# Patient Record
Sex: Female | Born: 1935 | Race: White | Hispanic: No | Marital: Married | Smoking: Never smoker
Health system: Southern US, Community
[De-identification: ages and names within clinical notes are randomized; demographics above are authoritative.]

## PROBLEM LIST (undated history)

## (undated) DIAGNOSIS — M1990 Unspecified osteoarthritis, unspecified site: Secondary | ICD-10-CM

## (undated) DIAGNOSIS — I499 Cardiac arrhythmia, unspecified: Secondary | ICD-10-CM

## (undated) DIAGNOSIS — I82409 Acute embolism and thrombosis of unspecified deep veins of unspecified lower extremity: Secondary | ICD-10-CM

## (undated) DIAGNOSIS — Z972 Presence of dental prosthetic device (complete) (partial): Secondary | ICD-10-CM

## (undated) DIAGNOSIS — I639 Cerebral infarction, unspecified: Secondary | ICD-10-CM

## (undated) DIAGNOSIS — F32A Depression, unspecified: Secondary | ICD-10-CM

## (undated) DIAGNOSIS — I509 Heart failure, unspecified: Secondary | ICD-10-CM

## (undated) DIAGNOSIS — R51 Headache: Secondary | ICD-10-CM

## (undated) DIAGNOSIS — Z950 Presence of cardiac pacemaker: Secondary | ICD-10-CM

## (undated) DIAGNOSIS — E7800 Pure hypercholesterolemia, unspecified: Secondary | ICD-10-CM

## (undated) DIAGNOSIS — I2699 Other pulmonary embolism without acute cor pulmonale: Secondary | ICD-10-CM

## (undated) DIAGNOSIS — F329 Major depressive disorder, single episode, unspecified: Secondary | ICD-10-CM

## (undated) DIAGNOSIS — I10 Essential (primary) hypertension: Secondary | ICD-10-CM

## (undated) DIAGNOSIS — K08109 Complete loss of teeth, unspecified cause, unspecified class: Secondary | ICD-10-CM

## (undated) DIAGNOSIS — S72009A Fracture of unspecified part of neck of unspecified femur, initial encounter for closed fracture: Secondary | ICD-10-CM

## (undated) DIAGNOSIS — N390 Urinary tract infection, site not specified: Secondary | ICD-10-CM

## (undated) HISTORY — PX: JOINT REPLACEMENT: SHX530

## (undated) HISTORY — PX: CRANIOTOMY: SHX93

## (undated) HISTORY — PX: CHOLECYSTECTOMY: SHX55

## (undated) HISTORY — PX: ABDOMINAL HYSTERECTOMY: SHX81

## (undated) HISTORY — PX: CARDIAC CATHETERIZATION: SHX172

## (undated) HISTORY — PX: BLADDER SURGERY: SHX569

## (undated) HISTORY — PX: BRAIN SURGERY: SHX531

## (undated) HISTORY — PX: INSERT / REPLACE / REMOVE PACEMAKER: SUR710

---

## 2004-05-16 DIAGNOSIS — I639 Cerebral infarction, unspecified: Secondary | ICD-10-CM

## 2004-05-16 HISTORY — DX: Cerebral infarction, unspecified: I63.9

## 2004-11-17 ENCOUNTER — Ambulatory Visit: Payer: Self-pay | Admitting: Internal Medicine

## 2005-01-29 ENCOUNTER — Emergency Department: Payer: Self-pay | Admitting: Emergency Medicine

## 2005-02-14 ENCOUNTER — Ambulatory Visit: Payer: Self-pay | Admitting: Oncology

## 2005-02-22 ENCOUNTER — Ambulatory Visit: Payer: Self-pay | Admitting: Physical Medicine & Rehabilitation

## 2005-02-22 ENCOUNTER — Inpatient Hospital Stay (HOSPITAL_COMMUNITY)
Admission: RE | Admit: 2005-02-22 | Discharge: 2005-03-03 | Payer: Self-pay | Admitting: Physical Medicine & Rehabilitation

## 2005-03-03 ENCOUNTER — Inpatient Hospital Stay (HOSPITAL_COMMUNITY): Admission: AD | Admit: 2005-03-03 | Discharge: 2005-03-14 | Payer: Self-pay | Admitting: Internal Medicine

## 2005-05-23 ENCOUNTER — Encounter: Payer: Self-pay | Admitting: Internal Medicine

## 2005-06-16 ENCOUNTER — Encounter: Payer: Self-pay | Admitting: Internal Medicine

## 2005-07-14 ENCOUNTER — Encounter: Payer: Self-pay | Admitting: Internal Medicine

## 2005-08-14 ENCOUNTER — Encounter: Payer: Self-pay | Admitting: Internal Medicine

## 2005-09-13 ENCOUNTER — Encounter: Payer: Self-pay | Admitting: Internal Medicine

## 2005-10-14 ENCOUNTER — Encounter: Payer: Self-pay | Admitting: Internal Medicine

## 2006-05-30 ENCOUNTER — Ambulatory Visit: Payer: Self-pay | Admitting: Internal Medicine

## 2006-06-06 ENCOUNTER — Ambulatory Visit: Payer: Self-pay | Admitting: Internal Medicine

## 2006-07-06 ENCOUNTER — Other Ambulatory Visit: Payer: Self-pay

## 2006-07-06 ENCOUNTER — Inpatient Hospital Stay: Payer: Self-pay | Admitting: Internal Medicine

## 2006-10-05 ENCOUNTER — Ambulatory Visit: Payer: Self-pay | Admitting: Internal Medicine

## 2007-02-13 ENCOUNTER — Ambulatory Visit: Payer: Self-pay | Admitting: Family Medicine

## 2007-02-21 ENCOUNTER — Ambulatory Visit: Payer: Self-pay | Admitting: Internal Medicine

## 2007-05-25 ENCOUNTER — Inpatient Hospital Stay: Payer: Self-pay | Admitting: Internal Medicine

## 2007-05-25 ENCOUNTER — Other Ambulatory Visit: Payer: Self-pay

## 2007-06-14 ENCOUNTER — Ambulatory Visit: Payer: Self-pay | Admitting: Internal Medicine

## 2008-01-10 ENCOUNTER — Emergency Department: Payer: Self-pay

## 2008-01-10 ENCOUNTER — Other Ambulatory Visit: Payer: Self-pay

## 2008-06-04 ENCOUNTER — Emergency Department: Payer: Self-pay

## 2008-07-07 ENCOUNTER — Ambulatory Visit: Payer: Self-pay | Admitting: Urology

## 2008-11-14 ENCOUNTER — Ambulatory Visit: Payer: Self-pay | Admitting: Internal Medicine

## 2009-01-06 ENCOUNTER — Inpatient Hospital Stay: Payer: Self-pay | Admitting: *Deleted

## 2009-02-24 ENCOUNTER — Ambulatory Visit: Payer: Self-pay | Admitting: Internal Medicine

## 2009-05-29 ENCOUNTER — Ambulatory Visit: Payer: Self-pay | Admitting: Internal Medicine

## 2009-06-02 ENCOUNTER — Emergency Department: Payer: Self-pay | Admitting: Emergency Medicine

## 2009-11-24 ENCOUNTER — Ambulatory Visit: Payer: Self-pay | Admitting: Internal Medicine

## 2010-12-17 ENCOUNTER — Ambulatory Visit: Payer: Self-pay | Admitting: Internal Medicine

## 2010-12-30 ENCOUNTER — Ambulatory Visit: Payer: Self-pay | Admitting: Emergency Medicine

## 2010-12-30 DIAGNOSIS — Z01810 Encounter for preprocedural cardiovascular examination: Secondary | ICD-10-CM

## 2011-05-12 ENCOUNTER — Ambulatory Visit: Payer: Self-pay | Admitting: Urology

## 2011-05-17 ENCOUNTER — Ambulatory Visit: Payer: Self-pay | Admitting: Internal Medicine

## 2011-05-24 ENCOUNTER — Ambulatory Visit: Payer: Self-pay | Admitting: Urology

## 2011-05-24 DIAGNOSIS — J45909 Unspecified asthma, uncomplicated: Secondary | ICD-10-CM | POA: Diagnosis not present

## 2011-05-24 DIAGNOSIS — N3030 Trigonitis without hematuria: Secondary | ICD-10-CM | POA: Diagnosis not present

## 2011-05-24 DIAGNOSIS — Z7982 Long term (current) use of aspirin: Secondary | ICD-10-CM | POA: Diagnosis not present

## 2011-05-24 DIAGNOSIS — D414 Neoplasm of uncertain behavior of bladder: Secondary | ICD-10-CM | POA: Diagnosis not present

## 2011-05-24 DIAGNOSIS — Z87891 Personal history of nicotine dependence: Secondary | ICD-10-CM | POA: Diagnosis not present

## 2011-05-24 DIAGNOSIS — I498 Other specified cardiac arrhythmias: Secondary | ICD-10-CM | POA: Diagnosis not present

## 2011-05-24 DIAGNOSIS — Z9981 Dependence on supplemental oxygen: Secondary | ICD-10-CM | POA: Diagnosis not present

## 2011-05-24 DIAGNOSIS — M47812 Spondylosis without myelopathy or radiculopathy, cervical region: Secondary | ICD-10-CM | POA: Diagnosis not present

## 2011-05-24 DIAGNOSIS — G43909 Migraine, unspecified, not intractable, without status migrainosus: Secondary | ICD-10-CM | POA: Diagnosis not present

## 2011-05-24 DIAGNOSIS — M545 Low back pain, unspecified: Secondary | ICD-10-CM | POA: Diagnosis not present

## 2011-05-24 DIAGNOSIS — I10 Essential (primary) hypertension: Secondary | ICD-10-CM | POA: Diagnosis not present

## 2011-05-24 DIAGNOSIS — R3915 Urgency of urination: Secondary | ICD-10-CM | POA: Diagnosis not present

## 2011-05-24 DIAGNOSIS — N390 Urinary tract infection, site not specified: Secondary | ICD-10-CM | POA: Diagnosis not present

## 2011-05-24 DIAGNOSIS — R0601 Orthopnea: Secondary | ICD-10-CM | POA: Diagnosis not present

## 2011-05-24 DIAGNOSIS — Z8679 Personal history of other diseases of the circulatory system: Secondary | ICD-10-CM | POA: Diagnosis not present

## 2011-05-24 DIAGNOSIS — Z79899 Other long term (current) drug therapy: Secondary | ICD-10-CM | POA: Diagnosis not present

## 2011-05-27 DIAGNOSIS — J309 Allergic rhinitis, unspecified: Secondary | ICD-10-CM | POA: Diagnosis not present

## 2011-05-27 DIAGNOSIS — J449 Chronic obstructive pulmonary disease, unspecified: Secondary | ICD-10-CM | POA: Diagnosis not present

## 2011-06-09 ENCOUNTER — Inpatient Hospital Stay: Payer: Self-pay | Admitting: Internal Medicine

## 2011-06-09 DIAGNOSIS — R5383 Other fatigue: Secondary | ICD-10-CM | POA: Diagnosis present

## 2011-06-09 DIAGNOSIS — I2699 Other pulmonary embolism without acute cor pulmonale: Secondary | ICD-10-CM | POA: Diagnosis not present

## 2011-06-09 DIAGNOSIS — S72019A Unspecified intracapsular fracture of unspecified femur, initial encounter for closed fracture: Secondary | ICD-10-CM | POA: Diagnosis not present

## 2011-06-09 DIAGNOSIS — N179 Acute kidney failure, unspecified: Secondary | ICD-10-CM | POA: Diagnosis not present

## 2011-06-09 DIAGNOSIS — Z471 Aftercare following joint replacement surgery: Secondary | ICD-10-CM | POA: Diagnosis not present

## 2011-06-09 DIAGNOSIS — S298XXA Other specified injuries of thorax, initial encounter: Secondary | ICD-10-CM | POA: Diagnosis not present

## 2011-06-09 DIAGNOSIS — N390 Urinary tract infection, site not specified: Secondary | ICD-10-CM | POA: Diagnosis not present

## 2011-06-09 DIAGNOSIS — D649 Anemia, unspecified: Secondary | ICD-10-CM | POA: Diagnosis not present

## 2011-06-09 DIAGNOSIS — R6889 Other general symptoms and signs: Secondary | ICD-10-CM | POA: Diagnosis not present

## 2011-06-09 DIAGNOSIS — J45909 Unspecified asthma, uncomplicated: Secondary | ICD-10-CM | POA: Diagnosis not present

## 2011-06-09 DIAGNOSIS — I10 Essential (primary) hypertension: Secondary | ICD-10-CM | POA: Diagnosis not present

## 2011-06-09 DIAGNOSIS — Z515 Encounter for palliative care: Secondary | ICD-10-CM | POA: Diagnosis not present

## 2011-06-09 DIAGNOSIS — Z86718 Personal history of other venous thrombosis and embolism: Secondary | ICD-10-CM | POA: Diagnosis not present

## 2011-06-09 DIAGNOSIS — J9819 Other pulmonary collapse: Secondary | ICD-10-CM | POA: Diagnosis not present

## 2011-06-09 DIAGNOSIS — R112 Nausea with vomiting, unspecified: Secondary | ICD-10-CM | POA: Diagnosis not present

## 2011-06-09 DIAGNOSIS — A4902 Methicillin resistant Staphylococcus aureus infection, unspecified site: Secondary | ICD-10-CM | POA: Diagnosis present

## 2011-06-09 DIAGNOSIS — Z96649 Presence of unspecified artificial hip joint: Secondary | ICD-10-CM | POA: Diagnosis not present

## 2011-06-09 DIAGNOSIS — N269 Renal sclerosis, unspecified: Secondary | ICD-10-CM | POA: Diagnosis not present

## 2011-06-09 DIAGNOSIS — I69928 Other speech and language deficits following unspecified cerebrovascular disease: Secondary | ICD-10-CM | POA: Diagnosis not present

## 2011-06-09 DIAGNOSIS — B952 Enterococcus as the cause of diseases classified elsewhere: Secondary | ICD-10-CM | POA: Diagnosis present

## 2011-06-09 DIAGNOSIS — Z01818 Encounter for other preprocedural examination: Secondary | ICD-10-CM | POA: Diagnosis not present

## 2011-06-09 DIAGNOSIS — J9600 Acute respiratory failure, unspecified whether with hypoxia or hypercapnia: Secondary | ICD-10-CM | POA: Diagnosis not present

## 2011-06-09 DIAGNOSIS — D72829 Elevated white blood cell count, unspecified: Secondary | ICD-10-CM | POA: Diagnosis present

## 2011-06-09 DIAGNOSIS — I509 Heart failure, unspecified: Secondary | ICD-10-CM | POA: Diagnosis present

## 2011-06-09 DIAGNOSIS — S72009A Fracture of unspecified part of neck of unspecified femur, initial encounter for closed fracture: Secondary | ICD-10-CM | POA: Diagnosis not present

## 2011-06-09 DIAGNOSIS — E785 Hyperlipidemia, unspecified: Secondary | ICD-10-CM | POA: Diagnosis not present

## 2011-06-09 DIAGNOSIS — F411 Generalized anxiety disorder: Secondary | ICD-10-CM | POA: Diagnosis present

## 2011-06-09 DIAGNOSIS — Z7982 Long term (current) use of aspirin: Secondary | ICD-10-CM | POA: Diagnosis not present

## 2011-06-09 DIAGNOSIS — R0902 Hypoxemia: Secondary | ICD-10-CM | POA: Diagnosis not present

## 2011-06-09 DIAGNOSIS — R000 Tachycardia, unspecified: Secondary | ICD-10-CM | POA: Diagnosis not present

## 2011-06-09 DIAGNOSIS — J189 Pneumonia, unspecified organism: Secondary | ICD-10-CM | POA: Diagnosis not present

## 2011-06-09 DIAGNOSIS — R0602 Shortness of breath: Secondary | ICD-10-CM | POA: Diagnosis not present

## 2011-06-09 DIAGNOSIS — F329 Major depressive disorder, single episode, unspecified: Secondary | ICD-10-CM | POA: Diagnosis present

## 2011-06-09 DIAGNOSIS — I498 Other specified cardiac arrhythmias: Secondary | ICD-10-CM | POA: Diagnosis present

## 2011-06-09 DIAGNOSIS — S72033A Displaced midcervical fracture of unspecified femur, initial encounter for closed fracture: Secondary | ICD-10-CM | POA: Diagnosis not present

## 2011-06-09 DIAGNOSIS — I69998 Other sequelae following unspecified cerebrovascular disease: Secondary | ICD-10-CM | POA: Diagnosis not present

## 2011-06-09 DIAGNOSIS — D500 Iron deficiency anemia secondary to blood loss (chronic): Secondary | ICD-10-CM | POA: Diagnosis present

## 2011-06-09 LAB — CBC
HCT: 39 % (ref 35.0–47.0)
HGB: 12.9 g/dL (ref 12.0–16.0)
MCH: 26.5 pg (ref 26.0–34.0)
MCHC: 33.1 g/dL (ref 32.0–36.0)

## 2011-06-09 LAB — URINALYSIS, COMPLETE
Bilirubin,UR: NEGATIVE
Ketone: NEGATIVE
Ph: 5 (ref 4.5–8.0)
Protein: NEGATIVE
RBC,UR: 31 /HPF (ref 0–5)
WBC UR: 176 /HPF (ref 0–5)

## 2011-06-09 LAB — COMPREHENSIVE METABOLIC PANEL
Albumin: 4.1 g/dL (ref 3.4–5.0)
Alkaline Phosphatase: 74 U/L (ref 50–136)
Bilirubin,Total: 0.7 mg/dL (ref 0.2–1.0)
Glucose: 130 mg/dL — ABNORMAL HIGH (ref 65–99)
SGOT(AST): 25 U/L (ref 15–37)
SGPT (ALT): 14 U/L
Total Protein: 7.2 g/dL (ref 6.4–8.2)

## 2011-06-09 LAB — APTT: Activated PTT: 27.5 secs (ref 23.6–35.9)

## 2011-06-10 LAB — CBC WITH DIFFERENTIAL/PLATELET
Basophil #: 0 10*3/uL (ref 0.0–0.1)
Basophil %: 0.3 %
Eosinophil #: 0.2 10*3/uL (ref 0.0–0.7)
HGB: 11.4 g/dL — ABNORMAL LOW (ref 12.0–16.0)
Lymphocyte %: 10.3 %
MCH: 26.3 pg (ref 26.0–34.0)
MCHC: 33 g/dL (ref 32.0–36.0)
Monocyte #: 0.3 10*3/uL (ref 0.0–0.7)
Neutrophil #: 7.2 10*3/uL — ABNORMAL HIGH (ref 1.4–6.5)
Neutrophil %: 83.4 %
RDW: 13.9 % (ref 11.5–14.5)

## 2011-06-10 LAB — BASIC METABOLIC PANEL
Anion Gap: 9 (ref 7–16)
BUN: 13 mg/dL (ref 7–18)
Calcium, Total: 8.4 mg/dL — ABNORMAL LOW (ref 8.5–10.1)
EGFR (African American): >60
EGFR (Non-African Amer.): >60
Glucose: 129 mg/dL — ABNORMAL HIGH (ref 65–99)
Osmolality: 279 (ref 275–301)
Potassium: 4.4 mmol/L (ref 3.5–5.1)

## 2011-06-10 LAB — HEMOGLOBIN A1C: Hemoglobin A1C: 5.5 % (ref 4.2–6.3)

## 2011-06-11 DIAGNOSIS — R000 Tachycardia, unspecified: Secondary | ICD-10-CM

## 2011-06-11 LAB — COMPREHENSIVE METABOLIC PANEL
Anion Gap: 9 (ref 7–16)
Calcium, Total: 8 mg/dL — ABNORMAL LOW (ref 8.5–10.1)
Chloride: 100 mmol/L (ref 98–107)
Co2: 27 mmol/L (ref 21–32)
EGFR (African American): 51 — ABNORMAL LOW
Potassium: 4.3 mmol/L (ref 3.5–5.1)
SGOT(AST): 29 U/L (ref 15–37)
SGPT (ALT): 11 U/L — ABNORMAL LOW
Sodium: 136 mmol/L (ref 136–145)

## 2011-06-11 LAB — CBC WITH DIFFERENTIAL/PLATELET
Eosinophil %: 5.8 %
HCT: 30.4 % — ABNORMAL LOW (ref 35.0–47.0)
Lymphocyte #: 0.6 10*3/uL — ABNORMAL LOW (ref 1.0–3.6)
MCV: 81 fL (ref 80–100)
Monocyte %: 4.5 %
Neutrophil #: 6.7 10*3/uL — ABNORMAL HIGH (ref 1.4–6.5)
RBC: 3.74 10*6/uL — ABNORMAL LOW (ref 3.80–5.20)
RDW: 13.5 % (ref 11.5–14.5)
WBC: 8.2 10*3/uL (ref 3.6–11.0)

## 2011-06-11 LAB — BASIC METABOLIC PANEL
Anion Gap: 8 (ref 7–16)
BUN: 14 mg/dL (ref 7–18)
Co2: 27 mmol/L (ref 21–32)
Creatinine: 1.08 mg/dL (ref 0.60–1.30)
EGFR (African American): >60
EGFR (Non-African Amer.): 52 — ABNORMAL LOW
Potassium: 4.4 mmol/L (ref 3.5–5.1)
Sodium: 139 mmol/L (ref 136–145)

## 2011-06-11 LAB — CK: CK, Total: 380 U/L — ABNORMAL HIGH (ref 21–215)

## 2011-06-12 LAB — BASIC METABOLIC PANEL
BUN: 17 mg/dL (ref 7–18)
EGFR (Non-African Amer.): 44 — ABNORMAL LOW
Glucose: 119 mg/dL — ABNORMAL HIGH (ref 65–99)
Potassium: 3.9 mmol/L (ref 3.5–5.1)
Sodium: 139 mmol/L (ref 136–145)

## 2011-06-12 LAB — TROPONIN I
Troponin-I: 0.25 ng/mL — ABNORMAL HIGH
Troponin-I: 0.33 ng/mL — ABNORMAL HIGH

## 2011-06-12 LAB — HEMOGLOBIN: HGB: 10.5 g/dL — ABNORMAL LOW (ref 12.0–16.0)

## 2011-06-13 LAB — BASIC METABOLIC PANEL
Anion Gap: 12 (ref 7–16)
Calcium, Total: 8.5 mg/dL (ref 8.5–10.1)
EGFR (African American): 32 — ABNORMAL LOW
EGFR (Non-African Amer.): 27 — ABNORMAL LOW
Glucose: 125 mg/dL — ABNORMAL HIGH (ref 65–99)
Osmolality: 293 (ref 275–301)
Sodium: 142 mmol/L (ref 136–145)

## 2011-06-13 LAB — CBC WITH DIFFERENTIAL/PLATELET
Basophil %: 0.1 %
HCT: 28.2 % — ABNORMAL LOW (ref 35.0–47.0)
HGB: 9.3 g/dL — ABNORMAL LOW (ref 12.0–16.0)
Lymphocyte #: 0.8 10*3/uL — ABNORMAL LOW (ref 1.0–3.6)
MCH: 26.6 pg (ref 26.0–34.0)
MCHC: 33.1 g/dL (ref 32.0–36.0)
MCV: 80 fL (ref 80–100)
Monocyte #: 0.8 10*3/uL — ABNORMAL HIGH (ref 0.0–0.7)
Neutrophil #: 11 10*3/uL — ABNORMAL HIGH (ref 1.4–6.5)
RDW: 13.4 % (ref 11.5–14.5)

## 2011-06-13 LAB — APTT: Activated PTT: >160 secs (ref 23.6–35.9)

## 2011-06-13 LAB — URINE CULTURE

## 2011-06-13 LAB — MAGNESIUM: Magnesium: 1.7 mg/dL — ABNORMAL LOW

## 2011-06-14 ENCOUNTER — Encounter (HOSPITAL_COMMUNITY): Payer: Self-pay | Admitting: *Deleted

## 2011-06-14 ENCOUNTER — Inpatient Hospital Stay (HOSPITAL_COMMUNITY)
Admission: AD | Admit: 2011-06-14 | Discharge: 2011-06-23 | DRG: 175 | Disposition: A | Discharge status: Skilled Nursing Facility | Payer: Medicare Other | Source: Other Acute Inpatient Hospital | Attending: Internal Medicine | Admitting: Internal Medicine

## 2011-06-14 DIAGNOSIS — B952 Enterococcus as the cause of diseases classified elsewhere: Secondary | ICD-10-CM | POA: Diagnosis present

## 2011-06-14 DIAGNOSIS — S7291XA Unspecified fracture of right femur, initial encounter for closed fracture: Secondary | ICD-10-CM | POA: Diagnosis present

## 2011-06-14 DIAGNOSIS — Z90710 Acquired absence of both cervix and uterus: Secondary | ICD-10-CM | POA: Diagnosis not present

## 2011-06-14 DIAGNOSIS — Z7901 Long term (current) use of anticoagulants: Secondary | ICD-10-CM

## 2011-06-14 DIAGNOSIS — S72033A Displaced midcervical fracture of unspecified femur, initial encounter for closed fracture: Secondary | ICD-10-CM | POA: Diagnosis not present

## 2011-06-14 DIAGNOSIS — N390 Urinary tract infection, site not specified: Secondary | ICD-10-CM | POA: Diagnosis present

## 2011-06-14 DIAGNOSIS — J189 Pneumonia, unspecified organism: Secondary | ICD-10-CM | POA: Diagnosis present

## 2011-06-14 DIAGNOSIS — E538 Deficiency of other specified B group vitamins: Secondary | ICD-10-CM | POA: Diagnosis present

## 2011-06-14 DIAGNOSIS — Z86718 Personal history of other venous thrombosis and embolism: Secondary | ICD-10-CM | POA: Diagnosis not present

## 2011-06-14 DIAGNOSIS — S72009A Fracture of unspecified part of neck of unspecified femur, initial encounter for closed fracture: Secondary | ICD-10-CM | POA: Diagnosis not present

## 2011-06-14 DIAGNOSIS — R0902 Hypoxemia: Secondary | ICD-10-CM | POA: Diagnosis present

## 2011-06-14 DIAGNOSIS — M25559 Pain in unspecified hip: Secondary | ICD-10-CM | POA: Diagnosis not present

## 2011-06-14 DIAGNOSIS — E876 Hypokalemia: Secondary | ICD-10-CM | POA: Diagnosis not present

## 2011-06-14 DIAGNOSIS — Z471 Aftercare following joint replacement surgery: Secondary | ICD-10-CM | POA: Diagnosis not present

## 2011-06-14 DIAGNOSIS — G9341 Metabolic encephalopathy: Secondary | ICD-10-CM | POA: Diagnosis present

## 2011-06-14 DIAGNOSIS — Z882 Allergy status to sulfonamides status: Secondary | ICD-10-CM

## 2011-06-14 DIAGNOSIS — J9600 Acute respiratory failure, unspecified whether with hypoxia or hypercapnia: Secondary | ICD-10-CM | POA: Diagnosis not present

## 2011-06-14 DIAGNOSIS — N179 Acute kidney failure, unspecified: Secondary | ICD-10-CM | POA: Diagnosis not present

## 2011-06-14 DIAGNOSIS — Z886 Allergy status to analgesic agent status: Secondary | ICD-10-CM

## 2011-06-14 DIAGNOSIS — D62 Acute posthemorrhagic anemia: Secondary | ICD-10-CM | POA: Diagnosis not present

## 2011-06-14 DIAGNOSIS — I69998 Other sequelae following unspecified cerebrovascular disease: Secondary | ICD-10-CM

## 2011-06-14 DIAGNOSIS — Z8781 Personal history of (healed) traumatic fracture: Secondary | ICD-10-CM | POA: Diagnosis not present

## 2011-06-14 DIAGNOSIS — I69928 Other speech and language deficits following unspecified cerebrovascular disease: Secondary | ICD-10-CM

## 2011-06-14 DIAGNOSIS — Z9981 Dependence on supplemental oxygen: Secondary | ICD-10-CM | POA: Diagnosis not present

## 2011-06-14 DIAGNOSIS — R5381 Other malaise: Secondary | ICD-10-CM | POA: Diagnosis present

## 2011-06-14 DIAGNOSIS — Z96649 Presence of unspecified artificial hip joint: Secondary | ICD-10-CM | POA: Diagnosis not present

## 2011-06-14 DIAGNOSIS — D638 Anemia in other chronic diseases classified elsewhere: Secondary | ICD-10-CM | POA: Diagnosis present

## 2011-06-14 DIAGNOSIS — R609 Edema, unspecified: Secondary | ICD-10-CM | POA: Diagnosis not present

## 2011-06-14 DIAGNOSIS — D649 Anemia, unspecified: Secondary | ICD-10-CM | POA: Diagnosis not present

## 2011-06-14 DIAGNOSIS — Z5189 Encounter for other specified aftercare: Secondary | ICD-10-CM | POA: Diagnosis not present

## 2011-06-14 DIAGNOSIS — I2789 Other specified pulmonary heart diseases: Secondary | ICD-10-CM | POA: Diagnosis present

## 2011-06-14 DIAGNOSIS — Z9889 Other specified postprocedural states: Secondary | ICD-10-CM

## 2011-06-14 DIAGNOSIS — I2699 Other pulmonary embolism without acute cor pulmonale: Principal | ICD-10-CM | POA: Diagnosis present

## 2011-06-14 DIAGNOSIS — T4275XA Adverse effect of unspecified antiepileptic and sedative-hypnotic drugs, initial encounter: Secondary | ICD-10-CM | POA: Diagnosis present

## 2011-06-14 DIAGNOSIS — I319 Disease of pericardium, unspecified: Secondary | ICD-10-CM | POA: Diagnosis not present

## 2011-06-14 DIAGNOSIS — J4540 Moderate persistent asthma, uncomplicated: Secondary | ICD-10-CM

## 2011-06-14 DIAGNOSIS — R4182 Altered mental status, unspecified: Secondary | ICD-10-CM | POA: Diagnosis present

## 2011-06-14 DIAGNOSIS — Z9181 History of falling: Secondary | ICD-10-CM | POA: Diagnosis not present

## 2011-06-14 DIAGNOSIS — I517 Cardiomegaly: Secondary | ICD-10-CM | POA: Diagnosis not present

## 2011-06-14 DIAGNOSIS — N269 Renal sclerosis, unspecified: Secondary | ICD-10-CM | POA: Diagnosis not present

## 2011-06-14 DIAGNOSIS — Z515 Encounter for palliative care: Secondary | ICD-10-CM | POA: Diagnosis not present

## 2011-06-14 DIAGNOSIS — I509 Heart failure, unspecified: Secondary | ICD-10-CM | POA: Diagnosis not present

## 2011-06-14 DIAGNOSIS — G319 Degenerative disease of nervous system, unspecified: Secondary | ICD-10-CM | POA: Diagnosis not present

## 2011-06-14 DIAGNOSIS — J4520 Mild intermittent asthma, uncomplicated: Secondary | ICD-10-CM

## 2011-06-14 DIAGNOSIS — I10 Essential (primary) hypertension: Secondary | ICD-10-CM | POA: Diagnosis not present

## 2011-06-14 DIAGNOSIS — J9691 Respiratory failure, unspecified with hypoxia: Secondary | ICD-10-CM | POA: Diagnosis present

## 2011-06-14 HISTORY — DX: Heart failure, unspecified: I50.9

## 2011-06-14 HISTORY — DX: Headache: R51

## 2011-06-14 HISTORY — DX: Acute embolism and thrombosis of unspecified deep veins of unspecified lower extremity: I82.409

## 2011-06-14 HISTORY — DX: Depression, unspecified: F32.A

## 2011-06-14 HISTORY — DX: Other pulmonary embolism without acute cor pulmonale: I26.99

## 2011-06-14 HISTORY — DX: Cerebral infarction, unspecified: I63.9

## 2011-06-14 HISTORY — DX: Major depressive disorder, single episode, unspecified: F32.9

## 2011-06-14 LAB — APTT: Activated PTT: >160 secs (ref 23.6–35.9)

## 2011-06-14 LAB — CBC WITH DIFFERENTIAL/PLATELET
Basophil #: 0 10*3/uL (ref 0.0–0.1)
Eosinophil #: 0 10*3/uL (ref 0.0–0.7)
Eosinophil #: 0 10*3/uL (ref 0.0–0.7)
Eosinophil %: 0.3 %
HCT: 22 % — ABNORMAL LOW (ref 35.0–47.0)
HCT: 19.8 % — ABNORMAL LOW (ref 35.0–47.0)
HGB: 6.6 g/dL — ABNORMAL LOW (ref 12.0–16.0)
Lymphocyte #: 0.5 10*3/uL — ABNORMAL LOW (ref 1.0–3.6)
Lymphocyte #: 0.9 10*3/uL — ABNORMAL LOW (ref 1.0–3.6)
MCH: 26.9 pg (ref 26.0–34.0)
MCHC: 32.9 g/dL (ref 32.0–36.0)
MCHC: 33.2 g/dL (ref 32.0–36.0)
MCV: 82 fL (ref 80–100)
MCV: 81 fL (ref 80–100)
Monocyte #: 0.3 10*3/uL (ref 0.0–0.7)
Monocyte #: 0.5 10*3/uL (ref 0.0–0.7)
Monocyte %: 4.3 %
Neutrophil #: 9.2 10*3/uL — ABNORMAL HIGH (ref 1.4–6.5)
Neutrophil #: 9.2 10*3/uL — ABNORMAL HIGH (ref 1.4–6.5)
Neutrophil %: 86.6 %
Platelet: 232 10*3/uL (ref 150–440)
RBC: 2.44 10*6/uL — ABNORMAL LOW (ref 3.80–5.20)
RDW: 13.1 % (ref 11.5–14.5)
RDW: 13.5 % (ref 11.5–14.5)
WBC: 10.7 10*3/uL (ref 3.6–11.0)

## 2011-06-14 LAB — CBC
HCT: 23.7 % — ABNORMAL LOW (ref 36.0–46.0)
Hemoglobin: 8.2 g/dL — ABNORMAL LOW (ref 12.0–15.0)
MCV: 79.3 fL (ref 78.0–100.0)
RBC: 2.99 MIL/uL — ABNORMAL LOW (ref 3.87–5.11)
WBC: 12.1 10*3/uL — ABNORMAL HIGH (ref 4.0–10.5)

## 2011-06-14 LAB — PROTIME-INR: INR: 1.37 (ref 0.00–1.49)

## 2011-06-14 LAB — BASIC METABOLIC PANEL
Anion Gap: 13 (ref 7–16)
BUN: 59 mg/dL — ABNORMAL HIGH (ref 7–18)
Chloride: 100 mmol/L (ref 98–107)
Osmolality: 296 (ref 275–301)
Potassium: 4.3 mmol/L (ref 3.5–5.1)

## 2011-06-14 LAB — PATHOLOGY REPORT

## 2011-06-14 LAB — MRSA PCR SCREENING: MRSA by PCR: POSITIVE — AB

## 2011-06-14 LAB — COMPREHENSIVE METABOLIC PANEL
AST: 89 U/L — ABNORMAL HIGH (ref 0–37)
Albumin: 2.1 g/dL — ABNORMAL LOW (ref 3.5–5.2)
Alkaline Phosphatase: 39 U/L (ref 39–117)
Chloride: 95 mEq/L — ABNORMAL LOW (ref 96–112)
Potassium: 3.9 mEq/L (ref 3.5–5.1)
Total Bilirubin: 0.5 mg/dL (ref 0.3–1.2)

## 2011-06-14 LAB — PHOSPHORUS: Phosphorus: 4.1 mg/dL (ref 2.3–4.6)

## 2011-06-14 LAB — HEMOGLOBIN A1C: Hemoglobin A1C: 5.5 % (ref 4.2–6.3)

## 2011-06-14 MED ORDER — MUPIROCIN 2 % EX OINT
1.0000 "application " | TOPICAL_OINTMENT | Freq: Two times a day (BID) | CUTANEOUS | Status: AC
  Administered 2011-06-15 – 2011-06-19 (×10): 1 via NASAL
  Filled 2011-06-14: qty 22

## 2011-06-14 MED ORDER — ACETAMINOPHEN 650 MG RE SUPP: 650.0000 mg | Freq: Four times a day (QID) | RECTAL | Status: DC | PRN

## 2011-06-14 MED ORDER — ACETAMINOPHEN 325 MG PO TABS
650.0000 mg | ORAL_TABLET | Freq: Four times a day (QID) | ORAL | Status: DC | PRN
  Administered 2011-06-15 – 2011-06-23 (×15): 650 mg via ORAL
  Filled 2011-06-14 (×15): qty 2

## 2011-06-14 MED ORDER — MONTELUKAST SODIUM 10 MG PO TABS
10.0000 mg | ORAL_TABLET | Freq: Every day | ORAL | Status: DC
  Administered 2011-06-14: 10 mg via ORAL
  Filled 2011-06-14 (×7): qty 1

## 2011-06-14 MED ORDER — CHLORHEXIDINE GLUCONATE CLOTH 2 % EX PADS
6.0000 | MEDICATED_PAD | Freq: Every day | CUTANEOUS | Status: AC
  Administered 2011-06-15 – 2011-06-18 (×4): 6 via TOPICAL

## 2011-06-14 MED ORDER — SODIUM CHLORIDE 0.9 % IV SOLN
INTRAVENOUS | Status: DC
  Administered 2011-06-14 – 2011-06-15 (×3): via INTRAVENOUS
  Administered 2011-06-16: 1000 mL via INTRAVENOUS
  Administered 2011-06-17 – 2011-06-18 (×2): via INTRAVENOUS
  Administered 2011-06-18: 75 mL/h via INTRAVENOUS

## 2011-06-14 MED ORDER — PANTOPRAZOLE SODIUM 40 MG PO TBEC
40.0000 mg | DELAYED_RELEASE_TABLET | Freq: Every day | ORAL | Status: DC
  Administered 2011-06-15: 40 mg via ORAL
  Filled 2011-06-14: qty 1

## 2011-06-14 MED ORDER — ALBUTEROL SULFATE HFA 108 (90 BASE) MCG/ACT IN AERS: 2.0000 | INHALATION_SPRAY | Freq: Four times a day (QID) | RESPIRATORY_TRACT | Status: DC | PRN

## 2011-06-14 MED ORDER — ONDANSETRON HCL 4 MG/2ML IJ SOLN: 4.0000 mg | Freq: Four times a day (QID) | INTRAMUSCULAR | Status: DC | PRN

## 2011-06-14 MED ORDER — ENOXAPARIN SODIUM 30 MG/0.3ML ~~LOC~~ SOLN
30.0000 mg | Freq: Every day | SUBCUTANEOUS | Status: DC
  Administered 2011-06-15: 30 mg via SUBCUTANEOUS
  Filled 2011-06-14: qty 0.3

## 2011-06-14 MED ORDER — PHENAZOPYRIDINE HCL 200 MG PO TABS
200.0000 mg | ORAL_TABLET | Freq: Three times a day (TID) | ORAL | Status: DC
  Administered 2011-06-14 – 2011-06-15 (×2): 200 mg via ORAL
  Filled 2011-06-14 (×4): qty 1

## 2011-06-14 MED ORDER — ONDANSETRON HCL 4 MG PO TABS: 4.0000 mg | ORAL_TABLET | Freq: Four times a day (QID) | ORAL | Status: DC | PRN

## 2011-06-14 NOTE — Progress Notes (Addendum)
ANTIBIOTIC CONSULT NOTE - INITIAL  Pharmacy Consult for Vanco/Zosyn/Levaquin Indication: pneumonia/UTI  Allergies  Allergen Reactions  . Morphine And Related Other (See Comments)    unknown  . Sulfur Other (See Comments)    unknown  . Vicodin (Hydrocodone-Acetaminophen) Other (See Comments)    unknown    Patient Measurements: Ht ~ 65 inches Wt 83.9 kg  Vital Signs: Temp: 98.3 F (36.8 C) (01/29 2100) Temp src: Oral (01/29 2100) BP: 109/41 mmHg (01/29 2300) Pulse Rate: 67  (01/29 2300) Intake/Output from previous day:   Intake/Output from this shift:    Labs:  Basename 06/14/11 2228  WBC 12.1*  HGB 8.2*  PLT 273  LABCREA --  CREATININE 1.74*   Estimated CrCl ~30 ml/min  Microbiology: Recent Results (from the past 720 hour(s))  MRSA PCR SCREENING     Status: Abnormal   Collection Time   06/14/11  8:20 PM      Component Value Range Status Comment   MRSA by PCR POSITIVE (*) NEGATIVE  Final    Culture from [place]: 1/24 Urine - Enterococcus faecalis sensitive to amp/zyvox/nitrofurantoin/tigecycline and MRSA sensitive to Vanco/Zyvox/Nitrofurantoin/Tetracycline/Tigecycline/Gent/Bactrim  Medical History: Past Medical History  Diagnosis Date  . Asthma   . Headache   . PE (pulmonary embolism)   . DVT (deep venous thrombosis)   . Depression   . CHF (congestive heart failure)   . On home oxygen therapy   . Stroke   h/o hemorrhagic CVA requiring craniotomy  Medications:  Prescriptions prior to admission  Medication Sig Dispense Refill  . albuterol (PROVENTIL HFA;VENTOLIN HFA) 108 (90 BASE) MCG/ACT inhaler Inhale 2 puffs into the lungs every 6 (six) hours as needed. For shortness of breath      . amLODipine (NORVASC) 2.5 MG tablet Take 2.5 mg by mouth daily.      Marland Kitchen atorvastatin (LIPITOR) 10 MG tablet Take 10 mg by mouth daily.      . ciprofloxacin (CIPRO) 500 MG tablet Take 500 mg by mouth 2 (two) times daily.      Marland Kitchen FLUoxetine (PROZAC) 20 MG capsule Take 20  mg by mouth daily.      Marland Kitchen HYDROcodone-acetaminophen (VICODIN) 5-500 MG per tablet Take 1 tablet by mouth every 6 (six) hours as needed. For pain      . irbesartan-hydrochlorothiazide (AVALIDE) 300-12.5 MG per tablet Take 1 tablet by mouth daily.      . montelukast (SINGULAIR) 10 MG tablet Take 10 mg by mouth at bedtime.      . phenazopyridine (PYRIDIUM) 200 MG tablet Take 200 mg by mouth 3 (three) times daily.      . Trospium Chloride (SANCTURA XR) 60 MG CP24 Take 1 capsule by mouth daily.      Marland Kitchen zolpidem (AMBIEN) 10 MG tablet Take 10 mg by mouth at bedtime as needed. For anxiety/insomnia       Assessment: 78 y.o. female transferred from [place]. Records from [place] indicate pt admitted s/p fall with R femur neck fracture, underwent hemiarthroplasty 1/25. CT scan on 1/26 shows PE- received heparin in [place] ICU but stopped before transfer to [facility].  Pt also with acute renal failure. Urine culture are [place] with enterococcus faecalis and MRSA.  S400poke with [place] pharmacist - Pt was on Zosyn, Tigecycline, Vanco, and Levaquin there since 1/28. Last dose of Vanco 750mg  q24h was given 1/29 at 1400. Last dose of Levaquin given 1/28 a.m.  Goal of Therapy:  Vancomycin 15-20 mcg/ml  Plan:  1. Zosyn 3.375gm IV q8h. 2.  Vancomycin 750mg  IV q24h. Next dose due 1/30 at 1400. 3. Levaquin 750mg  IV q48h. Next dose 1/30 a.m. 4. Will f/u blood cultures, renal function, and vancomycin trough at [facility].  Lavonia Dana 06/14/2011,11:31 PM

## 2011-06-14 NOTE — Progress Notes (Signed)
PCP:   Provider Not In System   Chief Complaint:  Transferred from [facility] as per family request and for higher level of care.  HPI: Most of the history available from the patient family at bedside and paperwork from [facility]. Pt is a poor historian, she is confused and is not oriented to place and time, but she is able to answer a few simple questions. 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 CHF, asthma, tripped and fell in a parking lot, and was initially seen at [facility] on 1/24. She was found to have right femur neck fracture . She was medically cleared and underwent right hemiarthroplasty on 1/25. On 1/26, pt developed respiratory distress and went into acute hypoxic respiratory failure, she underwent ct angiogram of the chest which showed bilateral pulmonary emboli, bilateral infiltrates and bilateral pleural effusions. She was transferred to ICU, and was started on anticoagulation. Meanwhile she developed MRSA  UTI, acute renal failure, and she went into SVT, with troponin leak.  Her respiratory status did not improve and she was maintained on venti mask till 1/29 and anticoagulation was stopped, thinking she might have fat embolism in view of her femur fracture and hemiarthroplasty.  As per the family and notes , pt got demerol via IV route on the night of surgery and since then her mental status has worsened.  As per family request and for higher level of care she was transferred to [facility]. Dr Lavinia Sharps( MD at [facility]) initially called Dr Allen Kell to accept the transfer, but patient was deferred to hospitalist service to take over the patient care for further evaluation and management.    Review of Systems:  Could not be obtained as pt is a poor historian. .  Past Medical History: Past Medical History  Diagnosis Date  . Asthma   . Headache   . PE (pulmonary  embolism)   . DVT (deep venous thrombosis)   . Depression   . CHF (congestive heart failure)   . On home oxygen therapy   . Stroke    Past Surgical History  Procedure Date  . Abdominal hysterectomy   . Craniotomy   . Bladder surgery     Medications: Prior to Admission medications   Medication Sig Start Date End Date Taking? Authorizing Provider  albuterol (PROVENTIL HFA;VENTOLIN HFA) 108 (90 BASE) MCG/ACT inhaler Inhale 2 puffs into the lungs every 6 (six) hours as needed. For shortness of breath   Yes Historical Provider, MD  amLODipine (NORVASC) 2.5 MG tablet Take 2.5 mg by mouth daily.   Yes Historical Provider, MD  atorvastatin (LIPITOR) 10 MG tablet Take 10 mg by mouth daily.   Yes Historical Provider, MD  ciprofloxacin (CIPRO) 500 MG tablet Take 500 mg by mouth 2 (two) times daily.   Yes Historical Provider, MD  FLUoxetine (PROZAC) 20 MG capsule Take 20 mg by mouth daily.   Yes Historical Provider, MD  HYDROcodone-acetaminophen (VICODIN) 5-500 MG per tablet Take 1 tablet by mouth every 6 (six) hours as needed. For pain   Yes Historical Provider, MD  irbesartan-hydrochlorothiazide (AVALIDE) 300-12.5 MG per tablet Take 1 tablet by mouth daily.   Yes Historical Provider, MD  montelukast (SINGULAIR) 10 MG tablet Take 10 mg by mouth at bedtime.   Yes Historical Provider, MD  phenazopyridine (PYRIDIUM) 200 MG tablet Take 200 mg by mouth 3 (three) times daily.   Yes Historical Provider,  MD  Trospium Chloride (SANCTURA XR) 60 MG CP24 Take 1 capsule by mouth daily.   Yes Historical Provider, MD  zolpidem (AMBIEN) 10 MG tablet Take 10 mg by mouth at bedtime as needed. For anxiety/insomnia   Yes Historical Provider, MD    Allergies:   Allergies  Allergen Reactions  . Morphine And Related Other (See Comments)    unknown  . Sulfur Other (See Comments)    unknown  . Vicodin (Hydrocodone-Acetaminophen) Other (See Comments)    unknown    Social History:  does not have a smoking  history on file. She does not have any smokeless tobacco history on file. She reports that she does not drink alcohol or use illicit drugs. .  Family History: No family history on file.  Physical Exam: Filed Vitals:   06/14/11 2100  BP: 103/41  Pulse: 67  Temp: 98.3 F (36.8 C)  TempSrc: Oral  Resp: 21  SpO2: 96%   Constitutional: Vital signs reviewed.  Patient is a well-developed and well-nourished in no acute distress and cooperative with exam. Alert but confused. She is not oriented to place or time. She is able to recognize her family members and answer simple question and follow simple commands.  Head: Normocephalic and atraumatic Mouth: no erythema or exudates, dry mucous membranes Eyes: PERRL , conjunctivae normal, No scleral icterus.  Neck: Supple, Trachea midline normal ROM, No JVD, Cardiovascular: RRR, S1 normal, S2 normal, no MRG, Pulmonary/Chest: decreased air entry at bases and scattered rhonchi heard. No wheezing heard.  Abdominal: Soft. Non-tender, non-distended, bowel sounds are normal, no masses, organomegaly, or guarding present.  Musculoskeletal: RLL wrapped in bandage. LLE  No pedal edema.  Neurological: alert  But confused,  Complete neuro exam could not be performed secondary to pt's mental status.  Skin: Warm, dry and intact. No rash, cyanosis, or clubbing.        Labs on Admission:  No results found for this basename: NA:2,K:2,CL:2,CO2:2,GLUCOSE:2,BUN:2,CREATININE:2,CALCIUM:2,MG:2,PHOS:2 in the last 72 hours No results found for this basename: AST:2,ALT:2,ALKPHOS:2,BILITOT:2,PROT:2,ALBUMIN:2 in the last 72 hours No results found for this basename: LIPASE:2,AMYLASE:2 in the last 72 hours No results found for this basename: WBC:2,NEUTROABS:2,HGB:2,HCT:2,MCV:2,PLT:2 in the last 72 hours No results found for this basename: CKTOTAL:3,CKMB:3,CKMBINDEX:3,TROPONINI:3 in the last 72 hours No results found for this basename: TSH,T4TOTAL,FREET3,T3FREE,THYROIDAB in  the last 72 hours No results found for this basename: VITAMINB12:2,FOLATE:2,FERRITIN:2,TIBC:2,IRON:2,RETICCTPCT:2 in the last 72 hours  Radiological Exams on Admission: No results found.  Assessment/Plan Present on Admission:  .Altered mental status: most likely secondary to metabolic encephalopathy vs secondary to sedative medications. Will get a CT head without contrast, and if there is no acute pathology, will call neurology as needed. Avoid sedatives.  .Pulmonary embolism vs Fat embolism: Currently not on anticoagulation, will get a ct head without contrast, if there is no indication of bleeding,  Will restart her on iv anticoagulation. Venous dopplers of the lower extremities negative She is currently saturating 96% on 3 liters of nasal canula oxygen.  Wynonia Hazard Respiratory failure with hypoxia: secondary to a combination of pulmonary embolism, bilateral pneumonia, and pulmonary edema. Started the patient on broad spectrum antibiotics. Will repeat CXR  In am.  .Pneumonia: treatment as per #2.  Marland KitchenAcute renal failure: most likely contrast induced vs pre renal secondary to dehydration and infection, but will get a renal ultrasound to evaluate for any obstructive causes. Daily I& O's , UA ordered. Will call renal consult , if  Renal function doesn't improve.  .Femur fracture, right: s/p  hemiarthroplasty.  . Enterococcus / MRSA UTI (lower urinary tract infection): started the patient on IV vancomycin and iv zosyn as per the sensitivities.  Anemia: secondary to combination of blood loss anemia from surgery and chronic disease. Will get anemia panel and stool for occult blood.  Pulmonary Edema/ h/o SVT/ Troponin Leak: Questionable h/o CHF, with unknown left ventricular EF. 2D echocardiogram ordered to evaluate the right heart strain and rule out PFO. 12 lead EKG ordered, she appears to be in sinus on tele monitoring.   Further recommendations as per her clinical course  She is full code as per family      Time spent on this patient including examination and decision-making process: 85 minutes.  Zadrian Mccauley [phone] 06/14/2011, 10:03 PM

## 2011-06-15 ENCOUNTER — Inpatient Hospital Stay (HOSPITAL_COMMUNITY): Payer: Medicare Other

## 2011-06-15 ENCOUNTER — Encounter (HOSPITAL_COMMUNITY): Payer: Self-pay | Admitting: *Deleted

## 2011-06-15 DIAGNOSIS — I69998 Other sequelae following unspecified cerebrovascular disease: Secondary | ICD-10-CM | POA: Diagnosis not present

## 2011-06-15 DIAGNOSIS — R4182 Altered mental status, unspecified: Secondary | ICD-10-CM | POA: Diagnosis not present

## 2011-06-15 DIAGNOSIS — I509 Heart failure, unspecified: Secondary | ICD-10-CM | POA: Diagnosis not present

## 2011-06-15 DIAGNOSIS — I319 Disease of pericardium, unspecified: Secondary | ICD-10-CM

## 2011-06-15 DIAGNOSIS — I517 Cardiomegaly: Secondary | ICD-10-CM | POA: Diagnosis not present

## 2011-06-15 DIAGNOSIS — R5381 Other malaise: Secondary | ICD-10-CM | POA: Diagnosis not present

## 2011-06-15 DIAGNOSIS — S72009A Fracture of unspecified part of neck of unspecified femur, initial encounter for closed fracture: Secondary | ICD-10-CM | POA: Diagnosis not present

## 2011-06-15 DIAGNOSIS — N179 Acute kidney failure, unspecified: Secondary | ICD-10-CM | POA: Diagnosis not present

## 2011-06-15 DIAGNOSIS — G319 Degenerative disease of nervous system, unspecified: Secondary | ICD-10-CM | POA: Diagnosis not present

## 2011-06-15 LAB — BASIC METABOLIC PANEL
BUN: 87 mg/dL — ABNORMAL HIGH (ref 6–23)
Chloride: 99 mEq/L (ref 96–112)
GFR calc Af Amer: 33 mL/min — ABNORMAL LOW (ref >90)
Glucose, Bld: 145 mg/dL — ABNORMAL HIGH (ref 70–99)
Potassium: 4 mEq/L (ref 3.5–5.1)

## 2011-06-15 LAB — CARDIAC PANEL(CRET KIN+CKTOT+MB+TROPI)
CK, MB: 2.5 ng/mL (ref 0.3–4.0)
CK, MB: 2.3 ng/mL (ref 0.3–4.0)
Relative Index: 0.8 (ref 0.0–2.5)
Total CK: 264 U/L — ABNORMAL HIGH (ref 7–177)
Troponin I: <0.3 ng/mL (ref <0.30)
Troponin I: <0.3 ng/mL (ref <0.30)

## 2011-06-15 LAB — LACTIC ACID, PLASMA: Lactic Acid, Venous: 1.6 mmol/L (ref 0.5–2.2)

## 2011-06-15 LAB — URINE MICROSCOPIC-ADD ON

## 2011-06-15 LAB — FOLATE: Folate: 4.7 ng/mL

## 2011-06-15 LAB — CBC
HCT: 20.7 % — ABNORMAL LOW (ref 36.0–46.0)
Hemoglobin: 7.2 g/dL — ABNORMAL LOW (ref 12.0–15.0)
MCHC: 34.8 g/dL (ref 30.0–36.0)
RBC: 2.67 MIL/uL — ABNORMAL LOW (ref 3.87–5.11)

## 2011-06-15 LAB — IRON AND TIBC
Iron: 41 ug/dL — ABNORMAL LOW (ref 42–135)
Saturation Ratios: 19 % — ABNORMAL LOW (ref 20–55)
TIBC: 219 ug/dL — ABNORMAL LOW (ref 250–470)

## 2011-06-15 LAB — URINALYSIS, ROUTINE W REFLEX MICROSCOPIC
Glucose, UA: NEGATIVE mg/dL
Specific Gravity, Urine: 1.019 (ref 1.005–1.030)
pH: 6 (ref 5.0–8.0)

## 2011-06-15 LAB — OSMOLALITY, URINE: Osmolality, Ur: 527 mOsm/kg (ref 390–1090)

## 2011-06-15 LAB — VITAMIN B12: Vitamin B-12: 190 pg/mL — ABNORMAL LOW (ref 211–911)

## 2011-06-15 LAB — PROCALCITONIN: Procalcitonin: 0.99 ng/mL

## 2011-06-15 LAB — RETICULOCYTES: Retic Ct Pct: 3.9 % — ABNORMAL HIGH (ref 0.4–3.1)

## 2011-06-15 LAB — TSH: TSH: 2.356 u[IU]/mL (ref 0.350–4.500)

## 2011-06-15 MED ORDER — POLYETHYLENE GLYCOL 3350 17 G PO PACK
17.0000 g | PACK | Freq: Two times a day (BID) | ORAL | Status: DC
  Administered 2011-06-15 – 2011-06-16 (×2): 17 g via ORAL
  Filled 2011-06-15 (×17): qty 1

## 2011-06-15 MED ORDER — CYANOCOBALAMIN 1000 MCG/ML IJ SOLN
1000.0000 ug | Freq: Every day | INTRAMUSCULAR | Status: AC
  Administered 2011-06-15 – 2011-06-21 (×7): 1000 ug via SUBCUTANEOUS
  Filled 2011-06-15 (×11): qty 1

## 2011-06-15 MED ORDER — PIPERACILLIN-TAZOBACTAM 3.375 G IVPB
3.3750 g | Freq: Three times a day (TID) | INTRAVENOUS | Status: DC
  Administered 2011-06-15 (×3): 3.375 g via INTRAVENOUS
  Filled 2011-06-15 (×5): qty 50

## 2011-06-15 MED ORDER — BIOTENE DRY MOUTH MT LIQD
15.0000 mL | Freq: Two times a day (BID) | OROMUCOSAL | Status: DC
  Administered 2011-06-15 – 2011-06-20 (×13): 15 mL via OROMUCOSAL

## 2011-06-15 MED ORDER — VANCOMYCIN HCL 1000 MG IV SOLR
750.0000 mg | INTRAVENOUS | Status: DC
  Administered 2011-06-15 – 2011-06-16 (×2): 750 mg via INTRAVENOUS
  Filled 2011-06-15 (×3): qty 750

## 2011-06-15 MED ORDER — TRAMADOL HCL 50 MG PO TABS
50.0000 mg | ORAL_TABLET | Freq: Once | ORAL | Status: AC
  Administered 2011-06-15: 50 mg via ORAL
  Filled 2011-06-15: qty 1

## 2011-06-15 MED ORDER — SODIUM CHLORIDE 0.9 % IJ SOLN
10.0000 mL | INTRAMUSCULAR | Status: DC | PRN
  Administered 2011-06-20: 10 mL

## 2011-06-15 MED ORDER — HEPARIN SOD (PORCINE) IN D5W 100 UNIT/ML IV SOLN
1050.0000 [IU]/h | INTRAVENOUS | Status: DC
  Administered 2011-06-15: 1200 [IU]/h via INTRAVENOUS
  Administered 2011-06-16: 1050 [IU]/h via INTRAVENOUS
  Filled 2011-06-15 (×5): qty 250

## 2011-06-15 MED ORDER — SENNA 8.6 MG PO TABS
1.0000 | ORAL_TABLET | Freq: Two times a day (BID) | ORAL | Status: DC
  Administered 2011-06-15 – 2011-06-21 (×6): 8.6 mg via ORAL
  Filled 2011-06-15 (×18): qty 1

## 2011-06-15 MED ORDER — BISACODYL 10 MG RE SUPP: 10.0000 mg | Freq: Every day | RECTAL | Status: DC | PRN

## 2011-06-15 MED ORDER — TRAMADOL HCL 50 MG PO TABS
50.0000 mg | ORAL_TABLET | Freq: Four times a day (QID) | ORAL | Status: DC | PRN
  Administered 2011-06-15: 50 mg via ORAL
  Filled 2011-06-15: qty 1

## 2011-06-15 MED ORDER — LEVOFLOXACIN IN D5W 750 MG/150ML IV SOLN
750.0000 mg | INTRAVENOUS | Status: DC
  Administered 2011-06-15: 750 mg via INTRAVENOUS
  Filled 2011-06-15: qty 150

## 2011-06-15 MED ORDER — OXYCODONE HCL 5 MG PO TABS
5.0000 mg | ORAL_TABLET | ORAL | Status: DC | PRN
  Administered 2011-06-15 – 2011-06-23 (×32): 5 mg via ORAL
  Filled 2011-06-15 (×32): qty 1

## 2011-06-15 NOTE — Progress Notes (Signed)
ANTICOAGULATION CONSULT NOTE - Follow Up Consult  Pharmacy Consult for heparin  Indication: pulmonary embolus  Allergies  Allergen Reactions  . Morphine And Related Other (See Comments)    unknown  . Sulfur Other (See Comments)    unknown  . Vicodin (Hydrocodone-Acetaminophen) Other (See Comments)    unknown    Patient Measurements: Height: 5\' 5"  (165.1 cm) Weight: 184 lb 15.5 oz (83.9 kg) IBW/kg (Calculated) : 57  Heparin Dosing Weight: 68kg  Vital Signs: Temp: 97.1 F (36.2 C) (01/30 2040) Temp src: Oral (01/30 2040) BP: 119/51 mmHg (01/30 2029) Pulse Rate: 73  (01/30 2029)  Labs:  Basename 06/15/11 2054 06/15/11 1516 06/15/11 0655 06/15/11 0011 06/14/11 2228  HGB -- -- 7.2* -- 8.2*  HCT -- -- 20.7* -- 23.7*  PLT -- -- 264 -- 273  APTT -- -- -- -- 32  LABPROT -- -- -- -- 17.1*  INR -- -- -- -- 1.37  HEPARINUNFRC 0.84* -- -- -- --  CREATININE -- -- 1.67* -- 1.74*  CKTOTAL -- 237* 264* 312* --  CKMB -- 2.3 2.5 2.6 --  TROPONINI -- <0.30 <0.30 <0.30 --   Estimated Creatinine Clearance: 30.2 ml/min (by C-G formula based on Cr of 1.67).   Medications:  Heparin infusion at 1200 units/hr  Assessment: 78 yo who has a h/o hemorrhagic CVA/DVT/recent femur fx w/ hemiarthroplasty was tx from [facility] for further care escalation. Patient is currently on heparin infusion for PE. Heparin level supratherapeutic.   Goal of Therapy:  Heparin level 0.3-0.7 units/ml   Plan:  Decrease heparin rate to 1050 units/hr F/u am heparin level   Riki Rusk 06/15/2011,10:18 PM

## 2011-06-15 NOTE — Progress Notes (Addendum)
ANTICOAGULATION CONSULT NOTE - Initial Consult  Pharmacy Consult for heparin Indication: pulmonary embolus  Allergies  Allergen Reactions  . Morphine And Related Other (See Comments)    unknown  . Sulfur Other (See Comments)    unknown  . Vicodin (Hydrocodone-Acetaminophen) Other (See Comments)    unknown    Patient Measurements: Height: 5\' 5"  (165.1 cm) Weight: 184 lb 15.5 oz (83.9 kg) IBW/kg (Calculated) : 57   Vital Signs: Temp: 97.5 F (36.4 C) (01/30 1158) Temp src: Oral (01/30 1158) BP: 130/58 mmHg (01/30 1158) Pulse Rate: 77  (01/30 1158)  Labs:  Basename 06/15/11 0655 06/15/11 0011 06/14/11 2228  HGB 7.2* -- 8.2*  HCT 20.7* -- 23.7*  PLT 264 -- 273  APTT -- -- 32  LABPROT -- -- 17.1*  INR -- -- 1.37  HEPARINUNFRC -- -- --  CREATININE 1.67* -- 1.74*  CKTOTAL 264* 312* --  CKMB 2.5 2.6 --  TROPONINI <0.30 <0.30 --   Estimated Creatinine Clearance: 30.2 ml/min (by C-G formula based on Cr of 1.67).  Medical History: Past Medical History  Diagnosis Date  . Asthma   . Headache   . PE (pulmonary embolism)   . DVT (deep venous thrombosis)   . Depression   . CHF (congestive heart failure)   . On home oxygen therapy   . Stroke     Assessment: 78 yo who has a h/o hemorrhagic CVA/DVT/recent femur fx w/ hemiarthroplasty was tx from [facility] for further care escalation. She developed a PE during her stay at [facility] and was started on IV heparin. It was then stopped before the tx here to [facility]. Questions were raised whether this is a PE vs fat embolism. Anticoag was not started at [facility] until her head CT can be evaluated. The CT came back neg for bleed so heparin has been ordered to be resumed. Since a dose of lovenox was given this AM at 0900, will not give a bolus  Goal of Therapy:  Heparin level 0.3-0.7 units/ml   Plan:  1. Heparin drip at 1200 units/hr 2. Check 8 hr heparin level  Ulyses Southward [place] 06/15/2011,12:27 PM

## 2011-06-15 NOTE — Progress Notes (Signed)
No allergic rx noted to oxycodone and pain relief achieved.  IV team here now and PICC line being placed.

## 2011-06-15 NOTE — Progress Notes (Signed)
Pt had Echocardiogram done at bedside.  Tolerated without problems.  Now taken to ultrasound for Renal US then to go for CXR.  Pt family updated.  Order for pt to go off monitor without RN.  Pt taken in bed.

## 2011-06-15 NOTE — Progress Notes (Signed)
  Echocardiogram 2D Echocardiogram has been performed.  Cathie Beams Deneen 06/15/2011, 10:41 AM

## 2011-06-15 NOTE — Progress Notes (Signed)
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 CHF, asthma, tripped and fell in a parking lot, and was initially seen at [facility] on 1/24. She was found to have right femur neck fracture . She was medically cleared and underwent right hemiarthroplasty on 1/25. On 1/26, pt developed respiratory distress and went into acute hypoxic respiratory failure, she underwent ct angiogram of the chest which showed bilateral pulmonary emboli, bilateral infiltrates and bilateral pleural effusions. She was transferred to ICU, and was started on anticoagulation. Meanwhile she developed MRSA UTI, acute renal failure, and she went into SVT, with troponin leak. Her respiratory status did not improve and she was maintained on venti mask till 1/29.  Her anticoagulation was stopped, thinking she might have fat embolism in view of her femur fracture and hemiarthroplasty.  At the present time the patient is alert but somewhat confused.  She c/o pain "all over," but particularly worse in the R hip.  She denies chest heaviness or sob.  I have updated her extended family on her clinical condition with her permission.  I have completed an extensive review of her records from [facility].    Objective: Weight change:   Intake/Output Summary (Last 24 hours) at 06/15/11 1359 Last data filed at 06/15/11 1302  Gross per 24 hour  Intake   1680 ml  Output    550 ml  Net   1130 ml   Blood pressure 130/58, pulse 77, temperature 97.5 F (36.4 C), temperature source Oral, resp. rate 32, height 5\' 5"  (1.651 m), weight 83.9 kg (184 lb 15.5 oz), SpO2 96.00%.  Physical Exam: General: No acute respiratory distress Lungs: Clear to auscultation bilaterally with exception to bibasilar crackles - no wheeze Cardiovascular: Regular rate and rhythm without murmur gallop or rub  normal - distant bs Abdomen: Nontender, nondistended, soft, bowel sounds positive, no rebound, no ascites, no appreciable mass Extremities: No significant cyanosis or clubbing, R lateral hip wound dressed and dry, 1+ B LE edema  Neuro:  Alert but confused, moves all 4 ext, CNII-XII intact B  Lab Results:  [facility] 06/15/11 0655 06/14/11 2228  NA 135 131*  K 4.0 3.9  CL 99 95*  CO2 24 24  GLUCOSE 145* 143*  BUN 87* 75*  CREATININE 1.67* 1.74*  CALCIUM 8.7 8.7  MG -- 2.7*  PHOS -- 4.1    Basename 06/14/11 2228  AST 89*  ALT 62*  ALKPHOS 39  BILITOT 0.5  PROT 5.5*  ALBUMIN 2.1*    Basename 06/15/11 0655 06/14/11 2228  WBC 12.9* 12.1*  NEUTROABS -- --  HGB 7.2* 8.2*  HCT 20.7* 23.7*  MCV 77.5* 79.3  PLT 264 273    Basename 06/15/11 0655 06/15/11 0011  CKTOTAL 264* 312*  CKMB 2.5 2.6  CKMBINDEX -- --  TROPONINI <0.30 <0.30   Micro Results: Recent Results (from the past 240 hour(s))  MRSA PCR SCREENING     Status: Abnormal   Collection Time   06/14/11  8:20 PM      Component Value Range Status Comment   MRSA by PCR POSITIVE (*) NEGATIVE  Final     Studies/Results: All recent x-ray/radiology reports have been reviewed in detail.   Medications: I have reviewed the patient's complete medication list.  Assessment/Plan:  Pulmonary embolism Will resume heparin, given CT of head which reveals no evidence of ICH -  at this point I feel we must tx as if this is clot and not fat - will watch Hgb closely on anticoag - the only potential tx for fat emboli is steroids (not well proven) and I do not feel that steroids would be wise given her multiple drug resistant infections at present   R femoral neck fx s/p fall S/p surgical correction at [facility] - partial weight bearing per Orthopedist at [facility] - consult local Ortho MD if any complications are encountered during rehab process  Altered mental status Likely multifactorial, with contributions from sedating  meds, TME related to UTI, severe B12 deficiency, and sundowning - tx each individual issue and follow for improvement in sx  Acute hypoxic resp failure  resp status appears to be stabilizing at present - f/u CXR today suggests no evidence of a focal infiltrate - I do not feel that abx for pna are indicated   ?Pneumonia - Healthcare Acquired (not at [facility] on admit) As discussed above, CXR and exam are not consistent with an active pulmonary infection - I will d/c antibiotics directed at pulmonary organisms and follow clinically   Acute renal failure crt has improved slightly since admit - no acute findings on renal US - crt peaked at ~1.97   MRSA and enterococcus UTI Will cont vanc alone for now and recheck UA/cx and sensitivities  Acute blood loss anemia (surgery) + probable chronic anemia  May require further transfusion - will have to follow Hgb closely on heparin gtt  SVT/elevated troponin Stable at the present time - assure lytes are balanced - watch on tele   Severe B12 deficiency Begin daily B12 SQ for 7 day load   Lonia Blood, MD [facility] Office  [phone] Pager [phone]  On-Call/Text Page:      [email or web address]      password [facility]

## 2011-06-15 NOTE — Progress Notes (Signed)
Pt c/o increasing pain in R hip after turning and bathing.  Given Tylenol with minimal change in pain level.  Dr. Sharon Seller notified and Ultram ordered and given.  Again, minimal relief noted and pain increased to an 8 after turning.  Discussed with Dr. Sharon Seller and family.  Order for oxycodone received, Dr. Sharon Seller aware of Morphine allergy and discussed with family.  Pt was taking vicodin in past with no allergic rx.  Oxycodone given and will monitor for allergy and decreased pain.

## 2011-06-16 DIAGNOSIS — I10 Essential (primary) hypertension: Secondary | ICD-10-CM | POA: Diagnosis not present

## 2011-06-16 DIAGNOSIS — R5381 Other malaise: Secondary | ICD-10-CM | POA: Diagnosis not present

## 2011-06-16 DIAGNOSIS — R4182 Altered mental status, unspecified: Secondary | ICD-10-CM | POA: Diagnosis not present

## 2011-06-16 DIAGNOSIS — D62 Acute posthemorrhagic anemia: Secondary | ICD-10-CM | POA: Diagnosis not present

## 2011-06-16 LAB — URINE CULTURE
Colony Count: NO GROWTH
Culture  Setup Time: 201301301616
Culture: NO GROWTH

## 2011-06-16 LAB — CBC
Hemoglobin: 6.1 g/dL — CL (ref 12.0–15.0)
MCH: 26.5 pg (ref 26.0–34.0)
MCHC: 33.2 g/dL (ref 30.0–36.0)
MCHC: 34.6 g/dL (ref 30.0–36.0)
Platelets: 268 10*3/uL (ref 150–400)
RDW: 14.5 % (ref 11.5–15.5)
RDW: 14.2 % (ref 11.5–15.5)
WBC: 14.6 10*3/uL — ABNORMAL HIGH (ref 4.0–10.5)

## 2011-06-16 LAB — COMPREHENSIVE METABOLIC PANEL
ALT: 55 U/L — ABNORMAL HIGH (ref 0–35)
AST: 44 U/L — ABNORMAL HIGH (ref 0–37)
Albumin: 1.9 g/dL — ABNORMAL LOW (ref 3.5–5.2)
Alkaline Phosphatase: 30 U/L — ABNORMAL LOW (ref 39–117)
Calcium: 8.1 mg/dL — ABNORMAL LOW (ref 8.4–10.5)
Potassium: 3.8 mEq/L (ref 3.5–5.1)
Sodium: 135 mEq/L (ref 135–145)
Total Protein: 4.6 g/dL — ABNORMAL LOW (ref 6.0–8.3)

## 2011-06-16 LAB — PREPARE RBC (CROSSMATCH)

## 2011-06-16 LAB — HEPARIN LEVEL (UNFRACTIONATED): Heparin Unfractionated: 0.67 IU/mL (ref 0.30–0.70)

## 2011-06-16 NOTE — Progress Notes (Signed)
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 CHF, asthma, tripped and fell in a parking lot, and was initially seen at [facility] on 1/24. She was found to have right femur neck fracture . She was medically cleared and underwent right hemiarthroplasty on 1/25. On 1/26, pt developed respiratory distress and went into acute hypoxic respiratory failure, she underwent ct angiogram of the chest which showed bilateral pulmonary emboli, bilateral infiltrates and bilateral pleural effusions. She was transferred to ICU, and was started on anticoagulation. Meanwhile she developed MRSA UTI, acute renal failure, and she went into SVT, with troponin leak. Her respiratory status did not improve and she was maintained on venti mask till 1/29.  Her anticoagulation was stopped, thinking she might have fat embolism in view of her femur fracture and hemiarthroplasty.  She appears quite weak today. Pain in hip is controlled with medication. She has no complaints. She understands I will be transfusion blood today. I have discussed her current situation with the 2 daughters who are in the room.   Objective: Weight change: -1.3 kg (-2 lb 13.9 oz)  Intake/Output Summary (Last 24 hours) at 06/16/11 1534 Last data filed at 06/16/11 1500  Gross per 24 hour  Intake 3482.38 ml  Output   1350 ml  Net 2132.38 ml   Blood pressure 137/46, pulse 64, temperature 97.8 F (36.6 C), temperature source Oral, resp. rate 21, height 5\' 5"  (1.651 m), weight 82.6 kg (182 lb 1.6 oz), SpO2 99.00%.  Physical Exam: General: No acute respiratory distress Lungs: Clear to auscultation bilaterally with exception to bibasilar crackles - no wheeze Cardiovascular: Regular rate and rhythm without murmur gallop or rub normal - distant bs Abdomen: Nontender, nondistended, soft, bowel sounds  positive, no rebound, no ascites, no appreciable mass Extremities: No significant cyanosis or clubbing, R lateral hip wound dressed and dry, left thigh larger than right and quite tight but no distinct hematoma noted, no edema in lower legs/ankles. Neuro:  Alert, sleepy, moves all 4 ext, CNII-XII intact B  Lab Results:  Basename 06/16/11 0500 06/15/11 0655 06/14/11 2228  NA 135 135 131*  K 3.8 4.0 3.9  CL 102 99 95*  CO2 26 24 24   GLUCOSE 117* 145* 143*  BUN 68* 87* 75*  CREATININE 1.12* 1.67* 1.74*  CALCIUM 8.1* 8.7 8.7  MG -- -- 2.7*  PHOS -- -- 4.1    Basename 06/16/11 0500 06/14/11 2228  AST 44* 89*  ALT 55* 62*  ALKPHOS 30* 39  BILITOT 0.4 0.5  PROT 4.6* 5.5*  ALBUMIN 1.9* 2.1*    Basename 06/16/11 0500 06/15/11 0655 06/14/11 2228  WBC 12.0* 12.9* 12.1*  NEUTROABS -- -- --  HGB 6.1* 7.2* 8.2*  HCT 18.4* 20.7* 23.7*  MCV 80.0 77.5* 79.3  PLT 268 264 273    Basename 06/15/11 1516 06/15/11 0655 06/15/11 0011  CKTOTAL 237* 264* 312*  CKMB 2.3 2.5 2.6  CKMBINDEX -- -- --  TROPONINI <0.30 <0.30 <0.30   Micro Results: Recent Results (from the past 240 hour(s))  MRSA PCR SCREENING     Status: Abnormal   Collection Time   06/14/11  8:20 PM      Component Value Range Status Comment   MRSA by PCR POSITIVE (*) NEGATIVE  Final   CULTURE, BLOOD (ROUTINE X 2)     Status: Normal (Preliminary result)   Collection  Time   06/15/11 12:00 AM      Component Value Range Status Comment   Specimen Description BLOOD RIGHT HAND   Final    Special Requests BOTTLES DRAWN AEROBIC AND ANAEROBIC 10CC   Final    Culture  Setup Time [id number]   Final    Culture     Final    Value:        BLOOD CULTURE RECEIVED NO GROWTH TO DATE CULTURE WILL BE HELD FOR 5 DAYS BEFORE ISSUING A FINAL NEGATIVE REPORT   Report Status PENDING   Incomplete   CULTURE, BLOOD (ROUTINE X 2)     Status: Normal (Preliminary result)   Collection Time   06/15/11 12:11 AM      Component Value Range Status Comment     Specimen Description BLOOD RIGHT ARM   Final    Special Requests BOTTLES DRAWN AEROBIC AND ANAEROBIC 10CC   Final    Culture  Setup Time [id number]   Final    Culture     Final    Value:        BLOOD CULTURE RECEIVED NO GROWTH TO DATE CULTURE WILL BE HELD FOR 5 DAYS BEFORE ISSUING A FINAL NEGATIVE REPORT   Report Status PENDING   Incomplete   URINE CULTURE     Status: Normal   Collection Time   06/15/11  3:51 PM      Component Value Range Status Comment   Specimen Description URINE, CATHETERIZED   Final    Special Requests NONE   Final    Culture  Setup Time [id number]   Final    Colony Count NO GROWTH   Final    Culture NO GROWTH   Final    Report Status 06/16/2011 FINAL   Final     Studies/Results: All recent x-ray/radiology reports have been reviewed in detail.   Medications: I have reviewed the patient's complete medication list.  Assessment/Plan:  Pulmonary embolism Continue heparin - will watch Hgb closely on anticoag - On 3L of O2 currently- pulm status is stable.  Mod pulm HTN without RV failure noted on ECHO.  Anemia- due to acute blood loss into left thigh? Transfuse 2 more units now and follow hemoglobin carefully.  The heparin may be resulting in ongoing oozing into the thigh. Will request that RN start measuring her thigh circumference.  R femoral neck fx s/p fall S/p surgical correction at [facility] - partial weight bearing per Orthopedist at [facility] - will consult local Ortho MD if any complications are encountered during rehab process  Altered mental status Likely multifactorial, with contributions from sedating meds, TME related to UTI, severe B12 deficiency, and sundowning - tx each individual issue and follow for improvement in sx  Acute hypoxic resp failure  resp status appears to be stabilizing at present - f/u CXR today suggests no evidence of a focal infiltrate - I do not feel that abx for pna are indicated   ?Pneumonia - Healthcare  Acquired (not at [facility] on admit) As discussed above, CXR and exam are not consistent with an active pulmonary infection -antibiotics for PNA discontinued.  Acute renal failure crt continues to improve - no acute findings on renal US - crt peaked at ~1.97   MRSA and enterococcus UTI Will cont vanc alone for now and recheck UA/cx and sensitivities  Acute blood loss anemia (surgery) + probable chronic anemia  May require further transfusion - will have to follow Hgb closely on heparin gtt  SVT/elevated troponin Stable at the present time - assure lytes are balanced - watch on tele   Severe B12 deficiency - daily B12 SQ for 7 day load   H/o hemorrhagic CVA with right sided weakness and speech abnormalities.   Full Code  Calvert Cantor, MD [facility] Office  [phone] Pager [phone]  On-Call/Text Page:      [email or web address]      password [facility]

## 2011-06-16 NOTE — Evaluation (Signed)
Physical Therapy Evaluation Patient Details Name: Michele Summers MRN: [id number] DOB: 17-Oct-1931 Today's Date: 06/16/2011  Problem List:  Patient Active Problem List  Diagnoses  . Altered mental status  . Pulmonary embolism  . Respiratory failure with hypoxia  . Pneumonia  . Acute renal failure  . Femur fracture, right  . Asthma  . UTI (lower urinary tract infection)    Past Medical History:  Past Medical History  Diagnosis Date  . Asthma   . Headache   . PE (pulmonary embolism)   . DVT (deep venous thrombosis)   . Depression   . CHF (congestive heart failure)   . On home oxygen therapy   . Stroke    Past Surgical History:  Past Surgical History  Procedure Date  . Abdominal hysterectomy   . Craniotomy   . 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 CHF, asthma, tripped and fell in a parking lot, and was initially seen at [facility] on 1/24. She was found to have right femur neck fracture . She was medically cleared and underwent right hemiarthroplasty on 1/25. On 1/26, pt developed respiratory distress and went into acute hypoxic respiratory failure, she underwent ct angiogram of the chest which showed bilateral pulmonary emboli, bilateral infiltrates and bilateral pleural effusions. She was transferred to ICU, and was started on anticoagulation. Meanwhile she developed MRSA UTI, acute renal failure, and she went into SVT, with troponin leak. Her respiratory status did not improve and she was maintained on venti mask till 1/29.  Her anticoagulation was stopped, thinking she might have fat embolism in view of her femur fracture and hemiarthroplasty.   PT/OT co-eval completed today. Pt demonstrating decreased ROM, strength, and general mobility as well as the following problem  list. Pt will benefit from physical therapy in the acute setting to maximize mobility and restore prior level of function in an effort to decrease her BOC and length of stay at next venue. Rec SNF for follow up therapies. Educated family members and nursing on the need for daily AAROM (AP, QS, HS and ABD/ADD) to improve strength and ROM. PT Recommendation/Assessment: Patient will need skilled PT in the acute care venue PT Problem List: Decreased strength;Decreased range of motion;Decreased activity tolerance;Decreased balance;Decreased cognition;Decreased coordination;Decreased mobility;Decreased knowledge of use of DME;Decreased safety awareness;Decreased knowledge of precautions;Cardiopulmonary status limiting activity;Pain;Obesity PT Therapy Diagnosis : Difficulty walking;Abnormality of gait;Generalized weakness;Acute pain PT Plan PT Frequency: Min 3X/week PT Treatment/Interventions: DME instruction;Gait training;Functional mobility training;Stair training;Therapeutic exercise;Therapeutic activities;Balance training;Neuromuscular re-education;Patient/family education PT Recommendation Follow Up Recommendations: Skilled nursing facility and daily AAROM to right hip  Equipment Recommended: Defer to next venue PT Goals  Acute Rehab PT Goals PT Goal Formulation: With patient Time For Goal Achievement: 2 weeks Pt will Roll Supine to Right Side: with mod assist PT Goal: Rolling Supine to Right Side - Progress: Goal set today Pt will Roll Supine to Left Side: with mod assist PT Goal: Rolling Supine to Left Side - Progress: Goal set today Pt will go Supine/Side to Sit: with mod assist PT Goal: Supine/Side to Sit - Progress: Goal set today Pt will Sit at Edge of Bed: with mod assist;3-5 min;with unilateral upper extremity support PT Goal: Sit at Edge Of Bed - Progress: Goal set today Pt will go Sit to Supine/Side: with mod assist PT Goal: Sit to Supine/Side -  Progress: Goal set today Pt will go Sit  to Stand: with upper extremity assist;with mod assist PT Goal: Sit to Stand - Progress: Goal set today Pt will go Stand to Sit: with mod assist PT Goal: Stand to Sit - Progress: Goal set today Pt will Transfer Bed to Chair/Chair to Bed: with mod assist PT Transfer Goal: Bed to Chair/Chair to Bed - Progress: Goal set today Pt will Stand: with mod assist;1 - 2 min;with bilateral upper extremity support PT Goal: Stand - Progress: Goal set today Pt will Ambulate: 1 - 15 feet;with modified independence;with least restrictive assistive device PT Goal: Ambulate - Progress: Goal set today Pt will Perform Home Exercise Program: with supervision, verbal cues required/provided PT Goal: Perform Home Exercise Program - Progress: Goal set today  PT Evaluation Precautions/Restrictions  Precautions Precautions: Fall Precaution Comments: recent Rt hip hemi Restrictions Weight Bearing Restrictions: Yes RLE Weight Bearing: Partial weight bearing Prior Functioning  Home Living Home Adaptive Equipment: None Prior Function Level of Independence: Independent with basic ADLs;Independent with homemaking with ambulation;Independent with gait Cognition Cognition Arousal/Alertness: Awake/alert Overall Cognitive Status: Appears within functional limits for tasks assessed Orientation Level: Oriented X4 Sensation/Coordination Sensation Light Touch: Appears Intact Stereognosis: Not tested Hot/Cold: Not tested Proprioception: Not tested Coordination Gross Motor Movements are Fluid and Coordinated: No Fine Motor Movements are Fluid and Coordinated: No Coordination and Movement Description: Impaired secondary to weakness Extremity Assessment RUE Assessment RUE Assessment: Exceptions to [facility]) (defer to OT) RUE Strength RUE Overall Strength Comments: shoulder 2+/5; elbow and distally 3+/5 LUE Assessment LUE Assessment:  (defer to OT) LUE Strength LUE Overall Strength Comments: shoulder 2+/5; elbow and  distally 3+/5 RLE Assessment RLE Assessment:  (grossly 2-/5) RLE Strength RLE Overall Strength Comments: Pt with very strong hold into adducted hip position but able to abduct with much facilitation (grossly 10-15 degrees of abduction); performed heels slides with maybe 30 degrees of knee flexion and only with active assist; pts ankle inverted and planter flexed corrects to grossly 5 degrees from neutral and pt demonstrating grossly 2-/t strength LLE Assessment LLE Assessment:  (grossly 3/5) Mobility (including Balance) Bed Mobility Bed Mobility: Yes Supine to Sit: 1: +2 Total assist (0%) Supine to Sit Details (indicate cue type and reason): +3totalpt0%; because of significant pain and fatigue pt not able to perform this today Sit to Supine: 1: +2 Total assist;HOB flat (0%) Scooting to HOB: 1: +2 Total assist (0%) Transfers Transfers: Yes Sit to Stand: 1: +2 Total assist;From bed;From elevated surface (10%) Sit to Stand Details (indicate cue type and reason): attempted sit->stand x1 but pt not extending through legs/trunk at all; was able to translate anteriorly but not able to follow through to stand so sat immediately Ambulation/Gait Ambulation/Gait: No Stairs: No  Balance Balance Assessed: Yes Static Sitting Balance Static Sitting - Balance Support: Bilateral upper extremity supported Static Sitting - Level of Assistance: 4: Min assist Static Sitting - Comment/# of Minutes: initally pt maxA to sit EOB with both LE/UE supported; with cueing for anterior translation pt able to bring self forward and place her hands on my shoulders; very weak upper extremities (see OT note) ; maybe able to sit unsupported with SBA 5 seconds at a time; tends toward posterior lean Exercise  Total Joint Exercises Ankle Circles/Pumps: AAROM;Both;10 reps;Supine Heel Slides: AAROM;Right;10 reps;Supine Hip ABduction/ADduction: 5 reps;AAROM End of Session PT - End of Session Equipment Utilized During  Treatment: Gait belt Activity Tolerance: Patient limited by pain Patient left: in bed Nurse Communication:  Mobility status for transfers;Need for lift equipment;Weight bearing status (need for daily ROM to bilateral LE) General Behavior During Session: [employer] for tasks performed Cognition: [employer] for tasks performed  [facility] HELEN 06/16/2011, 4:19 PM

## 2011-06-16 NOTE — Progress Notes (Signed)
PT/OT Note: spoke with RN. Pt with Hgb 6.1 and to get blood this am. Will f/u this afternoon as time allows. Ivonne Andrew PT, DPT [phone]

## 2011-06-16 NOTE — Progress Notes (Signed)
Right thigh circumference 62Cm.

## 2011-06-16 NOTE — Plan of Care (Signed)
Problem: Phase II Progression Outcomes Goal: Bed to chair Outcome: Progressing Pt sat on the edge of the bed for the first time today. Because of pain and fatigue we did not transfer her to the chair. Did attempt standing but pt not able today. Will attempt transfer next session. Nursing aware of need for maxi-move to transfer pt at this time.

## 2011-06-16 NOTE — Progress Notes (Signed)
CRITICAL VALUE ALERT  Critical value received:  HGB 6.1   Date of notification:  06/16/11  Time of notification:  0732hrs  Critical value read back:yes  Nurse who received alert:  L. Vassie Loll RN   MD notified (1st page):  Dr. Sharon Seller   Time of first page:  0735hrs  MD notified (2nd page): DR. Butler Denmark  Time of second [id number]:[id number]  Responding MD:  Dr. Butler Denmark  Time MD responded:  [phone]  Order for PRBCs to transfuse for 2 units received.

## 2011-06-16 NOTE — Progress Notes (Signed)
Glendale Chard, OTR/L Pager: [phone] 06/16/2011

## 2011-06-16 NOTE — Evaluation (Signed)
Occupational Therapy Evaluation Patient Details Name: Michele Summers MRN: [id number] DOB: March 05, 1932 Today's Date: 06/16/2011  Problem List:  Patient Active Problem List  Diagnoses  . Altered mental status  . Pulmonary embolism  . Respiratory failure with hypoxia  . Pneumonia  . Acute renal failure  . Femur fracture, right  . Asthma  . UTI (lower urinary tract infection)    Past Medical History:  Past Medical History  Diagnosis Date  . Asthma   . Headache   . PE (pulmonary embolism)   . DVT (deep venous thrombosis)   . Depression   . CHF (congestive heart failure)   . On home oxygen therapy   . Stroke    Past Surgical History:  Past Surgical History  Procedure Date  . Abdominal hysterectomy   . Craniotomy   . 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 CHF, asthma, tripped and fell in a parking lot, and was initially seen at [facility] on 1/24. She was found to have right femur neck fracture . She was medically cleared and underwent right hemiarthroplasty on 1/25. On 1/26, pt developed respiratory distress and went into acute hypoxic respiratory failure, she underwent ct angiogram of the chest which showed bilateral pulmonary emboli, bilateral infiltrates and bilateral pleural effusions. She was transferred to ICU, and was started on anticoagulation. Meanwhile she developed MRSA UTI, acute renal failure, and she went into SVT, with troponin leak. Her respiratory status did not improve and she was maintained on venti mask till 1/29. Pt presents with below problem list and will benefit from skilled OT in the acute setting to increase I with ADL and ADL mobility to Mod A-setup level upon d/c to next venue of care. OT Recommendation/Assessment: Patient will need skilled OT in  the acute care venue OT Problem List: Decreased strength;Decreased range of motion;Decreased activity tolerance;Impaired balance (sitting and/or standing);Decreased coordination;Decreased knowledge of use of DME or AE;Decreased knowledge of precautions;Pain OT Therapy Diagnosis : Generalized weakness;Cognitive deficits;Acute pain OT Plan OT Frequency: Min 1X/week OT Treatment/Interventions: Self-care/ADL training;Therapeutic exercise;DME and/or AE instruction;Therapeutic activities;Patient/family education;Balance training OT Recommendation Follow Up Recommendations: Skilled nursing facility (would like to keep CIR in mind pending patient progress) Equipment Recommended: Defer to next venue Individuals Consulted Consulted and Agree with Results and Recommendations: Patient OT Goals Acute Rehab OT Goals OT Goal Formulation: With patient Time For Goal Achievement: 2 weeks ADL Goals Pt Will Perform Grooming: with min assist;Standing at sink ADL Goal: Grooming - Progress: Goal set today Pt Will Perform Upper Body Dressing: with set-up;Sitting, bed;Unsupported ADL Goal: Upper Body Dressing - Progress: Goal set today Pt Will Perform Lower Body Dressing: with mod assist;Sit to stand from bed;Unsupported ADL Goal: Lower Body Dressing - Progress: Goal set today Pt Will Transfer to Toilet: with mod assist;Stand pivot transfer;with DME ADL Goal: Toilet Transfer - Progress: Goal set today Pt Will Perform Toileting - Clothing Manipulation: with min assist;Standing ADL Goal: Toileting - Clothing Manipulation - Progress: Goal set today Pt Will Perform Toileting - Hygiene: with min assist;Sitting on 3-in-1 or toilet ADL Goal: Toileting - Hygiene - Progress: Goal set today  OT Evaluation Precautions/Restrictions  Precautions Precautions: Fall Precaution Comments: recent Rt hip hemi Restrictions Weight Bearing Restrictions: Yes RLE Weight Bearing: Partial weight bearing Prior Functioning Home  Living Home Adaptive Equipment: None Prior Function Level of  Independence: Independent with basic ADLs;Independent with homemaking with ambulation;Independent with gait ADL ADL Eating/Feeding: +1 Total assistance;Performed Eating/Feeding Details (indicate cue type and reason): family feeding patient upon patient entry Where Assessed - Eating/Feeding: Bed level ADL Comments: Pt with large BM upon attempt at bed mobility. Rolled patient Rt and left to clean (see bed mobility section for details). Attempted sit to stand, pt unable to extend legs -secondary to pain? Vision/Perception  Vision - History Patient Visual Report: No change from baseline Cognition Cognition Arousal/Alertness: Awake/alert Overall Cognitive Status: Appears within functional limits for tasks assessed Orientation Level: Oriented X4 Sensation/Coordination Sensation Light Touch: Appears Intact Stereognosis: Not tested Hot/Cold: Not tested Proprioception: Not tested Coordination Gross Motor Movements are Fluid and Coordinated: No Fine Motor Movements are Fluid and Coordinated: No Coordination and Movement Description: Impaired secondary to weakness Extremity Assessment RUE Assessment RUE Assessment: Exceptions to [facility] (defer to OT) RUE Strength RUE Overall Strength Comments: shoulder 2+/5; elbow and distally 3+/5 LUE Assessment LUE Assessment:  (defer to OT) LUE Strength LUE Overall Strength Comments: shoulder 2+/5; elbow and distally 3+/5 Mobility  Bed Mobility Bed Mobility: Yes Supine to Sit: 1: +2 Total assist (0%) Supine to Sit Details (indicate cue type and reason): +3totalpt0%; because of significant pain and fatigue pt not able to perform this today Sit to Supine: 1: +2 Total assist;HOB flat (0%) Scooting to HOB: 1: +2 Total assist (0%) Transfers Sit to Stand: 1: +2 Total assist;From bed;From elevated surface (10%) Sit to Stand Details (indicate cue type and reason): attempted sit->stand x1 but pt  not extending through legs/trunk at all; was able to translate anteriorly but not able to follow through to stand so sat immediately End of Session OT - End of Session Equipment Utilized During Treatment: Gait belt Activity Tolerance: Patient limited by pain Patient left: in bed;with call bell in reach Nurse Communication: Mobility status for transfers General Behavior During Session: [facility] for tasks performed Cognition: [facility] for tasks performed   Adrian Dinovo 06/16/2011, 4:29 PM

## 2011-06-16 NOTE — Progress Notes (Signed)
ANTICOAGULATION CONSULT NOTE - Follow Up Consult  Pharmacy Consult for heparin  Indication: pulmonary embolus  Allergies  Allergen Reactions  . Morphine And Related Other (See Comments)    unknown  . Sulfur Other (See Comments)    unknown  . Vicodin (Hydrocodone-Acetaminophen) Other (See Comments)    unknown    Patient Measurements: Height: 5\' 5"  (165.1 cm) Weight: 182 lb 1.6 oz (82.6 kg) IBW/kg (Calculated) : 57  Heparin Dosing Weight: 68kg  Vital Signs: Temp: 97.6 F (36.4 C) (01/31 0835) Temp src: Axillary (01/31 0835) BP: 130/50 mmHg (01/31 0800) Pulse Rate: 62  (01/31 0835)  Labs:  Basename 06/16/11 0500 06/15/11 2054 06/15/11 1516 06/15/11 0655 06/15/11 0011 06/14/11 2228  HGB 6.1* -- -- 7.2* -- --  HCT 18.4* -- -- 20.7* -- 23.7*  PLT 268 -- -- 264 -- 273  APTT -- -- -- -- -- 32  LABPROT -- -- -- -- -- 17.1*  INR -- -- -- -- -- 1.37  HEPARINUNFRC 0.67 0.84* -- -- -- --  CREATININE 1.12* -- -- 1.67* -- 1.74*  CKTOTAL -- -- 237* 264* 312* --  CKMB -- -- 2.3 2.5 2.6 --  TROPONINI -- -- <0.30 <0.30 <0.30 --   Estimated Creatinine Clearance: 44.6 ml/min (by C-G formula based on Cr of 1.12).   Medications:  Heparin infusion at 1200 units/hr  Assessment: 78 yo who has a h/o hemorrhagic CVA/DVT/recent femur fx w/ hemiarthroplasty was tx from [facility] for further care escalation. Patient is currently on heparin infusion for PE. Heparin level = 0.67 this AM.  Goal of Therapy:  Heparin level 0.3-0.7 units/ml   Plan:  Heparin rate to 1050 units/hr F/u am heparin level   Michele Summers 06/16/2011,8:47 AM

## 2011-06-17 ENCOUNTER — Other Ambulatory Visit: Payer: Self-pay

## 2011-06-17 ENCOUNTER — Ambulatory Visit: Payer: Self-pay | Admitting: Internal Medicine

## 2011-06-17 DIAGNOSIS — D62 Acute posthemorrhagic anemia: Secondary | ICD-10-CM | POA: Diagnosis not present

## 2011-06-17 DIAGNOSIS — E876 Hypokalemia: Secondary | ICD-10-CM | POA: Diagnosis not present

## 2011-06-17 DIAGNOSIS — R5381 Other malaise: Secondary | ICD-10-CM | POA: Diagnosis not present

## 2011-06-17 DIAGNOSIS — R4182 Altered mental status, unspecified: Secondary | ICD-10-CM | POA: Diagnosis not present

## 2011-06-17 DIAGNOSIS — I10 Essential (primary) hypertension: Secondary | ICD-10-CM | POA: Diagnosis not present

## 2011-06-17 LAB — BASIC METABOLIC PANEL
BUN: 39 mg/dL — ABNORMAL HIGH (ref 6–23)
CO2: 25 mEq/L (ref 19–32)
Calcium: 8.5 mg/dL (ref 8.4–10.5)
Chloride: 104 mEq/L (ref 96–112)
Creatinine, Ser: 0.81 mg/dL (ref 0.50–1.10)
Creatinine, Ser: 0.72 mg/dL (ref 0.50–1.10)
GFR calc Af Amer: 79 mL/min — ABNORMAL LOW (ref >90)
GFR calc Af Amer: >90 mL/min (ref >90)
GFR calc non Af Amer: 68 mL/min — ABNORMAL LOW (ref >90)
GFR calc non Af Amer: 81 mL/min — ABNORMAL LOW (ref >90)
Potassium: 3.2 mEq/L — ABNORMAL LOW (ref 3.5–5.1)
Sodium: 135 mEq/L (ref 135–145)

## 2011-06-17 LAB — OCCULT BLOOD X 1 CARD TO LAB, STOOL: Fecal Occult Bld: NEGATIVE

## 2011-06-17 LAB — CBC
HCT: 26.4 % — ABNORMAL LOW (ref 36.0–46.0)
MCHC: 33.7 g/dL (ref 30.0–36.0)
MCV: 81.2 fL (ref 78.0–100.0)
Platelets: 250 10*3/uL (ref 150–400)
RDW: 14.5 % (ref 11.5–15.5)
WBC: 13.8 10*3/uL — ABNORMAL HIGH (ref 4.0–10.5)

## 2011-06-17 LAB — TYPE AND SCREEN

## 2011-06-17 LAB — HEPARIN LEVEL (UNFRACTIONATED): Heparin Unfractionated: 0.44 IU/mL (ref 0.30–0.70)

## 2011-06-17 MED ORDER — POTASSIUM CHLORIDE CRYS ER 20 MEQ PO TBCR
40.0000 meq | EXTENDED_RELEASE_TABLET | Freq: Two times a day (BID) | ORAL | Status: DC
  Administered 2011-06-17 – 2011-06-19 (×6): 40 meq via ORAL
  Filled 2011-06-17 (×10): qty 2

## 2011-06-17 MED ORDER — POTASSIUM CHLORIDE 20 MEQ PO PACK
40.0000 meq | PACK | Freq: Two times a day (BID) | ORAL | Status: DC
  Filled 2011-06-17 (×2): qty 2

## 2011-06-17 MED ORDER — VANCOMYCIN HCL 1000 MG IV SOLR
750.0000 mg | Freq: Two times a day (BID) | INTRAVENOUS | Status: DC
  Administered 2011-06-17 – 2011-06-19 (×6): 750 mg via INTRAVENOUS
  Filled 2011-06-17 (×8): qty 750

## 2011-06-17 MED ORDER — HEPARIN SOD (PORCINE) IN D5W 100 UNIT/ML IV SOLN
1450.0000 [IU]/h | INTRAVENOUS | Status: AC
  Administered 2011-06-17: 1100 [IU]/h via INTRAVENOUS
  Administered 2011-06-18 (×2): 1250 [IU]/h via INTRAVENOUS
  Administered 2011-06-19: 1450 [IU]/h via INTRAVENOUS
  Filled 2011-06-17 (×5): qty 250

## 2011-06-17 NOTE — Progress Notes (Signed)
Physical Therapy Treatment Patient Details Name: Michele Summers MRN: [id number] DOB: May 07, 1932 Today's Date: 06/17/2011  PT Assessment/Plan  PT - Assessment/Plan Comments on Treatment Session: Did Ok today. Still very affected by pain and fatigue. Cannot tolerate long treatment sessions. Encouraged family to continue with RLE mobility/exercises over the weekend and nursing staff to use lift to get her up over the weekend and this afternoon.  PT Plan: Discharge plan remains appropriate;Frequency remains appropriate Follow Up Recommendations: Skilled nursing facility and daily AAROM to RLE Equipment Recommended: Defer to next venue PT Goals  Acute Rehab PT Goals PT Goal: Supine/Side to Sit - Progress: Progressing toward goal PT Goal: Sit at [facility] Of Bed - Progress: Progressing toward goal PT Goal: Sit to Supine/Side - Progress: Progressing toward goal PT Goal: Sit to Stand - Progress: Progressing toward goal PT Goal: Stand to Sit - Progress: Progressing toward goal PT Transfer Goal: Bed to Chair/Chair to Bed - Progress: Progressing toward goal PT Goal: Stand - Progress: Progressing toward goal PT Goal: Ambulate - Progress: Progressing toward goal PT Goal: Perform Home Exercise Program - Progress: Progressing toward goal  PT Treatment Precautions/Restrictions  Precautions Precautions: Fall Precaution Comments: Rt hip hemiarthroplasty Restrictions Weight Bearing Restrictions: Yes RLE Weight Bearing: Partial weight bearing RLE Partial Weight Bearing Percentage or Pounds: 50% Mobility (including Balance) Bed Mobility Supine to Sit: 1: +2 Total assist (10%) Supine to Sit Details (indicate cue type and reason): pt initiating moving lower extremities to edge of bed but needing max assist to follow through; +2totalpt10% to bring trunk upright Sitting - Scoot to Edge of Bed: 2: Max assist Sitting - Scoot to [place] of Bed Details (indicate cue type and reason): use of bad for reciprocal scooting;  cues for wieght shift Transfers Sit to Stand: 1: +2 Total assist (30%) Sit to Stand Details (indicate cue type and reason): +2totalpt30% with max facilitation bilaterally for hip/trunk extension and to encourage participation; pt very limited by pain and fatigue; stood x3 because pt became incontinent with bowel; needs a block around her knee for foot flat as her ankle remains plantar flexed and inverted (poor ankle control secondary to weakness); also needed block for right knee to prevent buckling Stand to Sit: 1: +2 Total assist (30%) Stand Pivot Transfers: 1: +2 Total assist (25%) Stand Pivot Transfer Details (indicate cue type and reason): MAX facilitation bilaterally for upright posture and hip/trunk extension and to initiate pivot; pt became incontinent again wtih bowel during transfer so pads all soiled once in the chair; nsg tech aware and will put her back to bed once anterior bathing is complete (15 minutes) Ambulation/Gait Ambulation/Gait: No  Posture/Postural Control Posture/Postural Control: Postural limitations Postural Limitations: poor trunk extension, flexes forward at hips with fatigue; can correct for maybe 5 second intervals with max cueing Static Sitting Balance Static Sitting - Balance Support: Bilateral upper extremity supported Static Sitting - Level of Assistance: 4: Min assist;3: Mod assist Static Sitting - Comment/# of Minutes: still falling posteriorly; max cueing for hand placement to support self Exercise  Total Joint Exercises Ankle Circles/Pumps: Both;10 reps;Supine;AAROM Quad Sets: AAROM;Supine;5 reps;Right Heel Slides: AAROM;Right;5 reps;Supine End of Session PT - End of Session Equipment Utilized During Treatment: Gait belt Activity Tolerance: Patient limited by fatigue;Patient limited by pain Patient left: in chair;with call bell in reach;with family/visitor present (nursing tech present for bathing) Nurse Communication: Mobility status for  transfers;Need for lift equipment;Weight bearing status General Behavior During Session: Lethargic (pain meds given 30 min prior to  treatment) Cognition: WFL for tasks performed  [facility] HELEN 06/17/2011, 1:04 PM

## 2011-06-17 NOTE — Plan of Care (Signed)
Problem: Phase II Progression Outcomes Goal: Bed to chair Outcome: Progressing Pt up to chair today with therapy.

## 2011-06-17 NOTE — Progress Notes (Signed)
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 CHF, asthma, tripped and fell in a parking lot, and was initially seen at [facility] on 1/24. She was found to have right femur neck fracture . She was medically cleared and underwent right hemiarthroplasty on 1/25. On 1/26, pt developed respiratory distress and went into acute hypoxic respiratory failure, she underwent ct angiogram of the chest which showed bilateral pulmonary emboli, bilateral infiltrates and bilateral pleural effusions. She was transferred to ICU, and was started on anticoagulation. Meanwhile she developed MRSA UTI, acute renal failure, and she went into SVT, with troponin leak. Her respiratory status did not improve and she was maintained on venti mask till 1/29.  Her anticoagulation was stopped, thinking she might have fat embolism in view of her femur fracture and hemiarthroplasty. We have resumed the Heparin.   She is currently in pain. Per daughter she continues to eat well and has began to work with PT.   Objective: Weight change: 5.7 kg (12 lb 9.1 oz)  Intake/Output Summary (Last 24 hours) at 06/17/11 1400 Last data filed at 06/17/11 1300  Gross per 24 hour  Intake 3348.5 ml  Output   1501 ml  Net 1847.5 ml   Blood pressure 140/53, pulse 59, temperature 97.6 F (36.4 C), temperature source Oral, resp. rate 24, height 5\' 5"  (1.651 m), weight 88.3 kg (194 lb 10.7 oz), SpO2 92.00%.  Physical Exam: General: No acute respiratory distress Lungs: Clear to auscultation bilaterally with exception to bibasilar crackles - no wheeze Cardiovascular: Regular rate and rhythm without murmur gallop or rub normal - distant bs Abdomen: Nontender, nondistended, soft, bowel sounds positive, no rebound, no ascites, no appreciable mass Extremities: No significant cyanosis or clubbing, R  lateral hip wound dressed and dry, left thigh larger than right and quite tight but no distinct hematoma noted, no edema in lower legs/ankles. Neuro:  Alert, sleepy, moves all 4 ext, CNII-XII intact B  Lab Results:  Basename 06/17/11 0500 06/16/11 0500 06/15/11 0655 06/14/11 2228  NA 137 135 135 --  K 3.2* 3.8 4.0 --  CL 104 102 99 --  CO2 24 26 24  --  GLUCOSE 112* 117* 145* --  BUN 39* 68* 87* --  CREATININE 0.81 1.12* 1.67* --  CALCIUM 8.2* 8.1* 8.7 --  MG -- -- -- 2.7*  PHOS -- -- -- 4.1    Basename 06/16/11 0500 06/14/11 2228  AST 44* 89*  ALT 55* 62*  ALKPHOS 30* 39  BILITOT 0.4 0.5  PROT 4.6* 5.5*  ALBUMIN 1.9* 2.1*    Basename 06/17/11 0500 06/16/11 2106 06/16/11 0500  WBC 13.8* 14.6* 12.0*  NEUTROABS -- -- --  HGB 8.9* 9.3* 6.1*  HCT 26.4* 26.9* 18.4*  MCV 81.2 79.8 80.0  PLT 250 251 268    Basename 06/15/11 1516 06/15/11 0655 06/15/11 0011  CKTOTAL 237* 264* 312*  CKMB 2.3 2.5 2.6  CKMBINDEX -- -- --  TROPONINI <0.30 <0.30 <0.30   Micro Results: Recent Results (from the past 240 hour(s))  MRSA PCR SCREENING     Status: Abnormal   Collection Time   06/14/11  8:20 PM      Component Value Range Status Comment   MRSA by PCR POSITIVE (*) NEGATIVE  Final   CULTURE, BLOOD (ROUTINE X 2)     Status: Normal (Preliminary result)   Collection Time  06/15/11 12:00 AM      Component Value Range Status Comment   Specimen Description BLOOD RIGHT HAND   Final    Special Requests BOTTLES DRAWN AEROBIC AND ANAEROBIC 10CC   Final    Culture  Setup Time [id number]   Final    Culture     Final    Value:        BLOOD CULTURE RECEIVED NO GROWTH TO DATE CULTURE WILL BE HELD FOR 5 DAYS BEFORE ISSUING A FINAL NEGATIVE REPORT   Report Status PENDING   Incomplete   CULTURE, BLOOD (ROUTINE X 2)     Status: Normal (Preliminary result)   Collection Time   06/15/11 12:11 AM      Component Value Range Status Comment   Specimen Description BLOOD RIGHT ARM   Final    Special  Requests BOTTLES DRAWN AEROBIC AND ANAEROBIC 10CC   Final    Culture  Setup Time [id number]   Final    Culture     Final    Value:        BLOOD CULTURE RECEIVED NO GROWTH TO DATE CULTURE WILL BE HELD FOR 5 DAYS BEFORE ISSUING A FINAL NEGATIVE REPORT   Report Status PENDING   Incomplete   URINE CULTURE     Status: Normal   Collection Time   06/15/11  3:51 PM      Component Value Range Status Comment   Specimen Description URINE, CATHETERIZED   Final    Special Requests NONE   Final    Culture  Setup Time [id number]   Final    Colony Count NO GROWTH   Final    Culture NO GROWTH   Final    Report Status 06/16/2011 FINAL   Final     Studies/Results: All recent x-ray/radiology reports have been reviewed in detail.   Medications: I have reviewed the patient's complete medication list.  Assessment/Plan:  Pulmonary embolism Continue heparin - will watch Hgb closely on anticoag - I will hold off on switching her to PO anticoagulation until I am certain that she is no longer bleeding into the thigh.  On 2L of O2 currently- pulm status is stable.  Mod pulm HTN without RV failure noted on ECHO.  Anemia- due to acute blood loss into left thigh? Transfused 2 more units - hgb has been stable since yesterday.  Thigh circumference has actually gone down by 1 inch since yesterday and hopefully bleeding has resolved.   R femoral neck fx s/p fall S/p surgical correction at [facility] - partial weight bearing per Orthopedist at [facility] - will consult local Ortho MD if any complications are encountered during rehab process  Altered mental status Likely multifactorial, with contributions from sedating meds, TME related to UTI, severe B12 deficiency, and sundowning - per her daughter, she appears to be at her baseline now.   Acute hypoxic resp failure  resp status appears to be stabilizing at present - f/u CXR suggests no evidence of a focal infiltrate - I do not feel that abx for pna are  indicated   ?Pneumonia - Healthcare Acquired (not at [facility] on admit) As discussed above, CXR and exam are not consistent with an active pulmonary infection -antibiotics for PNA discontinued.  Acute renal failure crt continues to improve - no acute findings on renal US - crt peaked at ~1.97   UTI- MRSA and enterococcus faecalis which is sensitive to Vanc (I have called and obtain final result from [facility])  Will cont vanc  repeat UA/cx are negative.  Acute blood loss anemia (surgery) + probable chronic anemia  May require further transfusion - will have to follow Hgb closely on heparin gtt  SVT/elevated troponin Stable at the present time - assure lytes are balanced - watch on tele   Severe B12 deficiency - daily B12 SQ for 7 day load   H/o hemorrhagic CVA with right sided weakness and speech abnormalities.   Hypokalemia- replace today.   Full Code  Calvert Cantor, MD [facility] Office  [phone] Pager [phone]  On-Call/Text Page:      [email or web address]      password [facility]

## 2011-06-17 NOTE — Progress Notes (Signed)
Pt's right thigh circumference is 61 cm.

## 2011-06-17 NOTE — Progress Notes (Signed)
CSW aware of referral for snf for rehab, csw unable to assess. Pt will be assessed within 24-48 hours. .Clinical social worker continuing to follow pt to assist with pt dc plans and further csw needs.   Catha Gosselin, LCSWA  [phone] .06/17/2011 17:25p

## 2011-06-17 NOTE — Progress Notes (Signed)
ANTICOAGULATION CONSULT NOTE - Follow Up Consult  Pharmacy Consult for heparin/vanc  Indication: pulmonary embolus/ MRSA UTI  Allergies  Allergen Reactions  . Morphine And Related Other (See Comments)    unknown  . Sulfur Other (See Comments)    unknown  . Vicodin (Hydrocodone-Acetaminophen) Other (See Comments)    unknown    Patient Measurements: Height: 5\' 5"  (165.1 cm) Weight: 194 lb 10.7 oz (88.3 kg) IBW/kg (Calculated) : 57  Heparin Dosing Weight: 68kg  Vital Signs: Temp: 98.1 F (36.7 C) (02/01 0803) Temp src: Oral (02/01 0803) BP: 133/47 mmHg (02/01 0803) Pulse Rate: 61  (02/01 0803)  Labs:  Basename 06/17/11 0500 06/16/11 2106 06/16/11 0500 06/15/11 2054 06/15/11 1516 06/15/11 0655 06/15/11 0011 06/14/11 2228  HGB 8.9* 9.3* -- -- -- -- -- --  HCT 26.4* 26.9* 18.4* -- -- -- -- --  PLT 250 251 268 -- -- -- -- --  APTT -- -- -- -- -- -- -- 32  LABPROT -- -- -- -- -- -- -- 17.1*  INR -- -- -- -- -- -- -- 1.37  HEPARINUNFRC 0.44 -- 0.67 0.84* -- -- -- --  CREATININE 0.81 -- 1.12* -- -- 1.67* -- --  CKTOTAL -- -- -- -- 237* 264* 312* --  CKMB -- -- -- -- 2.3 2.5 2.6 --  TROPONINI -- -- -- -- <0.30 <0.30 <0.30 --   Estimated Creatinine Clearance: 63.8 ml/min (by C-G formula based on Cr of 0.81).   Medications:  Heparin infusion at 1050 units/hr  Assessment: 78 yo who has a h/o hemorrhagic CVA/DVT/recent femur fx w/ hemiarthroplasty was tx from [facility] for further care escalation. Patient is currently on heparin infusion for PE. Heparin level = 0.44 this AM. D6 vanc for MRSA/enterococcus UTI.   Goal of Therapy:  Heparin level 0.3-0.7 units/ml   Plan:  Heparin rate to 1100 units/hr F/u am heparin level  Change vanc to 750mg  IV bid  Ulyses [place] 06/17/2011,10:23 AM

## 2011-06-17 NOTE — Progress Notes (Signed)
1800- Pt's right thigh circumference 58cm.

## 2011-06-18 DIAGNOSIS — D62 Acute posthemorrhagic anemia: Secondary | ICD-10-CM | POA: Diagnosis not present

## 2011-06-18 DIAGNOSIS — S72009A Fracture of unspecified part of neck of unspecified femur, initial encounter for closed fracture: Secondary | ICD-10-CM

## 2011-06-18 DIAGNOSIS — I10 Essential (primary) hypertension: Secondary | ICD-10-CM | POA: Diagnosis not present

## 2011-06-18 DIAGNOSIS — R4182 Altered mental status, unspecified: Secondary | ICD-10-CM | POA: Diagnosis not present

## 2011-06-18 DIAGNOSIS — I2699 Other pulmonary embolism without acute cor pulmonale: Secondary | ICD-10-CM | POA: Diagnosis not present

## 2011-06-18 DIAGNOSIS — R5381 Other malaise: Secondary | ICD-10-CM | POA: Diagnosis not present

## 2011-06-18 LAB — CBC
MCHC: 34 g/dL (ref 30.0–36.0)
MCV: 82 fL (ref 78.0–100.0)
Platelets: 271 10*3/uL (ref 150–400)
RDW: 15 % (ref 11.5–15.5)
WBC: 14.3 10*3/uL — ABNORMAL HIGH (ref 4.0–10.5)

## 2011-06-18 LAB — BASIC METABOLIC PANEL
BUN: 18 mg/dL (ref 6–23)
Chloride: 108 mEq/L (ref 96–112)
Creatinine, Ser: 0.63 mg/dL (ref 0.50–1.10)
GFR calc Af Amer: >90 mL/min (ref >90)
GFR calc non Af Amer: 84 mL/min — ABNORMAL LOW (ref >90)

## 2011-06-18 LAB — HEPARIN LEVEL (UNFRACTIONATED)
Heparin Unfractionated: 0.17 IU/mL — ABNORMAL LOW (ref 0.30–0.70)
Heparin Unfractionated: 0.37 IU/mL (ref 0.30–0.70)

## 2011-06-18 MED ORDER — HEPARIN BOLUS VIA INFUSION
1100.0000 [IU] | Freq: Once | INTRAVENOUS | Status: AC
  Administered 2011-06-18: 1100 [IU] via INTRAVENOUS
  Filled 2011-06-18: qty 1100

## 2011-06-18 NOTE — Consult Note (Signed)
Reason for Consult: Pulmonary embolism Referring Physician: [name] is an 78 y.o. female.  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 CHF, asthma, tripped and fell in a parking lot, and was initially seen at [facility] on 1/24. She was found to have right femur neck fracture . She was medically cleared and underwent right hemiarthroplasty on 1/25. On 1/26, pt developed respiratory distress and went into acute hypoxic respiratory failure, she underwent ct angiogram of the chest which showed bilateral pulmonary emboli, bilateral infiltrates and bilateral pleural effusions. She was transferred to ICU, and was started on anticoagulation. Meanwhile she developed MRSA UTI, acute renal failure, and she went into SVT, with troponin leak. Her respiratory status did not improve and she was maintained on venti mask till 1/29. Her anticoagulation was stopped, thinking she might have fat embolism in view of her femur fracture and hemiarthroplasty. We have resumed the Heparin.  Family here- Expands Hx- Patient had neurosurgery  Intervention at [facility] around 2006 with CVA. She had PE complicating that time and was on Warfarin for about a year. Neurologic sequelae resolved and she has been active with ADLs, errands, etc.    Past Medical History  Diagnosis Date  . Asthma   . Headache   . PE (pulmonary embolism)   . DVT (deep venous thrombosis)   . Depression   . CHF (congestive heart failure)   . On home oxygen therapy   . Stroke     Past Surgical History  Procedure Date  . Abdominal hysterectomy   . Craniotomy   . Bladder surgery     No family history on file.  Social History:  reports that she has never smoked. She has never used smokeless tobacco. She reports that she does not drink alcohol or use illicit drugs.  Allergies:  Allergies  Allergen Reactions  . Morphine And  Related Other (See Comments)    unknown  . Sulfur Other (See Comments)    unknown  . Vicodin (Hydrocodone-Acetaminophen) Other (See Comments)    unknown    Medications: I have reviewed the patient's current medications.  Results for orders placed during the hospital encounter of 06/14/11 (from the past 48 hour(s))  CBC     Status: Abnormal   Collection Time   06/16/11  9:06 PM      Component Value Range Comment   WBC 14.6 (*) 4.0 - 10.5 (K/uL)    RBC 3.37 (*) 3.87 - 5.11 (MIL/uL)    Hemoglobin 9.3 (*) 12.0 - 15.0 (g/dL) POST TRANSFUSION SPECIMEN   HCT 26.9 (*) 36.0 - 46.0 (%)    MCV 79.8  78.0 - 100.0 (fL)    MCH 27.6  26.0 - 34.0 (pg)    MCHC 34.6  30.0 - 36.0 (g/dL)    RDW 16.1  09.6 - 04.5 (%)    Platelets 251  150 - 400 (K/uL)   HEPARIN LEVEL (UNFRACTIONATED)     Status: Normal   Collection Time   06/17/11  5:00 AM      Component Value Range Comment   Heparin Unfractionated 0.44  0.30 - 0.70 (IU/mL)   CBC     Status: Abnormal   Collection Time   06/17/11  5:00 AM      Component Value Range Comment   WBC 13.8 (*) 4.0 - 10.5 (K/uL)    RBC 3.25 (*) 3.87 - 5.11 (MIL/uL)  Hemoglobin 8.9 (*) 12.0 - 15.0 (g/dL)    HCT 16.1 (*) 09.6 - 46.0 (%)    MCV 81.2  78.0 - 100.0 (fL)    MCH 27.4  26.0 - 34.0 (pg)    MCHC 33.7  30.0 - 36.0 (g/dL)    RDW 04.5  40.9 - 81.1 (%)    Platelets 250  150 - 400 (K/uL)   BASIC METABOLIC PANEL     Status: Abnormal   Collection Time   06/17/11  5:00 AM      Component Value Range Comment   Sodium 137  135 - 145 (mEq/L)    Potassium 3.2 (*) 3.5 - 5.1 (mEq/L)    Chloride 104  96 - 112 (mEq/L)    CO2 24  19 - 32 (mEq/L)    Glucose, Bld 112 (*) 70 - 99 (mg/dL)    BUN 39 (*) 6 - 23 (mg/dL) DELTA CHECK NOTED   Creatinine, Ser 0.81  0.50 - 1.10 (mg/dL)    Calcium 8.2 (*) 8.4 - 10.5 (mg/dL)    GFR calc non Af Amer 68 (*) >90 (mL/min)    GFR calc Af Amer 79 (*) >90 (mL/min)   OCCULT BLOOD X 1 CARD TO LAB, STOOL     Status: Normal   Collection Time    06/17/11 10:30 AM      Component Value Range Comment   Fecal Occult Bld NEGATIVE     BASIC METABOLIC PANEL     Status: Abnormal   Collection Time   06/17/11  9:09 PM      Component Value Range Comment   Sodium 135  135 - 145 (mEq/L)    Potassium 3.6  3.5 - 5.1 (mEq/L)    Chloride 106  96 - 112 (mEq/L)    CO2 25  19 - 32 (mEq/L)    Glucose, Bld 133 (*) 70 - 99 (mg/dL)    BUN 24 (*) 6 - 23 (mg/dL) DELTA CHECK NOTED   Creatinine, Ser 0.72  0.50 - 1.10 (mg/dL)    Calcium 8.5  8.4 - 10.5 (mg/dL)    GFR calc non Af Amer 81 (*) >90 (mL/min)    GFR calc Af Amer >90  >90 (mL/min)   HEPARIN LEVEL (UNFRACTIONATED)     Status: Abnormal   Collection Time   06/18/11  5:00 AM      Component Value Range Comment   Heparin Unfractionated 0.17 (*) 0.30 - 0.70 (IU/mL)   CBC     Status: Abnormal   Collection Time   06/18/11  5:00 AM      Component Value Range Comment   WBC 14.3 (*) 4.0 - 10.5 (K/uL)    RBC 3.16 (*) 3.87 - 5.11 (MIL/uL)    Hemoglobin 8.8 (*) 12.0 - 15.0 (g/dL)    HCT 91.4 (*) 78.2 - 46.0 (%)    MCV 82.0  78.0 - 100.0 (fL)    MCH 27.8  26.0 - 34.0 (pg)    MCHC 34.0  30.0 - 36.0 (g/dL)    RDW 95.6  21.3 - 08.6 (%)    Platelets 271  150 - 400 (K/uL)   BASIC METABOLIC PANEL     Status: Abnormal   Collection Time   06/18/11  5:00 AM      Component Value Range Comment   Sodium 138  135 - 145 (mEq/L)    Potassium 4.1  3.5 - 5.1 (mEq/L)    Chloride 108  96 - 112 (mEq/L)  CO2 25  19 - 32 (mEq/L)    Glucose, Bld 125 (*) 70 - 99 (mg/dL)    BUN 18  6 - 23 (mg/dL)    Creatinine, Ser 1.61  0.50 - 1.10 (mg/dL)    Calcium 8.2 (*) 8.4 - 10.5 (mg/dL)    GFR calc non Af Amer 84 (*) >90 (mL/min)    GFR calc Af Amer >90  >90 (mL/min)   HEPARIN LEVEL (UNFRACTIONATED)     Status: Normal   Collection Time   06/18/11  5:00 PM      Component Value Range Comment   Heparin Unfractionated 0.37  0.30 - 0.70 (IU/mL)     No results found.   ROS-see HPI Constitutional:   No-   weight loss, night sweats,  fevers, chills, fatigue, lassitude. HEENT:   No-  headaches, difficulty swallowing, tooth/dental problems, sore throat,       No-  sneezing, itching, ear ache, nasal congestion, post nasal drip,  CV:  No-   chest pain, orthopnea, PND, swelling in lower extremities, anasarca, dizziness, palpitations Resp: No-   shortness of breath with exertion or at rest.              No-   productive cough,  No non-productive cough,  No- coughing up of blood.              No-   change in color of mucus.  No- wheezing.   Skin: No-   rash or lesions. GI:  No-   heartburn, indigestion, abdominal pain, nausea, vomiting, diarrhea,                 change in bowel habits, loss of appetite GU:+ dysuria, change in color of urine, no urgency or frequency.  No- flank pain. MS:  No-   joint pain or swelling.  No- decreased range of motion.  No- back pain. Neuro-     nothing unusual Psych:  No- change in mood or affect. No depression or anxiety.  No memory loss.   PHYS EXAM: Blood pressure 148/47, pulse 61, temperature 97.9 F (36.6 C), temperature source Oral, resp. rate 22, height 5\' 5"  (1.651 m), weight 87.5 kg (192 lb 14.4 oz), SpO2 95.00%. OBJ- Physical Exam General- Alert, Oriented, Affect-appropriate, Distress- none acute, smiles easily Skin- rash-none, lesions- none, excoriation- none. No petechiae seen around head/neck/trunk Lymphadenopathy- none Head- atraumatic            Eyes- Gross vision intact, , conjunctivae and secretions clear            Ears- Hearing, canals-normal            Nose- Clear, no-Septal dev, mucus, polyps, erosion, perforation             Throat- Mallampati II , mucosa clear , drainage- none, tonsils- atrophic Neck- flexible , trachea midline, no stridor , thyroid nl, carotid no bruit Chest - symmetrical excursion , unlabored           Heart/CV- RRR with PACs on monitor , no murmur , no gallop  , no rub, nl s1 s2                           - JVD- none , edema- none, stasis changes-  none, varices- none           Lung- clear to P&A, wheeze- none, cough- none , dullness-none, rub- none  Chest wall-  Abd- tender-no, distended-no, bowel sounds-present, HSM- no Br/ Gen/ Rectal- Not done, not indicated Extrem- cyanosis- none, clubbing, none, atrophy- none, strength- nl Neuro- grossly intact to observation     Assessment/Plan: 1) Pulmonary embolism- now on Heparin. This was noted only 1 day after ORIF, which is unusually quick. She had PE before, around 2006, raising the possibility that she had PE now prior to her hip fx, possibly even contributing to that fall.  DDX of Fat Embolism syndrome was considered. Usual clues are hypoxia, altered mental status and petechial rash, without other explanation. Since there is no specific test, we will not know for sure. Dopplers of leg veins showing DVT would indicate thromboembolism. Steroids have some prophylactic benefit for F.E., but not likely to add anything but side-effects this late after the injury. Recommend- Continue managing as venous thromboembolism. Doppler leg veins. Anticipate chronic anticoagulation, if risk of further falls is low.Marland Kitchen  Waymon Budge 06/18/2011, 7:24 PM

## 2011-06-18 NOTE — Progress Notes (Signed)
Pt's right thigh circumference 58cm

## 2011-06-18 NOTE — Progress Notes (Signed)
Pt's right mid thigh measurement 58cm at 1925,confirmed by 2 RN's

## 2011-06-18 NOTE — Progress Notes (Signed)
Spoke with pt's family re: role of CSW/dcp.  Plan is for pt to go to [facility] at a [place] facility, but family unsure of the name of the facility at this time.  CSW will complete FL2 AND BEGIN BED SEARCH process.  Family to provide name of facility they prefer.

## 2011-06-18 NOTE — Progress Notes (Signed)
ANTICOAGULATION CONSULT NOTE - Follow Up Consult  Pharmacy Consult for heparin Indication: pulmonary embolus  Allergies  Allergen Reactions  . Morphine And Related Other (See Comments)    unknown  . Sulfur Other (See Comments)    unknown  . Vicodin (Hydrocodone-Acetaminophen) Other (See Comments)    unknown    Patient Measurements: Height: 5\' 5"  (165.1 cm) Weight: 192 lb 14.4 oz (87.5 kg) IBW/kg (Calculated) : 57  Heparin Dosing Weight: 76.1 kg  Vital Signs: Temp: 97.9 F (36.6 C) (02/02 1624) Temp src: Oral (02/02 1624) BP: 148/47 mmHg (02/02 1700) Pulse Rate: 61  (02/02 1700)  Labs:  Basename 06/18/11 1700 06/18/11 0500 06/17/11 2109 06/17/11 0500 06/16/11 2106  HGB -- 8.8* -- 8.9* --  HCT -- 25.9* -- 26.4* 26.9*  PLT -- 271 -- 250 251  APTT -- -- -- -- --  LABPROT -- -- -- -- --  INR -- -- -- -- --  HEPARINUNFRC 0.37 0.17* -- 0.44 --  CREATININE -- 0.63 0.72 0.81 --  CKTOTAL -- -- -- -- --  CKMB -- -- -- -- --  TROPONINI -- -- -- -- --   Estimated Creatinine Clearance: 64.3 ml/min (by C-G formula based on Cr of 0.63).   Medications:  Heparin infusion at 1100 units/hr  Assessment: 78 yo who has a h/o hemorrhagic CVA/DVT/recent femur fx w/ hemiarthroplasty was tx from [facility] for further care escalation. Patient is currently on heparin infusion for PE. Heparin level = 0.37.  Goal of Therapy:  Heparin level 0.3-0.7 units/ml   Plan:  Continue heparin at 1250 and recheck level in 6 hrs.  Merilynn Finland, [employer] 06/18/2011,6:00 PM

## 2011-06-18 NOTE — Progress Notes (Signed)
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 CHF, asthma, tripped and fell in a parking lot, and was initially seen at [facility] on 1/24. She was found to have right femur neck fracture . She was medically cleared and underwent right hemiarthroplasty on 1/25. On 1/26, pt developed respiratory distress and went into acute hypoxic respiratory failure, she underwent ct angiogram of the chest which showed bilateral pulmonary emboli, bilateral infiltrates and bilateral pleural effusions. She was transferred to ICU, and was started on anticoagulation. Meanwhile she developed MRSA UTI, acute renal failure, and she went into SVT, with troponin leak. Her respiratory status did not improve and she was maintained on venti mask till 1/29.  Her anticoagulation was stopped, thinking she might have fat embolism in view of her femur fracture and hemiarthroplasty. We have resumed the Heparin.   The patient has no complaints. Pain is currently 5/10 in rt thigh.   Objective: Weight change: -0.8 kg (-1 lb 12.2 oz)  Intake/Output Summary (Last 24 hours) at 06/18/11 0902 Last data filed at 06/18/11 0500  Gross per 24 hour  Intake   2630 ml  Output   1950 ml  Net    680 ml   Blood pressure 143/40, pulse 70, temperature 98 F (36.7 C), temperature source Oral, resp. rate 24, height 5\' 5"  (1.651 m), weight 87.5 kg (192 lb 14.4 oz), SpO2 93.00%.  Physical Exam: General: No acute respiratory distress Lungs: Clear to auscultation bilaterally with exception to bibasilar crackles - no wheeze Cardiovascular: regular rate and rhythm without murmur gallop or rub normal  Abdomen: Nontender, nondistended, soft, bowel sounds positive, no rebound, no ascites, no appreciable mass Extremities: No significant cyanosis or clubbing, R lateral hip wound dressed and dry, left  thigh larger than right and quite tight but no distinct hematoma noted, no edema in lower legs/ankles. Neuro:  Alert, sleepy, moves all 4 ext, CNII-XII intact B  Lab Results:  Basename 06/18/11 0500 06/17/11 2109 06/17/11 0500  NA 138 135 137  K 4.1 3.6 3.2*  CL 108 106 104  CO2 25 25 24   GLUCOSE 125* 133* 112*  BUN 18 24* 39*  CREATININE 0.63 0.72 0.81  CALCIUM 8.2* 8.5 8.2*  MG -- -- --  PHOS -- -- --    Basename 06/16/11 0500  AST 44*  ALT 55*  ALKPHOS 30*  BILITOT 0.4  PROT 4.6*  ALBUMIN 1.9*    Basename 06/18/11 0500 06/17/11 0500 06/16/11 2106  WBC 14.3* 13.8* 14.6*  NEUTROABS -- -- --  HGB 8.8* 8.9* 9.3*  HCT 25.9* 26.4* 26.9*  MCV 82.0 81.2 79.8  PLT 271 250 251    Basename 06/15/11 1516  CKTOTAL 237*  CKMB 2.3  CKMBINDEX --  TROPONINI <0.30   Micro Results: Recent Results (from the past 240 hour(s))  MRSA PCR SCREENING     Status: Abnormal   Collection Time   06/14/11  8:20 PM      Component Value Range Status Comment   MRSA by PCR POSITIVE (*) NEGATIVE  Final   CULTURE, BLOOD (ROUTINE X 2)     Status: Normal (Preliminary result)   Collection Time   06/15/11 12:00 AM      Component Value Range Status Comment   Specimen Description BLOOD RIGHT HAND   Final    Special Requests BOTTLES DRAWN AEROBIC AND ANAEROBIC 10CC  Final    Culture  Setup Time [id number]   Final    Culture     Final    Value:        BLOOD CULTURE RECEIVED NO GROWTH TO DATE CULTURE WILL BE HELD FOR 5 DAYS BEFORE ISSUING A FINAL NEGATIVE REPORT   Report Status PENDING   Incomplete   CULTURE, BLOOD (ROUTINE X 2)     Status: Normal (Preliminary result)   Collection Time   06/15/11 12:11 AM      Component Value Range Status Comment   Specimen Description BLOOD RIGHT ARM   Final    Special Requests BOTTLES DRAWN AEROBIC AND ANAEROBIC 10CC   Final    Culture  Setup Time [id number]   Final    Culture     Final    Value:        BLOOD CULTURE RECEIVED NO GROWTH TO DATE CULTURE WILL  BE HELD FOR 5 DAYS BEFORE ISSUING A FINAL NEGATIVE REPORT   Report Status PENDING   Incomplete   URINE CULTURE     Status: Normal   Collection Time   06/15/11  3:51 PM      Component Value Range Status Comment   Specimen Description URINE, CATHETERIZED   Final    Special Requests NONE   Final    Culture  Setup Time [id number]   Final    Colony Count NO GROWTH   Final    Culture NO GROWTH   Final    Report Status 06/16/2011 FINAL   Final     Studies/Results: All recent x-ray/radiology reports have been reviewed in detail.   Medications: I have reviewed the patient's complete medication list.  Assessment/Plan:  Pulmonary embolism Continue heparin - will watch Hgb closely on anticoag - I will hold off on switching her to PO anticoagulation until I am certain that she is no longer bleeding into the thigh.  On 2L of O2 currently- pulm status is stable.  Mod pulm HTN without RV failure noted on ECHO. Have consulted Pulmonary.   Anemia- due to acute blood loss into right thigh? Transfused 2 more units - hgb has been stable since transfusion.  Thigh circumference continues to go down.   R femoral neck fx s/p fall S/p surgical correction at [facility] - partial weight bearing per Orthopedist at [facility] - will consult local Ortho MD if any complications are encountered during rehab process  Altered mental status Likely multifactorial, with contributions from sedating meds, TME related to UTI, severe B12 deficiency, and sundowning - per her daughter, she appears to be at her baseline now.   Acute hypoxic resp failure  resp status appears to be stabilizing at present - f/u CXR suggests no evidence of a focal infiltrate - I do not feel that abx for pna are indicated   ?Pneumonia - Healthcare Acquired (not at [facility] on admit) As discussed above, CXR and exam are not consistent with an active pulmonary infection -antibiotics for PNA discontinued.  Acute renal  failure Has resolved - no acute findings on renal US - crt peaked at ~1.97 - likely was secondary to acute blood loss and UTI.  UTI- MRSA and enterococcus faecalis which is sensitive to Vanc (I have called and obtain final result from [facility])  Will cont vanc for 7 days - started on 1/29 repeat UA/cx are negative.  Acute blood loss anemia (surgery) + probable chronic anemia  May require further transfusion - will have to follow  Hgb closely on heparin gtt  SVT/elevated troponin Stable at the present time - assure lytes are balanced - watch on tele   Severe B12 deficiency - daily B12 SQ for 7 day load   H/o hemorrhagic CVA with right sided weakness and speech abnormalities.   Hypokalemia- resolved with replacement  Full Code  Calvert Cantor, MD [facility] Office  [phone] Pager [phone]  On-Call/Text Page:      [email or web address]      password [facility]

## 2011-06-18 NOTE — Progress Notes (Signed)
Pt's HR intermittently increased to 110-120's for less than a minute 3 times so far this shift. HR baseline is 60-70's. Pt denies any CP, discomfort, or palpitations. Replies that she feels her heart racing when this happens. Dr. Burnadette Peter notified and new orders carried out.

## 2011-06-18 NOTE — Progress Notes (Signed)
ANTICOAGULATION CONSULT NOTE - Follow Up Consult  Pharmacy Consult for heparin/vanc  Indication: pulmonary embolus/ MRSA UTI  Allergies  Allergen Reactions  . Morphine And Related Other (See Comments)    unknown  . Sulfur Other (See Comments)    unknown  . Vicodin (Hydrocodone-Acetaminophen) Other (See Comments)    unknown    Patient Measurements: Height: 5\' 5"  (165.1 cm) Weight: 192 lb 14.4 oz (87.5 kg) IBW/kg (Calculated) : 57  Heparin Dosing Weight: 76.1 kg  Vital Signs: Temp: 98 F (36.7 C) (02/02 0455) Temp src: Oral (02/02 0455) BP: 149/44 mmHg (02/02 0455) Pulse Rate: 74  (02/02 0455)  Labs:  Basename 06/18/11 0500 06/17/11 2109 06/17/11 0500 06/16/11 2106 06/16/11 0500 06/15/11 1516  HGB 8.8* -- 8.9* -- -- --  HCT 25.9* -- 26.4* 26.9* -- --  PLT 271 -- 250 251 -- --  APTT -- -- -- -- -- --  LABPROT -- -- -- -- -- --  INR -- -- -- -- -- --  HEPARINUNFRC 0.17* -- 0.44 -- 0.67 --  CREATININE 0.63 0.72 0.81 -- -- --  CKTOTAL -- -- -- -- -- 237*  CKMB -- -- -- -- -- 2.3  TROPONINI -- -- -- -- -- <0.30   Estimated Creatinine Clearance: 64.3 ml/min (by C-G formula based on Cr of 0.63).   Medications:  Heparin infusion at 1100 units/hr  Assessment: 78 yo who has a h/o hemorrhagic CVA/DVT/recent femur fx w/ hemiarthroplasty was tx from [facility] for further care escalation. Patient is currently on heparin infusion for PE. Heparin level = 0.17 this AM is subtherapeutic, decreased from yesterday. No bleeding or line issues reported. Hgb and plt stable.  Improving renal function. Also D7 vanc for MRSA/enterococcus UTI.   Goal of Therapy:  Heparin level 0.3-0.7 units/ml   Plan:  Heparin bolus 1100 units x1, then increase heparin gtt rate to 1250 units/hr F/u 8h heparin level   Concha Norway 06/18/2011,7:48 AM

## 2011-06-19 DIAGNOSIS — I10 Essential (primary) hypertension: Secondary | ICD-10-CM | POA: Diagnosis not present

## 2011-06-19 DIAGNOSIS — I2699 Other pulmonary embolism without acute cor pulmonale: Secondary | ICD-10-CM | POA: Diagnosis not present

## 2011-06-19 DIAGNOSIS — D62 Acute posthemorrhagic anemia: Secondary | ICD-10-CM | POA: Diagnosis not present

## 2011-06-19 DIAGNOSIS — R4182 Altered mental status, unspecified: Secondary | ICD-10-CM | POA: Diagnosis not present

## 2011-06-19 DIAGNOSIS — S72009A Fracture of unspecified part of neck of unspecified femur, initial encounter for closed fracture: Secondary | ICD-10-CM | POA: Diagnosis not present

## 2011-06-19 DIAGNOSIS — R5381 Other malaise: Secondary | ICD-10-CM | POA: Diagnosis not present

## 2011-06-19 DIAGNOSIS — R609 Edema, unspecified: Secondary | ICD-10-CM

## 2011-06-19 LAB — CBC
Hemoglobin: 9.3 g/dL — ABNORMAL LOW (ref 12.0–15.0)
MCH: 27.8 pg (ref 26.0–34.0)
MCHC: 33.5 g/dL (ref 30.0–36.0)
MCV: 84.1 fL (ref 78.0–100.0)
Platelets: 265 10*3/uL (ref 150–400)
Platelets: 252 10*3/uL (ref 150–400)
RBC: 3.2 MIL/uL — ABNORMAL LOW (ref 3.87–5.11)
RDW: 15.5 % (ref 11.5–15.5)
RDW: 15.4 % (ref 11.5–15.5)
WBC: 15.4 10*3/uL — ABNORMAL HIGH (ref 4.0–10.5)

## 2011-06-19 LAB — HEPARIN LEVEL (UNFRACTIONATED): Heparin Unfractionated: 0.19 IU/mL — ABNORMAL LOW (ref 0.30–0.70)

## 2011-06-19 MED ORDER — RIVAROXABAN 15 MG PO TABS: 15.0000 mg | ORAL_TABLET | Freq: Two times a day (BID) | ORAL | Status: DC

## 2011-06-19 MED ORDER — RIVAROXABAN 10 MG PO TABS: 20.0000 mg | ORAL_TABLET | Freq: Every day | ORAL | Status: DC

## 2011-06-19 MED ORDER — RIVAROXABAN 15 MG PO TABS
15.0000 mg | ORAL_TABLET | Freq: Two times a day (BID) | ORAL | Status: DC
  Administered 2011-06-19 – 2011-06-23 (×9): 15 mg via ORAL
  Filled 2011-06-19 (×10): qty 1

## 2011-06-19 MED ORDER — MIDAZOLAM HCL 2 MG/2ML IJ SOLN: 2.0000 mg | Freq: Once | INTRAMUSCULAR | Status: DC

## 2011-06-19 NOTE — Progress Notes (Signed)
Right thigh circumference 56.5cm. Verified by two RNs

## 2011-06-19 NOTE — Progress Notes (Signed)
Subjective: Michele Summers is an 78 y.o. female.  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 CHF, asthma, tripped and fell in a parking lot, and was initially seen at [facility] on 1/24. She was found to have right femur neck fracture . She was medically cleared and underwent right hemiarthroplasty on 1/25. On 1/26, pt developed respiratory distress and went into acute hypoxic respiratory failure, she underwent ct angiogram of the chest which showed bilateral pulmonary emboli, bilateral infiltrates and bilateral pleural effusions. She was transferred to ICU, and was started on anticoagulation. Meanwhile she developed MRSA UTI, acute renal failure, and she went into SVT, with troponin leak. Her respiratory status did not improve and she was maintained on venti mask till 1/29. Her anticoagulation was stopped, thinking she might have fat embolism in view of her femur fracture and hemiarthroplasty. We have resumed the Heparin.  Family here- Expands Hx- Patient had neurosurgery Intervention at [facility] around 2006 with CVA. She had PE complicating that time and was on Warfarin for about a year. Neurologic sequelae resolved and she has been active with ADLs, errands, etc. I confirmed that she had no awareness of dyspnea cough or leg pain in recent weeks before she fell.    Objective: Vital signs in last 24 hours: Temp:  [97.9 F (36.6 C)-99 F (37.2 C)] 98.3 F (36.8 C) (02/03 1200) Pulse Rate:  [58-92] 92  (02/03 1200) Resp:  [16-25] 20  (02/03 1200) BP: (121-155)/(37-94) 147/50 mmHg (02/03 1200) SpO2:  [90 %-97 %] 93 % (02/03 1200) Weight:  [87 kg (191 lb 12.8 oz)] 87 kg (191 lb 12.8 oz) (02/03 0500)  OBJ- Physical Exam  General- Alert, Oriented, Affect-appropriate, Distress- none acute, smiles easily  Skin- rash-none, lesions- none, excoriation- none. No petechiae seen around  head/neck/trunk  Lymphadenopathy- none  Head- atraumatic  Eyes- Gross vision intact, , conjunctivae and secretions clear  Ears- Hearing, canals-normal  Nose- Clear, no-Septal dev, mucus, polyps, erosion, perforation  Throat- Mallampati II , mucosa clear , drainage- none, tonsils- atrophic  Neck- flexible , trachea midline, no stridor , thyroid nl, carotid no bruit  Chest - symmetrical excursion , unlabored  Heart/CV- RRR with PACs on monitor , no murmur , no gallop , no rub, nl s1 s2  - JVD- none , edema- none, stasis changes- none, varices- none  Lung- clear to P&A, wheeze- none, cough- none , dullness-none, rub- none  Chest wall-  Abd- tender-no, distended-no, bowel sounds-present, HSM- no  Br/ Gen/ Rectal- Not done, not indicated  Extrem- cyanosis- none, clubbing, none, atrophy- none, strength- nl  Neuro-   Lab Results:  Basename 06/19/11 1100 06/19/11 0500  WBC 15.4* 17.4*  HGB 9.0* 9.3*  HCT 26.9* 27.8*  PLT 252 265   BMET  Basename 06/18/11 0500 06/17/11 2109  NA 138 135  K 4.1 3.6  CL 108 106  CO2 25 25  GLUCOSE 125* 133*  BUN 18 24*  CREATININE 0.63 0.72  CALCIUM 8.2* 8.5    Studies/Results: No results found.  Medications: I have reviewed the patient's current medications.  Assessment/Plan: 1) Appropriate to treat for PE by standard protocols. Please call if needed.  LOS: 5 days   YOUNG,CLINTON D 06/19/2011, 1:49 PM

## 2011-06-19 NOTE — Progress Notes (Signed)
*  PRELIMINARY RESULTS* Vascular Ultrasound Lower Extremity Venous Duplex has been completed.  Preliminary findings: Bilateral:  No evidence of DVT or Baker's Cyst.    Farrel Demark RDMS 06/19/2011, 10:43 AM

## 2011-06-19 NOTE — Progress Notes (Signed)
Clinical Social Worker left message with pt's dtr requesting a call back to inquire about faxing pt out.  Weekday CSW to continue to follow and assist as needed.  Angelia Mould, MSW, LCSWA (For [place], [place]) [phone]

## 2011-06-19 NOTE — Progress Notes (Signed)
ANTICOAGULATION CONSULT NOTE - Follow Up Consult  Pharmacy Consult for heparin Indication: pulmonary embolus  Allergies  Allergen Reactions  . Morphine And Related Other (See Comments)    unknown  . Sulfur Other (See Comments)    unknown  . Vicodin (Hydrocodone-Acetaminophen) Other (See Comments)    unknown    Patient Measurements: Height: 5\' 5"  (165.1 cm) Weight: 191 lb 12.8 oz (87 kg) IBW/kg (Calculated) : 57  Heparin Dosing Weight: 76.1 kg  Vital Signs: Temp: 99 F (37.2 C) (02/03 0400) Temp src: Oral (02/03 0400) BP: 154/50 mmHg (02/03 0500) Pulse Rate: 67  (02/03 0500)  Labs:  Basename 06/19/11 0500 06/18/11 1700 06/18/11 0500 06/17/11 2109 06/17/11 0500  HGB 9.3* -- 8.8* -- --  HCT 27.8* -- 25.9* -- 26.4*  PLT 265 -- 271 -- 250  APTT -- -- -- -- --  LABPROT -- -- -- -- --  INR -- -- -- -- --  HEPARINUNFRC 0.19* 0.37 0.17* -- --  CREATININE -- -- 0.63 0.72 0.81  CKTOTAL -- -- -- -- --  CKMB -- -- -- -- --  TROPONINI -- -- -- -- --   Estimated Creatinine Clearance: 64.1 ml/min (by C-G formula based on Cr of 0.63).   Medications:  Heparin infusion at 1250 units/hr  Assessment: 78 yo who has a h/o hemorrhagic CVA/DVT/recent femur fx w/ hemiarthroplasty was tx from [facility] for further care escalation. Patient is currently on heparin infusion for PE. Heparin level = 0.19-subtherapeutic. No issues with line per RN. No bleeding noted.  Goal of Therapy:  Heparin level 0.3-0.7 units/ml   Plan:  Increase heparin to 1450 and recheck level in 8 hrs.  Lavonia Dana 06/19/2011,5:55 AM

## 2011-06-19 NOTE — Progress Notes (Signed)
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 CHF, asthma, tripped and fell in a parking lot, and was initially seen at [facility] on 1/24. She was found to have right femur neck fracture . She was medically cleared and underwent right hemiarthroplasty on 1/25. On 1/26, pt developed respiratory distress and went into acute hypoxic respiratory failure, she underwent ct angiogram of the chest which showed bilateral pulmonary emboli, bilateral infiltrates and bilateral pleural effusions. She was transferred to ICU, and was started on anticoagulation. Meanwhile she developed MRSA UTI, acute renal failure, and she went into SVT, with troponin leak. Her respiratory status did not improve and she was maintained on venti mask till 1/29.  Her anticoagulation was stopped, thinking she might have fat embolism in view of her femur fracture and hemiarthroplasty. We have resumed the Heparin.   She is in a good mood today and has no complaints.   Objective: Weight change: -0.5 kg (-1 lb 1.6 oz)  Intake/Output Summary (Last 24 hours) at 06/19/11 1404 Last data filed at 06/19/11 1305  Gross per 24 hour  Intake 1416.5 ml  Output   3851 ml  Net -2434.5 ml   Blood pressure 147/50, pulse 92, temperature 98.3 F (36.8 C), temperature source Oral, resp. rate 20, height 5\' 5"  (1.651 m), weight 87 kg (191 lb 12.8 oz), SpO2 93.00%.  Physical Exam: General: No acute respiratory distress Lungs: Clear to auscultation bilaterally with exception to bibasilar crackles - no wheeze Cardiovascular: regular rate and rhythm without murmur gallop or rub normal  Abdomen: Nontender, nondistended, soft, bowel sounds positive, no rebound, no ascites, no appreciable mass Extremities: No significant cyanosis or clubbing, R lateral hip wound dressed and dry, no edema in lower  legs/ankles. Neuro:  Alert, sleepy, moves all 4 ext, CNII-XII intact B  Lab Results:  Basename 06/18/11 0500 06/17/11 2109 06/17/11 0500  NA 138 135 137  K 4.1 3.6 3.2*  CL 108 106 104  CO2 25 25 24   GLUCOSE 125* 133* 112*  BUN 18 24* 39*  CREATININE 0.63 0.72 0.81  CALCIUM 8.2* 8.5 8.2*  MG -- -- --  PHOS -- -- --   No results found for this basename: AST:2,ALT:2,ALKPHOS:2,BILITOT:2,PROT:2,ALBUMIN:2 in the last 72 hours  Basename 06/19/11 1100 06/19/11 0500 06/18/11 0500  WBC 15.4* 17.4* 14.3*  NEUTROABS -- -- --  HGB 9.0* 9.3* 8.8*  HCT 26.9* 27.8* 25.9*  MCV 84.1 83.0 82.0  PLT 252 265 271   No results found for this basename: CKTOTAL:3,CKMB:3,CKMBINDEX:3,TROPONINI:3 in the last 72 hours Micro Results: Recent Results (from the past 240 hour(s))  MRSA PCR SCREENING     Status: Abnormal   Collection Time   06/14/11  8:20 PM      Component Value Range Status Comment   MRSA by PCR POSITIVE (*) NEGATIVE  Final   CULTURE, BLOOD (ROUTINE X 2)     Status: Normal (Preliminary result)   Collection Time   06/15/11 12:00 AM      Component Value Range Status Comment   Specimen Description BLOOD RIGHT HAND   Final    Special Requests BOTTLES DRAWN AEROBIC AND ANAEROBIC 10CC   Final    Culture  Setup Time [id number]   Final    Culture     Final    Value:        BLOOD CULTURE RECEIVED NO  GROWTH TO DATE CULTURE WILL BE HELD FOR 5 DAYS BEFORE ISSUING A FINAL NEGATIVE REPORT   Report Status PENDING   Incomplete   CULTURE, BLOOD (ROUTINE X 2)     Status: Normal (Preliminary result)   Collection Time   06/15/11 12:11 AM      Component Value Range Status Comment   Specimen Description BLOOD RIGHT ARM   Final    Special Requests BOTTLES DRAWN AEROBIC AND ANAEROBIC 10CC   Final    Culture  Setup Time [id number]   Final    Culture     Final    Value:        BLOOD CULTURE RECEIVED NO GROWTH TO DATE CULTURE WILL BE HELD FOR 5 DAYS BEFORE ISSUING A FINAL NEGATIVE REPORT   Report Status  PENDING   Incomplete   URINE CULTURE     Status: Normal   Collection Time   06/15/11  3:51 PM      Component Value Range Status Comment   Specimen Description URINE, CATHETERIZED   Final    Special Requests NONE   Final    Culture  Setup Time [id number]   Final    Colony Count NO GROWTH   Final    Culture NO GROWTH   Final    Report Status 06/16/2011 FINAL   Final     Studies/Results: All recent x-ray/radiology reports have been reviewed in detail.   Medications: I have reviewed the patient's complete medication list.  Assessment/Plan:  Pulmonary embolism It was initially suspected that she may have fat emboli as the PE occurred quite suddenly after her fracture. I therefore asked for a PCCM eval to further advise on how to treat. Appreciate PCCM eval. We can continue to treat this as a PE. Bleeding into the right thigh has stopped and I will switch her to Xarelto today. Dopplers of LE are ordered as advised by PCCM.  pulm status is stable- no longer requiring O2 Mod pulm HTN without RV failure noted on ECHO.  Anemia- due to acute blood loss into right thigh? Transfused 2 more units for a total of 4 units- hgb has been stable since transfusion.  Thigh circumference continues to go down.   R femoral neck fx s/p fall S/p surgical correction at [facility] - partial weight bearing per Orthopedist at [facility] - will consult local Ortho MD if any complications are encountered during rehab process  Altered mental status Likely multifactorial, with contributions from sedating meds, TME related to UTI, severe B12 deficiency, and sundowning - per her daughter, she appears to be at her baseline now.   Acute hypoxic resp failure  resp status appears to be stabilizing at present - f/u CXR suggests no evidence of a focal infiltrate - I do not feel that abx for pna are indicated   ?Pneumonia - Healthcare Acquired (not at [facility] on admit) As discussed above, CXR and exam  are not consistent with an active pulmonary infection -antibiotics for PNA discontinued.  Acute renal failure Has resolved - no acute findings on renal US - crt peaked at ~1.97 - likely was secondary to acute blood loss and UTI.  UTI- MRSA and enterococcus faecalis which is sensitive to Vanc (I have called and obtain final result from [facility])  Will cont vanc for 7 days - started on 1/29 repeat UA/cx are negative.  Acute blood loss anemia (surgery) + probable chronic anemia   SVT/elevated troponin Likely due to stress of surgery, acute  PEs and blood loss  Severe B12 deficiency - daily B12 SQ for 7 day load   H/o hemorrhagic CVA with right sided weakness and speech abnormalities.   Hypokalemia- resolved with replacement  Full Code  Calvert Cantor, MD [facility] Office  [phone] Pager [phone]  On-Call/Text Page:      [email or web address]      password [facility]

## 2011-06-20 DIAGNOSIS — I2699 Other pulmonary embolism without acute cor pulmonale: Secondary | ICD-10-CM

## 2011-06-20 DIAGNOSIS — S72009A Fracture of unspecified part of neck of unspecified femur, initial encounter for closed fracture: Secondary | ICD-10-CM

## 2011-06-20 DIAGNOSIS — R4182 Altered mental status, unspecified: Secondary | ICD-10-CM | POA: Diagnosis not present

## 2011-06-20 DIAGNOSIS — R5381 Other malaise: Secondary | ICD-10-CM | POA: Diagnosis not present

## 2011-06-20 DIAGNOSIS — D62 Acute posthemorrhagic anemia: Secondary | ICD-10-CM | POA: Diagnosis not present

## 2011-06-20 DIAGNOSIS — I10 Essential (primary) hypertension: Secondary | ICD-10-CM | POA: Diagnosis not present

## 2011-06-20 LAB — BASIC METABOLIC PANEL
CO2: 28 mEq/L (ref 19–32)
Chloride: 102 mEq/L (ref 96–112)
Creatinine, Ser: 0.7 mg/dL (ref 0.50–1.10)
GFR calc Af Amer: >90 mL/min (ref >90)
Sodium: 135 mEq/L (ref 135–145)

## 2011-06-20 NOTE — Progress Notes (Signed)
CSW spoke with pt and pt two daughters regarding snf search in [address], which everyone agreed and hoping for [facility] in [place]. .Clinical social worker continuing to follow pt to assist with pt dc plans and further csw needs.    Catha Gosselin, Theresia Majors  [phone] .06/20/2011 17:12pm

## 2011-06-20 NOTE — Progress Notes (Signed)
Physical Therapy Treatment Patient Details Name: Michele Summers MRN: [id number] DOB: 02/23/1932 Today's Date: 06/20/2011  PT Assessment/Plan  PT - Assessment/Plan Comments on Treatment Session: Better with exercises. Family reports working on them over the weekend. Pt with POOR activity tolerance. Attempted to take steps today but not able to weight shift to right because of pain/weakness. At end of session and pt sitting in chair pt's heart rate rose to 154 but quickly came back down to 66 bpm. RN notified and aware. Pt with c/o being hot and dizzy throughout treatment session.  PT Plan: Discharge plan remains appropriate;Frequency remains appropriate Follow Up Recommendations: Skilled nursing facility Equipment Recommended: Defer to next venue PT Goals  Acute Rehab PT Goals PT Goal: Supine/Side to Sit - Progress: Progressing toward goal PT Goal: Sit at [facility] Of Bed - Progress: Progressing toward goal PT Goal: Sit to Stand - Progress: Progressing toward goal PT Goal: Stand to Sit - Progress: Progressing toward goal PT Transfer Goal: Bed to Chair/Chair to Bed - Progress: Progressing toward goal PT Goal: Stand - Progress: Progressing toward goal PT Goal: Ambulate - Progress: Progressing toward goal PT Goal: Perform Home Exercise Program - Progress: Progressing toward goal  PT Treatment Precautions/Restrictions  Precautions Precautions: Fall Precaution Comments: Rt hip hemiarthroplasty Restrictions Weight Bearing Restrictions: Yes RLE Weight Bearing: Partial weight bearing RLE Partial Weight Bearing Percentage or Pounds: 50% Mobility (including Balance) Bed Mobility Supine to Sit: 1: +2 Total assist;HOB elevated (Comment degrees) (45%) Supine to Sit Details (indicate cue type and reason): pt initiating moving legs off bed with modA but still needing +2totalpt45% to bring trunk upright from bed Sitting - Scoot to Edge of Bed: 3: Mod assist Transfers Sit to Stand: 1: +2 Total assist  (40%) Sit to Stand Details (indicate cue type and reason): max faciliation at hips/sacrum for upright posture and follow through to stand; responds well to max verbal cues to extend hips/tall posture but poor ability to maintain secondary to fatigue Stand to Sit: 1: +2 Total assist Ambulation/Gait Ambulation/Gait: Yes Ambulation/Gait Assistance: 1: +2 Total assist Ambulation/Gait Assistance Details (indicate cue type and reason): attempted one step with RLE but pt unable to maintain PWB and use of RW for weight shift to bring LLE through; pt leaning significantly to right needing maxA to stay upright; pt manually pivoted to chair because of fatigue (poor effort on her part once she becomes fatigued she almost gives up) Gait Pattern: Lateral trunk lean to right;Decreased weight shift to right    Exercise  Total Joint Exercises Ankle Circles/Pumps: AAROM;10 reps;Both;Supine Quad Sets: AAROM;Right;10 reps;Supine Heel Slides: AAROM;Both;10 reps;Supine Hip ABduction/ADduction: 10 reps;Supine;Right;AAROM End of Session PT - End of Session Equipment Utilized During Treatment: Gait belt Activity Tolerance: Patient limited by fatigue;Patient limited by pain Patient left: in chair;with call bell in reach;with family/visitor present Nurse Communication: Mobility status for transfers;Mobility status for ambulation;Weight bearing status General Behavior During Session: [facility] for tasks performed Cognition: [facility] for tasks performed  [facility] HELEN 06/20/2011, 1:26 PM

## 2011-06-20 NOTE — Progress Notes (Signed)
Subjective: Michele Summers is an 78 y.o. female.  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 CHF, asthma, tripped and fell in a parking lot, and was initially seen at [facility] on 1/24. She was found to have right femur neck fracture . She was medically cleared and underwent right hemiarthroplasty on 1/25. On 1/26, pt developed respiratory distress and went into acute hypoxic respiratory failure, she underwent ct angiogram of the chest which showed bilateral pulmonary emboli, bilateral infiltrates and bilateral pleural effusions. She was transferred to ICU, and was started on anticoagulation. Meanwhile she developed MRSA UTI, acute renal failure, and she went into SVT, with troponin leak. Her respiratory status did not improve and she was maintained on venti mask till 1/29. Her anticoagulation was stopped, thinking she might have fat embolism in view of her femur fracture and hemiarthroplasty. We have resumed the Heparin.  Family here- Expands Hx- Patient had neurosurgery Intervention at [facility] around 2006 with CVA. She had PE complicating that time and was on Warfarin for about a year. Neurologic sequelae resolved and she has been active with ADLs, errands, etc. I confirmed that she had no awareness of dyspnea cough or leg pain in recent weeks before she fell.    Overnight: feeling much better, no complaints.  Objective: Vital signs in last 24 hours: Temp:  [97.6 F (36.4 C)-99 F (37.2 C)] 97.6 F (36.4 C) (02/04 1616) Pulse Rate:  [58-154] 61  (02/04 1616) Resp:  [16-25] 17  (02/04 1616) BP: (116-153)/(39-84) 138/47 mmHg (02/04 1616) SpO2:  [90 %-98 %] 92 % (02/04 1616) Weight:  [191 lb 12.8 oz (87 kg)] 191 lb 12.8 oz (87 kg) (02/04 0500)  Gen: sitting in chair, comfortable PULM: CTA B CV: RRR, no mgr Derm: scattered brusing noted Ext: warm, minimal edema Neuro: A&Ox4,  maew   Lab Results:  Basename 06/19/11 1100 06/19/11 0500  WBC 15.4* 17.4*  HGB 9.0* 9.3*  HCT 26.9* 27.8*  PLT 252 265   BMET  Basename 06/20/11 1000 06/18/11 0500  NA 135 138  K 4.8 4.1  CL 102 108  CO2 28 25  GLUCOSE 135* 125*  BUN 11 18  CREATININE 0.70 0.63  CALCIUM 9.0 8.2*    Studies/Results: No results found.   Assessment/Plan:  78 y/o female   78 y/o female with pulmonary embolism which occurred in the peri-operative period from hip ORIF.  No evidence of fat emboli.  This is her second episode of pulmonary embolism.  1) recommend indefinite anticoagulation given recurrent disease  PCCM will sign off, call if questions  LOS: 6 days   Michele Summers 06/20/2011, 5:53 PM

## 2011-06-20 NOTE — Progress Notes (Signed)
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 CHF, asthma, tripped and fell in a parking lot, and was initially seen at [facility] on 1/24. She was found to have right femur neck fracture . She was medically cleared and underwent right hemiarthroplasty on 1/25. On 1/26, pt developed respiratory distress and went into acute hypoxic respiratory failure, she underwent ct angiogram of the chest which showed bilateral pulmonary emboli, bilateral infiltrates and bilateral pleural effusions. She was transferred to ICU, and was started on anticoagulation. Meanwhile she developed MRSA UTI, acute renal failure, and she went into SVT, with troponin leak. Her respiratory status did not improve and she was maintained on venti mask till 1/29.  Her anticoagulation was stopped, thinking she might have fat embolism in view of her femur fracture and hemiarthroplasty. We have resumed the Heparin.   Alert, endorses fatigue. Daughter at the bedside.  Objective: Weight change: 0 kg (0 lb)  Intake/Output Summary (Last 24 hours) at 06/20/11 1052 Last data filed at 06/20/11 0928  Gross per 24 hour  Intake    900 ml  Output   3750 ml  Net  -2850 ml   Blood pressure 141/46, pulse 60, temperature 97.8 F (36.6 C), temperature source Oral, resp. rate 20, height 5\' 5"  (1.651 m), weight 87 kg (191 lb 12.8 oz), SpO2 91.00%.  Physical Exam: General: No acute respiratory distress but appears very deconditioned Lungs: Clear to auscultation bilaterally with exception to bibasilar crackles - no wheeze, on room air Cardiovascular: regular rate and rhythm without murmur gallop or rub normal  Abdomen: Nontender, nondistended, soft, bowel sounds positive, no rebound, no ascites, no appreciable mass Extremities: No significant cyanosis or clubbing, R lateral hip wound dressed  and dry, no edema in lower legs/ankles. Neuro:  Alert, sleepy, moves all 4 ext, CNII-XII intact B  Lab Results:  Basename 06/18/11 0500 06/17/11 2109  NA 138 135  K 4.1 3.6  CL 108 106  CO2 25 25  GLUCOSE 125* 133*  BUN 18 24*  CREATININE 0.63 0.72  CALCIUM 8.2* 8.5  MG -- --  PHOS -- --   No results found for this basename: AST:2,ALT:2,ALKPHOS:2,BILITOT:2,PROT:2,ALBUMIN:2 in the last 72 hours  Basename 06/19/11 1100 06/19/11 0500 06/18/11 0500  WBC 15.4* 17.4* 14.3*  NEUTROABS -- -- --  HGB 9.0* 9.3* 8.8*  HCT 26.9* 27.8* 25.9*  MCV 84.1 83.0 82.0  PLT 252 265 271   No results found for this basename: CKTOTAL:3,CKMB:3,CKMBINDEX:3,TROPONINI:3 in the last 72 hours Micro Results: Recent Results (from the past 240 hour(s))  MRSA PCR SCREENING     Status: Abnormal   Collection Time   06/14/11  8:20 PM      Component Value Range Status Comment   MRSA by PCR POSITIVE (*) NEGATIVE  Final   CULTURE, BLOOD (ROUTINE X 2)     Status: Normal (Preliminary result)   Collection Time   06/15/11 12:00 AM      Component Value Range Status Comment   Specimen Description BLOOD RIGHT HAND   Final    Special Requests BOTTLES DRAWN AEROBIC AND ANAEROBIC 10CC   Final    Culture  Setup Time [id number]   Final    Culture     Final    Value:        BLOOD CULTURE RECEIVED NO GROWTH TO DATE CULTURE WILL BE HELD FOR  5 DAYS BEFORE ISSUING A FINAL NEGATIVE REPORT   Report Status PENDING   Incomplete   CULTURE, BLOOD (ROUTINE X 2)     Status: Normal (Preliminary result)   Collection Time   06/15/11 12:11 AM      Component Value Range Status Comment   Specimen Description BLOOD RIGHT ARM   Final    Special Requests BOTTLES DRAWN AEROBIC AND ANAEROBIC 10CC   Final    Culture  Setup Time [id number]   Final    Culture     Final    Value:        BLOOD CULTURE RECEIVED NO GROWTH TO DATE CULTURE WILL BE HELD FOR 5 DAYS BEFORE ISSUING A FINAL NEGATIVE REPORT   Report Status PENDING   Incomplete     URINE CULTURE     Status: Normal   Collection Time   06/15/11  3:51 PM      Component Value Range Status Comment   Specimen Description URINE, CATHETERIZED   Final    Special Requests NONE   Final    Culture  Setup Time [id number]   Final    Colony Count NO GROWTH   Final    Culture NO GROWTH   Final    Report Status 06/16/2011 FINAL   Final     Studies/Results: All recent x-ray/radiology reports have been reviewed in detail.   Medications: I have reviewed the patient's complete medication list.  Assessment/Plan:  Pulmonary embolism Had PE in 2006. PCCM suspects due to rapid onset of symptoms likely had PE prior to admit and this possibly precipitated her fall. They recommend lifelong Coumadin Appreciate PCCM eval. We can continue to treat this as a PE. Bleeding into the right thigh has stopped and was switched to Xarelto 06/18/2011.  Dopplers of LE were ordered as advised by PCCM shows no DVT.  pulm status is stable- no longer requiring O2 Mod pulm HTN without RV failure noted on ECHO.  Anemia- due to acute blood loss into right thigh? Transfused 2 more units for a total of 4 units- hgb has been stable since transfusion.  Thigh circumference continues to go down.   R femoral neck fx s/p fall S/p surgical correction at [facility] - partial weight bearing per Orthopedist at [facility] - will consult local Ortho MD if any complications are encountered during rehab process. Will dc foley and encourage mobilization.  Altered mental status Likely multifactorial, with contributions from sedating meds, TME related to UTI, severe B12 deficiency, and sundowning - per her daughter, she appears to be at her baseline now.   Acute hypoxic resp failure  resp status appears to be stabilizing at present - f/u CXR suggests no evidence of a focal infiltrate - I do not feel that abx for pna are indicated   ?Pneumonia - Healthcare Acquired (not at [facility] on admit) As discussed  above, CXR and exam are not consistent with an active pulmonary infection -antibiotics for PNA discontinued.  Acute renal failure Has resolved - no acute findings on renal US - crt peaked at ~1.97 - likely was secondary to acute blood loss and UTI.  UTI- MRSA and enterococcus faecalis which is sensitive to Vanc (I have called and obtain final result from [facility])  Will cont vanc for 7 days - started on 1/29 repeat UA/cx are negative.  Acute blood loss anemia (surgery) + probable chronic anemia   SVT/elevated troponin Likely due to stress of surgery, acute PEs and blood loss  Severe B12 deficiency - daily B12 SQ for 7 day load then once a week for 4 weeks then Q month  H/o hemorrhagic CVA with right sided weakness and speech abnormalities.   Hypokalemia- resolved with replacement- will check BMET and hold current dose of scheduled KCL- likley can dc/  Full Code  Junious Silk, ANP [facility] Office  [phone] Pager [phone]  On-Call/Text Page:      [email or web address]      password [email or web address]  I have personally examined this patient and reviewed the entire database. I have reviewed the above note, made any necessary editorial changes, and agree with its content.  Lonia Blood, MD [facility]

## 2011-06-21 DIAGNOSIS — D62 Acute posthemorrhagic anemia: Secondary | ICD-10-CM | POA: Diagnosis not present

## 2011-06-21 DIAGNOSIS — R4182 Altered mental status, unspecified: Secondary | ICD-10-CM | POA: Diagnosis not present

## 2011-06-21 DIAGNOSIS — I10 Essential (primary) hypertension: Secondary | ICD-10-CM | POA: Diagnosis not present

## 2011-06-21 DIAGNOSIS — R5381 Other malaise: Secondary | ICD-10-CM | POA: Diagnosis not present

## 2011-06-21 LAB — URINALYSIS, MICROSCOPIC ONLY
Glucose, UA: NEGATIVE mg/dL
Nitrite: NEGATIVE
Specific Gravity, Urine: 1.01 (ref 1.005–1.030)
pH: 6.5 (ref 5.0–8.0)

## 2011-06-21 LAB — CULTURE, BLOOD (ROUTINE X 2)
Culture: NO GROWTH
Culture: NO GROWTH

## 2011-06-21 LAB — BASIC METABOLIC PANEL
CO2: 29 mEq/L (ref 19–32)
Calcium: 9 mg/dL (ref 8.4–10.5)
GFR calc non Af Amer: 80 mL/min — ABNORMAL LOW (ref >90)
Glucose, Bld: 115 mg/dL — ABNORMAL HIGH (ref 70–99)
Potassium: 4.4 mEq/L (ref 3.5–5.1)
Sodium: 134 mEq/L — ABNORMAL LOW (ref 135–145)

## 2011-06-21 LAB — CBC
Hemoglobin: 8.8 g/dL — ABNORMAL LOW (ref 12.0–15.0)
MCV: 83.5 fL (ref 78.0–100.0)
Platelets: 282 10*3/uL (ref 150–400)
RBC: 3.21 MIL/uL — ABNORMAL LOW (ref 3.87–5.11)
WBC: 15.2 10*3/uL — ABNORMAL HIGH (ref 4.0–10.5)

## 2011-06-21 NOTE — Progress Notes (Signed)
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 CHF, asthma, tripped and fell in a parking lot, and was initially seen at [facility] on 1/24. She was found to have right femur neck fracture . She was medically cleared and underwent right hemiarthroplasty on 1/25. On 1/26, pt developed respiratory distress and went into acute hypoxic respiratory failure, she underwent ct angiogram of the chest which showed bilateral pulmonary emboli, bilateral infiltrates and bilateral pleural effusions. She was transferred to ICU, and was started on anticoagulation. Meanwhile she developed MRSA UTI, acute renal failure, and she went into SVT, with troponin leak. Her respiratory status did not improve and she was maintained on venti mask till 1/29.  Her anticoagulation was stopped, thinking she might have fat embolism in view of her femur fracture and hemiarthroplasty. We have resumed the Heparin.   Subjective: Looks much better today. He is up in chair watching TV eating breakfast and talking animatedly with her daughter. No complaints were verbalized.  Objective: Weight change: -4.86 kg (-10 lb 11.4 oz)  Intake/Output Summary (Last 24 hours) at 06/21/11 0953 Last data filed at 06/21/11 0500  Gross per 24 hour  Intake    840 ml  Output   2450 ml  Net  -1610 ml   Blood pressure 126/64, pulse 65, temperature 98.3 F (36.8 C), temperature source Oral, resp. rate 22, height 5\' 5"  (1.651 m), weight 82.14 kg (181 lb 1.4 oz), SpO2 92.00%.  Physical Exam: General: No acute respiratory distress but appears very deconditioned Lungs: Clear to auscultation bilaterally with exception to bibasilar crackles - no wheeze, on room air Cardiovascular: regular rate and rhythm without murmur gallop or rub normal  Abdomen: Nontender, nondistended, soft, bowel sounds  positive, no rebound, no ascites, no appreciable mass Extremities: No significant cyanosis or clubbing, R lateral hip wound dressed and dry, no edema in lower legs/ankles. Neuro:  Alert, sleepy, moves all 4 ext, CNII-XII intact B  Lab Results:  Basename 06/21/11 0500 06/20/11 1000  NA 134* 135  K 4.4 4.8  CL 100 102  CO2 29 28  GLUCOSE 115* 135*  BUN 10 11  CREATININE 0.75 0.70  CALCIUM 9.0 9.0  MG -- --  PHOS -- --   No results found for this basename: AST:2,ALT:2,ALKPHOS:2,BILITOT:2,PROT:2,ALBUMIN:2 in the last 72 hours  Basename 06/21/11 0500 06/19/11 1100 06/19/11 0500  WBC 15.2* 15.4* 17.4*  NEUTROABS -- -- --  HGB 8.8* 9.0* 9.3*  HCT 26.8* 26.9* 27.8*  MCV 83.5 84.1 83.0  PLT 282 252 265   No results found for this basename: CKTOTAL:3,CKMB:3,CKMBINDEX:3,TROPONINI:3 in the last 72 hours Micro Results: Recent Results (from the past 240 hour(s))  MRSA PCR SCREENING     Status: Abnormal   Collection Time   06/14/11  8:20 PM      Component Value Range Status Comment   MRSA by PCR POSITIVE (*) NEGATIVE  Final   CULTURE, BLOOD (ROUTINE X 2)     Status: Normal (Preliminary result)   Collection Time   06/15/11 12:00 AM      Component Value Range Status Comment   Specimen Description BLOOD RIGHT HAND   Final    Special Requests BOTTLES DRAWN AEROBIC AND ANAEROBIC 10CC   Final    Culture  Setup Time [id number]   Final    Culture     Final  Value:        BLOOD CULTURE RECEIVED NO GROWTH TO DATE CULTURE WILL BE HELD FOR 5 DAYS BEFORE ISSUING A FINAL NEGATIVE REPORT   Report Status PENDING   Incomplete   CULTURE, BLOOD (ROUTINE X 2)     Status: Normal (Preliminary result)   Collection Time   06/15/11 12:11 AM      Component Value Range Status Comment   Specimen Description BLOOD RIGHT ARM   Final    Special Requests BOTTLES DRAWN AEROBIC AND ANAEROBIC 10CC   Final    Culture  Setup Time [id number]   Final    Culture     Final    Value:        BLOOD CULTURE RECEIVED NO  GROWTH TO DATE CULTURE WILL BE HELD FOR 5 DAYS BEFORE ISSUING A FINAL NEGATIVE REPORT   Report Status PENDING   Incomplete   URINE CULTURE     Status: Normal   Collection Time   06/15/11  3:51 PM      Component Value Range Status Comment   Specimen Description URINE, CATHETERIZED   Final    Special Requests NONE   Final    Culture  Setup Time [id number]   Final    Colony Count NO GROWTH   Final    Culture NO GROWTH   Final    Report Status 06/16/2011 FINAL   Final     Studies/Results: All recent x-ray/radiology reports have been reviewed in detail.   Medications: I have reviewed the patient's complete medication list.  Assessment/Plan:  Pulmonary embolism Had PE in 2006. PCCM suspects due to rapid onset of symptoms likely had PE prior to admit and this possibly precipitated her fall. They recommend lifelong Coumadin Appreciate PCCM eval. We can continue to treat this as a PE. Bleeding into the right thigh has stopped and was switched to Xarelto 06/18/2011.  Dopplers of LE were ordered as advised by PCCM shows no DVT.  pulm status is stable- no longer requiring O2 Mod pulm HTN without RV failure noted on ECHO.  Anemia - due to acute blood loss into right thigh? Transfused 2 more units for a total of 4 units- hgb has been stable since transfusion.  Thigh circumference continues to go down.   R femoral neck fx s/p fall S/p surgical correction at [facility] - partial weight bearing per Orthopedist at [facility] - will consult local Ortho MD if any complications are encountered during rehab process. Will dc foley and encourage mobilization.  Altered mental status Likely multifactorial, with contributions from sedating meds, TME related to UTI, severe B12 deficiency, and sundowning - per her daughter, she appears to be at her baseline now.   Acute hypoxic resp failure  resp status appears to be stabilizing at present - f/u CXR suggests no evidence of a focal infiltrate - I do not  feel that abx for pna are indicated   ?Pneumonia - Healthcare Acquired (not at [facility] on admit) As discussed above, CXR and exam are not consistent with an active pulmonary infection -antibiotics for PNA discontinued.  Acute renal failure Has resolved - no acute findings on renal US - crt peaked at ~1.97 - likely was secondary to acute blood loss and UTI.  UTI- MRSA and enterococcus faecalis which is sensitive to Vanc (I have called and obtain final result from [facility])  Will cont vanc for 7 days - started on 1/29- repeat UA/cx are negative. Today is the last day.  Has progressive leukocytosis and likely due to indwelling Foley catheter therefore we'll remove catheter today and repeat urinalysis and culture.  Acute blood loss anemia (surgery) + probable chronic anemia  Hemoglobin remained stable after initiation of Xarelto  SVT/elevated troponin Likely due to stress of surgery, acute PEs and blood loss  Severe B12 deficiency - daily B12 SQ for 7 day load then once a week for 4 weeks then Q month  H/o hemorrhagic CVA with right sided weakness and speech abnormalities.   Hypokalemia- resolved with replacement- will check BMET and hold current dose of scheduled KCL- likley can dc/  Full Code  Disposition Transfer to non-telemetry medical floor. Hopefully we will be able to transfer her to a skilled rehab facility near [place] later this week. They are requesting [facility].   Junious Silk, ANP [facility] Office  [phone] Pager [phone]  On-Call/Text Page:      [email or web address]      password [email or web address]  II have examined the patient and reviewed the chart. I have modified the above note and agree with it.   Calvert Cantor, MD [phone]

## 2011-06-21 NOTE — Progress Notes (Signed)
Occupational Therapy Treatment Patient Details Name: Michele Summers MRN: [id number] DOB: 21-Jul-1931 Today's Date: 06/21/2011  OT Assessment/Plan OT Assessment/Plan Comments on Treatment Session: Pt. with decreased activity tolerance and fatigues very quickly. OT Plan: Discharge plan remains appropriate OT Frequency: Min 1X/week Follow Up Recommendations: Skilled nursing facility Equipment Recommended: Defer to next venue OT Goals Acute Rehab OT Goals OT Goal Formulation: With patient Time For Goal Achievement: 2 weeks ADL Goals Pt Will Perform Grooming: with min assist;Standing at sink ADL Goal: Grooming - Progress: Progressing toward goals Pt Will Perform Upper Body Dressing: with set-up;Sitting, bed;Unsupported ADL Goal: Upper Body Dressing - Progress: Progressing toward goals Pt Will Perform Lower Body Dressing: with mod assist;Sit to stand from bed;Unsupported ADL Goal: Lower Body Dressing - Progress: Not met Pt Will Transfer to Toilet: with mod assist;Stand pivot transfer;with DME ADL Goal: Toilet Transfer - Progress: Progressing toward goals Pt Will Perform Toileting - Clothing Manipulation: with min assist;Standing ADL Goal: Toileting - Clothing Manipulation - Progress: Not met Pt Will Perform Toileting - Hygiene: with min assist;Sitting on 3-in-1 or toilet ADL Goal: Toileting - Hygiene - Progress: Not met  OT Treatment Precautions/Restrictions  Precautions Precautions: Fall Precaution Comments: rt hip hemiarthroplasty Restrictions Weight Bearing Restrictions: Yes RLE Weight Bearing: Partial weight bearing RLE Partial Weight Bearing Percentage or Pounds: 50%   ADL ADL Grooming: Performed;Wash/dry face;Set up Where Assessed - Grooming: Sitting, chair Upper Body Dressing: Performed;Minimal assistance Upper Body Dressing Details (indicate cue type and reason): With donning gown Where Assessed - Upper Body Dressing: Sitting, chair Toilet Transfer: Simulated;+2 Total  assistance;Comment for patient % (pt=50%) Toilet Transfer Details (indicate cue type and reason): Max verbal cues for hip precautions and hand placement on arm rests Toilet Transfer Method: Proofreader: Other (comment) (recliner) Mobility  Transfers Sit to Stand: 1: +2 Total assist;From chair/3-in-1;With armrests (pt=50%) Sit to Stand Details (indicate cue type and reason): Manual facilitation to elevate hips from chair and promote upright positioning. hand over hand assist to transition UE support from arm rests to RW Stand to Sit: 1: +2 Total assist;To chair/3-in-1 (50%) Stand to Sit Details: assist to lower hips to chair    End of Session OT - End of Session Equipment Utilized During Treatment: Gait belt Activity Tolerance: Patient limited by pain Patient left: with call bell in reach;in chair Nurse Communication: Mobility status for transfers General Behavior During Session: [facility] for tasks performed Cognition: [facility] for tasks performed  Ollivander See, OTR/L Pager [phone]  06/21/2011, 2:06 PM

## 2011-06-21 NOTE — Progress Notes (Signed)
Physical Therapy Treatment Patient Details Name: Michele Summers MRN: [id number] DOB: [birth year] Today's Date: 06/21/2011  PT Assessment/Plan  PT - Assessment/Plan Comments on Treatment Session: Tooks some steps today. Still VERY limited by activity tolerance.  PT Plan: Discharge plan remains appropriate;Frequency remains appropriate Follow Up Recommendations: Skilled nursing facility Equipment Recommended: Defer to next venue PT Goals  Acute Rehab PT Goals PT Goal: Sit to Stand - Progress: Progressing toward goal PT Transfer Goal: Bed to Chair/Chair to Bed - Progress: Progressing toward goal PT Goal: Stand - Progress: Progressing toward goal PT Goal: Ambulate - Progress: Progressing toward goal PT Goal: Perform Home Exercise Program - Progress: Progressing toward goal  PT Treatment Precautions/Restrictions  Precautions Precautions: Fall Precaution Comments: rt hip hemiarthroplasty Restrictions Weight Bearing Restrictions: Yes RLE Weight Bearing: Partial weight bearing RLE Partial Weight Bearing Percentage or Pounds: 50% Mobility (including Balance) Transfers Sit to Stand: 1: +2 Total assist;From chair/3-in-1;With armrests (50%) Sit to Stand Details (indicate cue type and reason): bilateral facilitation with use of pad to elevate hips from chair and assist with follow through to stand; max verbal cues for sequencing of hands onto RW Stand to Sit: 1: +2 Total assist;To chair/3-in-1 (50%) Stand to Sit Details: assist to lower hips to chair Ambulation/Gait Ambulation/Gait Assistance: 1: +2 Total assist (45%) Ambulation/Gait Assistance Details (indicate cue type and reason): sequencing cues especially for upright posture, to push down through RW during steps and assist to advance bilateral lower extremities with assist to weight shift Ambulation Distance (Feet): 4 Feet Assistive device: Rolling walker Gait Pattern: Antalgic;Trunk rotated posteriorly on right;Trunk flexed;Decreased  stance time - right;Decreased step length - left;Decreased step length - right;Step-to pattern    Exercise  Total Joint Exercises Ankle Circles/Pumps: AROM;Both;10 reps;Seated Long Arc Quad: AROM;Both;10 reps;Seated End of Session PT - End of Session Equipment Utilized During Treatment: Gait belt Activity Tolerance: Patient limited by pain;Patient limited by fatigue Patient left: in chair;with call bell in reach;with family/visitor present Nurse Communication: Mobility status for transfers;Mobility status for ambulation General Behavior During Session: [facility] for tasks performed Cognition: [facility] for tasks performed  [facility] HELEN 06/21/2011, 1:57 PM

## 2011-06-22 DIAGNOSIS — R5381 Other malaise: Secondary | ICD-10-CM | POA: Diagnosis not present

## 2011-06-22 DIAGNOSIS — I10 Essential (primary) hypertension: Secondary | ICD-10-CM | POA: Diagnosis not present

## 2011-06-22 DIAGNOSIS — R4182 Altered mental status, unspecified: Secondary | ICD-10-CM | POA: Diagnosis not present

## 2011-06-22 DIAGNOSIS — D62 Acute posthemorrhagic anemia: Secondary | ICD-10-CM | POA: Diagnosis not present

## 2011-06-22 LAB — URINE CULTURE

## 2011-06-22 NOTE — Progress Notes (Signed)
Pt transfering to 2028. Report called to Reggie RN.

## 2011-06-22 NOTE — Progress Notes (Signed)
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 CHF, asthma, tripped and fell in a parking lot, and was initially seen at [facility] on 1/24. She was found to have right femur neck fracture . She was medically cleared and underwent right hemiarthroplasty on 1/25. On 1/26, pt developed respiratory distress and went into acute hypoxic respiratory failure, she underwent ct angiogram of the chest which showed bilateral pulmonary emboli, bilateral infiltrates and bilateral pleural effusions. She was transferred to ICU, and was started on anticoagulation. Meanwhile she developed MRSA UTI, acute renal failure, and she went into SVT, with troponin leak. Her respiratory status did not improve and she was maintained on venti mask till 1/29.  Her anticoagulation was stopped, thinking she might have fat embolism in view of her femur fracture and hemiarthroplasty. We have resumed the Heparin.   Subjective: Looks much better today. He is up in chair watching TV eating breakfast and talking animatedly with her daughter. No complaints were verbalized.  Objective: Weight change: -1.94 kg (-4 lb 4.4 oz)  Intake/Output Summary (Last 24 hours) at 06/22/11 1617 Last data filed at 06/22/11 1257  Gross per 24 hour  Intake    240 ml  Output    750 ml  Net   -510 ml   Blood pressure 121/48, pulse 64, temperature 97.4 F (36.3 C), temperature source Oral, resp. rate 15, height 5\' 5"  (1.651 m), weight 80.2 kg (176 lb 12.9 oz), SpO2 95.00%.  Physical Exam: General: No acute respiratory distress but appears very deconditioned, family at bedside Lungs: Clear to auscultation bilaterally with exception to bibasilar crackles - no wheeze, on room air Cardiovascular: regular rate and rhythm without murmur gallop or rub normal  Abdomen: Nontender, nondistended, soft,  bowel sounds positive, no rebound, no ascites, no appreciable mass Extremities: No significant cyanosis or clubbing, R lateral hip wound dressed and dry, no edema in lower legs/ankles. Neuro:  Alert, sleepy, moves all 4 ext, CNII-XII intact B  Lab Results:  Basename 06/21/11 0500 06/20/11 1000  NA 134* 135  K 4.4 4.8  CL 100 102  CO2 29 28  GLUCOSE 115* 135*  BUN 10 11  CREATININE 0.75 0.70  CALCIUM 9.0 9.0  MG -- --  PHOS -- --     Basename 06/21/11 0500  WBC 15.2*  NEUTROABS --  HGB 8.8*  HCT 26.8*  MCV 83.5  PLT 282    Micro Results: Recent Results (from the past 240 hour(s))  MRSA PCR SCREENING     Status: Abnormal   Collection Time   06/14/11  8:20 PM      Component Value Range Status Comment   MRSA by PCR POSITIVE (*) NEGATIVE  Final   CULTURE, BLOOD (ROUTINE X 2)     Status: Normal   Collection Time   06/15/11 12:00 AM      Component Value Range Status Comment   Specimen Description BLOOD RIGHT HAND   Final    Special Requests BOTTLES DRAWN AEROBIC AND ANAEROBIC 10CC   Final    Culture  Setup Time [id number]   Final    Culture NO GROWTH 5 DAYS   Final    Report Status 06/21/2011 FINAL   Final   CULTURE, BLOOD (ROUTINE X 2)     Status: Normal   Collection Time   06/15/11 12:11 AM  Component Value Range Status Comment   Specimen Description BLOOD RIGHT ARM   Final    Special Requests BOTTLES DRAWN AEROBIC AND ANAEROBIC 10CC   Final    Culture  Setup Time [id number]   Final    Culture NO GROWTH 5 DAYS   Final    Report Status 06/21/2011 FINAL   Final   URINE CULTURE     Status: Normal   Collection Time   06/15/11  3:51 PM      Component Value Range Status Comment   Specimen Description URINE, CATHETERIZED   Final    Special Requests NONE   Final    Culture  Setup Time [id number]   Final    Colony Count NO GROWTH   Final    Culture NO GROWTH   Final    Report Status 06/16/2011 FINAL   Final   URINE CULTURE     Status: Normal   Collection Time     06/21/11 10:00 AM      Component Value Range Status Comment   Specimen Description URINE, CATHETERIZED   Final    Special Requests NONE   Final    Culture  Setup Time [id number]   Final    Colony Count NO GROWTH   Final    Culture NO GROWTH   Final    Report Status 06/22/2011 FINAL   Final     Studies/Results: All recent x-ray/radiology reports have been reviewed in detail.   Medications: I have reviewed the patient's complete medication list.  Assessment/Plan:  Pulmonary embolism Had PE in 2006. PCCM suspects due to rapid onset of symptoms likely had PE prior to admit and this possibly precipitated her fall. They recommend lifelong Coumadin Appreciate PCCM eval. We can continue to treat this as a PE. Bleeding into the right thigh has stopped and was switched to Xarelto 06/18/2011.  Dopplers of LE were ordered as advised by PCCM shows no DVT.  pulm status is stable- no longer requiring O2 Mod pulm HTN without RV failure noted on ECHO.  Anemia - due to acute blood loss into right thigh? Transfused 2 more units for a total of 4 units- hgb has been stable since transfusion.  Thigh circumference continues to go down.   R femoral neck fx s/p fall S/p surgical correction at [facility] - partial weight bearing per Orthopedist at [facility] - will consult local Ortho MD if any complications are encountered during rehab process. Will dc foley and encourage mobilization.  Altered mental status Likely multifactorial, with contributions from sedating meds, TME related to UTI, severe B12 deficiency, and sundowning - per her daughter, she appears to be at her baseline now.   Acute hypoxic resp failure  resp status appears to be stabilizing at present - f/u CXR suggests no evidence of a focal infiltrate - I do not feel that abx for pna are indicated   ?Pneumonia - Healthcare Acquired (not at [facility] on admit) As discussed above, CXR and exam are not consistent with an active  pulmonary infection -antibiotics for PNA discontinued.  Acute renal failure Has resolved - no acute findings on renal US - crt peaked at ~1.97 - likely was secondary to acute blood loss and UTI.  UTI- MRSA and enterococcus faecalis which is sensitive to Vanc (I have called and obtain final result from [facility])  Will cont vanc for 7 days - started on 1/29- repeat UA/cx are negative. Today is the last day.  Has progressive leukocytosis  and likely due to indwelling Foley catheter therefore we'll remove catheter today and repeat urinalysis and culture.  Acute blood loss anemia (surgery) + probable chronic anemia  Hemoglobin remained stable after initiation of Xarelto  SVT/elevated troponin Likely due to stress of surgery, acute PEs and blood loss  Severe B12 deficiency - daily B12 SQ for 7 day load then once a week for 4 weeks then Q month  H/o hemorrhagic CVA with right sided weakness and speech abnormalities.   Hypokalemia- resolved with replacement- will check BMET and hold current dose of scheduled KCL- likley can dc/  Full Code  Disposition Transfer to telemetry medical floor. Hopefully we will be able to transfer her to a skilled rehab facility near [place] later this week. They are requesting [facility]. Papers signed Will check Hgb in AM   Marlin Canary, D.O [facility]

## 2011-06-22 NOTE — Plan of Care (Signed)
Problem: Phase III Progression Outcomes Goal: Ambulate BID with assist as able Outcome: Progressing Pivot with maximum assist with staff.

## 2011-06-22 NOTE — Progress Notes (Signed)
Utilization review completed.  

## 2011-06-22 NOTE — Progress Notes (Signed)
Clinical social worker left message with admissions coordinator at [facility], pt will need a 30 day note for pasarr submission. Pt was transferred to hospitalist team 1 service, this csw informed appropriate csw.   Michele Summers, Theresia Majors  [phone] .06/22/2011 8:25am

## 2011-06-23 DIAGNOSIS — Z96649 Presence of unspecified artificial hip joint: Secondary | ICD-10-CM | POA: Diagnosis not present

## 2011-06-23 DIAGNOSIS — N390 Urinary tract infection, site not specified: Secondary | ICD-10-CM | POA: Diagnosis not present

## 2011-06-23 DIAGNOSIS — Z471 Aftercare following joint replacement surgery: Secondary | ICD-10-CM | POA: Diagnosis not present

## 2011-06-23 DIAGNOSIS — M25559 Pain in unspecified hip: Secondary | ICD-10-CM | POA: Diagnosis not present

## 2011-06-23 DIAGNOSIS — I2699 Other pulmonary embolism without acute cor pulmonale: Secondary | ICD-10-CM | POA: Diagnosis not present

## 2011-06-23 DIAGNOSIS — S72019A Unspecified intracapsular fracture of unspecified femur, initial encounter for closed fracture: Secondary | ICD-10-CM | POA: Diagnosis not present

## 2011-06-23 DIAGNOSIS — Z96698 Presence of other orthopedic joint implants: Secondary | ICD-10-CM | POA: Diagnosis not present

## 2011-06-23 DIAGNOSIS — S72009A Fracture of unspecified part of neck of unspecified femur, initial encounter for closed fracture: Secondary | ICD-10-CM | POA: Diagnosis not present

## 2011-06-23 DIAGNOSIS — I10 Essential (primary) hypertension: Secondary | ICD-10-CM | POA: Diagnosis not present

## 2011-06-23 DIAGNOSIS — J189 Pneumonia, unspecified organism: Secondary | ICD-10-CM | POA: Diagnosis not present

## 2011-06-23 DIAGNOSIS — D62 Acute posthemorrhagic anemia: Secondary | ICD-10-CM | POA: Diagnosis not present

## 2011-06-23 DIAGNOSIS — R4182 Altered mental status, unspecified: Secondary | ICD-10-CM | POA: Diagnosis not present

## 2011-06-23 DIAGNOSIS — Z5189 Encounter for other specified aftercare: Secondary | ICD-10-CM | POA: Diagnosis not present

## 2011-06-23 DIAGNOSIS — Z9181 History of falling: Secondary | ICD-10-CM | POA: Diagnosis not present

## 2011-06-23 DIAGNOSIS — I509 Heart failure, unspecified: Secondary | ICD-10-CM | POA: Diagnosis not present

## 2011-06-23 DIAGNOSIS — R5381 Other malaise: Secondary | ICD-10-CM | POA: Diagnosis not present

## 2011-06-23 LAB — BASIC METABOLIC PANEL
Calcium: 9.3 mg/dL (ref 8.4–10.5)
Chloride: 101 mEq/L (ref 96–112)
Creatinine, Ser: 0.79 mg/dL (ref 0.50–1.10)
GFR calc Af Amer: >90 mL/min (ref >90)
Sodium: 135 mEq/L (ref 135–145)

## 2011-06-23 LAB — CBC
MCV: 85.4 fL (ref 78.0–100.0)
Platelets: 306 10*3/uL (ref 150–400)
RDW: 16 % — ABNORMAL HIGH (ref 11.5–15.5)
WBC: 10.1 10*3/uL (ref 4.0–10.5)

## 2011-06-23 MED ORDER — OXYCODONE HCL 5 MG PO TABS: 5.0000 mg | ORAL_TABLET | ORAL | Status: AC | PRN

## 2011-06-23 MED ORDER — BISACODYL 10 MG RE SUPP: 10.0000 mg | Freq: Every day | RECTAL | Status: AC | PRN

## 2011-06-23 MED ORDER — POLYETHYLENE GLYCOL 3350 17 G PO PACK: 17.0000 g | PACK | Freq: Two times a day (BID) | ORAL | Status: AC

## 2011-06-23 MED ORDER — RIVAROXABAN 15 MG PO TABS: 15.0000 mg | ORAL_TABLET | Freq: Two times a day (BID) | ORAL | Status: DC

## 2011-06-23 MED ORDER — SENNA 8.6 MG PO TABS: 1.0000 | ORAL_TABLET | Freq: Two times a day (BID) | ORAL | Status: DC

## 2011-06-23 MED ORDER — RIVAROXABAN 20 MG PO TABS: 20.0000 mg | ORAL_TABLET | Freq: Every day | ORAL | Status: DC

## 2011-06-23 NOTE — Discharge Summary (Signed)
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 CHF, asthma, tripped and fell in a parking lot, and was initially seen at [facility] on 1/24. She was found to have right femur neck fracture . She was medically cleared and underwent right hemiarthroplasty on 1/25. On 1/26, pt developed respiratory distress and went into acute hypoxic respiratory failure, she underwent ct angiogram of the chest which showed bilateral pulmonary emboli, bilateral infiltrates and bilateral pleural effusions. She was transferred to ICU, and was started on anticoagulation. Meanwhile she developed MRSA UTI, acute renal failure, and she went into SVT, with troponin leak. Her respiratory status did not improve and she was maintained on venti mask till 1/29. Her anticoagulation was stopped, thinking she might have fat embolism in view of her femur fracture and hemiarthroplasty. We have resumed the Heparin.  Family here- Expands Hx- Patient had neurosurgery Intervention at [facility] around 2006 with CVA. She had PE complicating that time and was on Warfarin for about a year. Neurologic sequelae resolved and she has been active with ADLs, errands, etc   Discharge Medications Medication List  As of 06/23/2011 10:23 AM   STOP taking these medications         ciprofloxacin 500 MG tablet      HYDROcodone-acetaminophen 5-500  MG per tablet      irbesartan-hydrochlorothiazide 300-12.5 MG per tablet      phenazopyridine 200 MG tablet      SANCTURA XR 60 MG Cp24      zolpidem 10 MG tablet         TAKE these medications         albuterol 108 (90 BASE) MCG/ACT inhaler   Commonly known as: PROVENTIL HFA;VENTOLIN HFA   Inhale 2 puffs into the lungs every 6 (six) hours as needed. For shortness of breath      amLODipine 2.5 MG tablet   Commonly known as: NORVASC   Take 2.5 mg by mouth daily.      atorvastatin 10 MG tablet   Commonly known as: LIPITOR   Take 10 mg by mouth daily.      bisacodyl 10 MG suppository   Commonly known as: DULCOLAX   Place 1 suppository (10 mg total) rectally daily as needed.      FLUoxetine 20 MG capsule   Commonly known as: PROZAC   Take 20 mg by mouth daily.      montelukast 10 MG tablet   Commonly known as: SINGULAIR   Take 10 mg by mouth at bedtime.      oxyCODONE 5 MG immediate release tablet   Commonly known as: Oxy IR/ROXICODONE   Take 1-2 tablets (5-10 mg total) by mouth every 4 (four) hours as needed.      polyethylene glycol packet   Commonly known as: MIRALAX / GLYCOLAX   Take 17 g by mouth 2 (two) times daily.      Rivaroxaban 20 MG Tabs   Take 20  mg by mouth daily.      Rivaroxaban 15 MG Tabs tablet   Commonly known as: XARELTO   Take 1 tablet (15 mg total) by mouth 2 (two) times daily.      senna 8.6 MG Tabs   Commonly known as: SENOKOT   Take 1 tablet (8.6 mg total) by mouth 2 (two) times daily.            Hospital Course: Pulmonary embolism:Had PE in 2006. PCCM suspects due to rapid onset of symptoms likely had PE prior to admit and this possibly precipitated her fall. They recommend lifelong Coumadin  Appreciate PCCM eval. We can continue to treat this as a PE. Bleeding into the right thigh has stopped and was switched to Xarelto 06/18/2011- 15 mg BID until 2/24 and then 20 mg daily  Dopplers of LE were ordered as advised by PCCM shows no  DVT.  pulm status is stable- no longer requiring O2  Mod pulm HTN without RV failure noted on ECHO.  Present on Admission:  .Altered mental status- resolved .Respiratory failure with hypoxia- resolved once treated PE .Pneumonia- did not appear to be active .Acute renal failure- resolved .Femur fracture, right- needs to follow up with physician who did surgery .UTI (lower urinary tract infection)- treated (MRSA and enterococcus faecalis)   Day of Discharge BP 140/58  Pulse 66  Temp(Src) 97.7 F (36.5 C) (Oral)  Resp 20  Ht 5\' 5"  (1.651 m)  Wt 80.2 kg (176 lb 12.9 oz)  BMI 29.42 kg/m2  SpO2 95%  Results for orders placed during the hospital encounter of 06/14/11 (from the past 48 hour(s))  CBC     Status: Abnormal   Collection Time   06/23/11  5:50 AM      Component Value Range Comment   WBC 10.1  4.0 - 10.5 (K/uL)    RBC 3.23 (*) 3.87 - 5.11 (MIL/uL)    Hemoglobin 9.1 (*) 12.0 - 15.0 (g/dL)    HCT 16.1 (*) 09.6 - 46.0 (%)    MCV 85.4  78.0 - 100.0 (fL)    MCH 28.2  26.0 - 34.0 (pg)    MCHC 33.0  30.0 - 36.0 (g/dL)    RDW 04.5 (*) 40.9 - 15.5 (%)    Platelets 306  150 - 400 (K/uL)   BASIC METABOLIC PANEL     Status: Abnormal   Collection Time   06/23/11  5:50 AM      Component Value Range Comment   Sodium 135  135 - 145 (mEq/L)    Potassium 4.0  3.5 - 5.1 (mEq/L)    Chloride 101  96 - 112 (mEq/L)    CO2 28  19 - 32 (mEq/L)    Glucose, Bld 101 (*) 70 - 99 (mg/dL)    BUN 9  6 - 23 (mg/dL)    Creatinine, Ser 8.11  0.50 - 1.10 (mg/dL)    Calcium 9.3  8.4 - 10.5 (mg/dL)    GFR calc non Af Amer 78 (*) >90 (mL/min)    GFR calc Af Amer >90  >90 (mL/min)     Dg Chest 1 View  06/15/2011  *RADIOLOGY REPORT*  Clinical Data: Evaluate for pneumonia  CHEST - 1 VIEW  Comparison: 03/03/2005  Findings: The heart size is moderately enlarged.  There is no pleural effusion or pulmonary edema.  No airspace consolidation identified.  Opacity within the medial right heart border is  identified corresponding to prominent epicardial fat.  IMPRESSION:  1.  Cardiac enlargement. 2.  Mild heart failure.  Original Report Authenticated By: Rosealee Albee, M.D.   Ct Head Wo Contrast  06/15/2011  *RADIOLOGY REPORT*  Clinical Data: Change in mental status.  History of prior stroke.  CT HEAD WITHOUT CONTRAST  Technique:  Contiguous axial images were obtained from the base of the skull through the vertex without contrast.  Comparison: None.  Findings: Technically limited study due to motion artifact.  Focal encephalomalacia in the left posterior frontal region consistent with old infarct.  Diffuse cerebral atrophy.  Low attenuation change in the deep white matter consistent with small vessel ischemia.  No mass effect or midline shift.  No abnormal extra- axial fluid collections.  Ventricles are not dilated.  No effacement of basal cisterns.  No evidence of acute intracranial hemorrhage.  Postoperative changes with right temporal craniotomy. No acute depressed skull fractures. Visualized paranasal sinuses are not opacified.  Vascular calcifications.  IMPRESSION: Diffuse atrophy and small vessel ischemic change.  Old left frontal infarct.  No evidence of acute intracranial hemorrhage, mass lesion, or acute infarct.  Postoperative changes in the right temporal region.  Original Report Authenticated By: Marlon Pel, M.D.   US Renal  06/15/2011  *RADIOLOGY REPORT*  Clinical Data: 78 year old female with urinary tract infection, acute renal failure.  RENAL/URINARY TRACT ULTRASOUND COMPLETE  Comparison:  CTA chest 03/03/2005.  Findings:  Right Kidney:  No hydronephrosis.  Renal length 10.1 cm.  Cortical echotexture within normal limits.  Left Kidney:  No hydronephrosis; stable chronic prominence of the calyces.  Renal length 9.9 cm.  Lobulated appearance of the kidney likely corresponding to areas of chronic renal scarring or atrophy is demonstrated on the comparison. Cortical echotexture within  normal limits.  No focal renal lesion.  Bladder:  Not visualized, Foley catheter reportedly in place.  IMPRESSION: No acute renal findings.  Chronic left renal lobulation/scarring.  Original Report Authenticated By: Harley Hallmark, M.D.     Disposition: SNF  Diet: heart healthy  Activity: increase as tolerated and directed by orthopedics   Follow-up Appts: With Orthopedic Surgeon in 2 weeks and PCP once released from rehab  Discharge Orders    Future Orders Please Complete By Expires   Diet - low sodium heart healthy      Increase activity slowly      No wound care          Time spent on discharge, talking to the patient, and coordinating care: 43 mins.   SignedMarlin Canary, DO 06/23/2011, 10:23 AM

## 2011-06-23 NOTE — Progress Notes (Signed)
Pt to D/C today to [facility], SNF.  CSW spoke with facility, and Pt and her daughters.  All are in agreement with D/C plan.  Pt to be transported by EMS.  CSW will sign off at this time.

## 2011-06-23 NOTE — Progress Notes (Signed)
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 CHF, asthma, tripped and fell in a parking lot, and was initially seen at [facility] on 1/24. She was found to have right femur neck fracture . She was medically cleared and underwent right hemiarthroplasty on 1/25. On 1/26, pt developed respiratory distress and went into acute hypoxic respiratory failure, she underwent ct angiogram of the chest which showed bilateral pulmonary emboli, bilateral infiltrates and bilateral pleural effusions. She was transferred to ICU, and was started on anticoagulation. Meanwhile she developed MRSA UTI, acute renal failure, and she went into SVT, with troponin leak. Her respiratory status did not improve and she was maintained on venti mask till 1/29.  Her anticoagulation was stopped, thinking she might have fat embolism in view of her femur fracture and hemiarthroplasty. We have resumed the Heparin.   Subjective: Looks much better today. He is up in chair watching TV eating breakfast and talking animatedly with her daughter. No complaints were verbalized.  Has dome back pain from laying in bed  Objective: Weight change:   Intake/Output Summary (Last 24 hours) at 06/23/11 0957 Last data filed at 06/22/11 1722  Gross per 24 hour  Intake      0 ml  Output    650 ml  Net   -650 ml   Blood pressure 140/58, pulse 66, temperature 97.7 F (36.5 C), temperature source Oral, resp. rate 20, height 5\' 5"  (1.651 m), weight 80.2 kg (176 lb 12.9 oz), SpO2 95.00%.  Physical Exam: General: No acute respiratory distress but appears very deconditioned, family at bedside Lungs: Clear to auscultation bilaterally with exception to bibasilar crackles - no wheeze, on room air Cardiovascular: regular rate and rhythm without murmur gallop or rub normal  Abdomen: Nontender,  nondistended, soft, bowel sounds positive, no rebound, no ascites, no appreciable mass Extremities: No significant cyanosis or clubbing, R lateral hip wound dressed and dry, no edema in lower legs/ankles. Neuro:  Alert, moves all 4 ext, CNII-XII intact B  Lab Results:  Basename 06/23/11 0550 06/21/11 0500 06/20/11 1000  NA 135 134* 135  K 4.0 4.4 4.8  CL 101 100 102  CO2 28 29 28   GLUCOSE 101* 115* 135*  BUN 9 10 11   CREATININE 0.79 0.75 0.70  CALCIUM 9.3 9.0 9.0  MG -- -- --  PHOS -- -- --     Basename 06/23/11 0550 06/21/11 0500  WBC 10.1 15.2*  NEUTROABS -- --  HGB 9.1* 8.8*  HCT 27.6* 26.8*  MCV 85.4 83.5  PLT 306 282    Micro Results: Recent Results (from the past 240 hour(s))  MRSA PCR SCREENING     Status: Abnormal   Collection Time   06/14/11  8:20 PM      Component Value Range Status Comment   MRSA by PCR POSITIVE (*) NEGATIVE  Final   CULTURE, BLOOD (ROUTINE X 2)     Status: Normal   Collection Time   06/15/11 12:00 AM      Component Value Range Status Comment   Specimen Description BLOOD RIGHT HAND   Final    Special Requests BOTTLES DRAWN AEROBIC AND ANAEROBIC 10CC   Final    Culture  Setup Time [id number]   Final    Culture NO GROWTH 5 DAYS   Final    Report Status 06/21/2011 FINAL   Final  CULTURE, BLOOD (ROUTINE X 2)     Status: Normal   Collection Time   06/15/11 12:11 AM      Component Value Range Status Comment   Specimen Description BLOOD RIGHT ARM   Final    Special Requests BOTTLES DRAWN AEROBIC AND ANAEROBIC 10CC   Final    Culture  Setup Time [id number]   Final    Culture NO GROWTH 5 DAYS   Final    Report Status 06/21/2011 FINAL   Final   URINE CULTURE     Status: Normal   Collection Time   06/15/11  3:51 PM      Component Value Range Status Comment   Specimen Description URINE, CATHETERIZED   Final    Special Requests NONE   Final    Culture  Setup Time [id number]   Final    Colony Count NO GROWTH   Final    Culture NO GROWTH    Final    Report Status 06/16/2011 FINAL   Final   URINE CULTURE     Status: Normal   Collection Time   06/21/11 10:00 AM      Component Value Range Status Comment   Specimen Description URINE, CATHETERIZED   Final    Special Requests NONE   Final    Culture  Setup Time [id number]   Final    Colony Count NO GROWTH   Final    Culture NO GROWTH   Final    Report Status 06/22/2011 FINAL   Final       Medications: I have reviewed the patient's complete medication list.  Assessment/Plan:  Pulmonary embolism Had PE in 2006. PCCM suspects due to rapid onset of symptoms likely had PE prior to admit and this possibly precipitated her fall. They recommend lifelong Coumadin Appreciate PCCM eval. We can continue to treat this as a PE. Bleeding into the right thigh has stopped and was switched to Xarelto 06/18/2011 and will continue this.  Dopplers of LE were ordered as advised by PCCM shows no DVT.  pulm status is stable- no longer requiring O2 Mod pulm HTN without RV failure noted on ECHO.  Anemia - due to acute blood loss into right thigh? Transfused 2 more units for a total of 4 units- hgb has been stable since transfusion.  Thigh circumference continues to go down.  HGB stable   R femoral neck fx s/p fall S/p surgical correction at [facility] - partial weight bearing per Orthopedist at [facility]. Patient working with physical therapy will need to advance activity  Altered mental status At baseline per daughter   Acute hypoxic resp failure  resp status appears to be stabilizing at present - f/u CXR suggests no evidence of a focal infiltrate - I do not feel that abx for pna are indicated   ?Pneumonia - Healthcare Acquired (not at [facility] on admit) As discussed above, CXR and exam are not consistent with an active pulmonary infection -antibiotics for PNA discontinued.  Acute renal failure Has resolved - no acute findings on renal US - crt peaked at ~1.97 - likely was  secondary to acute blood loss and UTI.  UTI- MRSA and enterococcus faecalis which is sensitive to Vanc (I have called and obtain final result from [facility])  Will cont vanc for 7 days - started on 1/29- repeat UA/cx are negative. Today is the last day.  Has progressive leukocytosis and likely due to indwelling Foley catheter therefore we'll remove catheter today and  repeat urinalysis and culture.  Acute blood loss anemia (surgery) + probable chronic anemia  Hemoglobin remained stable after initiation of Xarelto  SVT/elevated troponin Likely due to stress of surgery, acute PEs and blood loss  Severe B12 deficiency - daily B12 SQ for 7 day load then once a week for 4 weeks then Q month  H/o hemorrhagic CVA with right sided weakness and speech abnormalities.   Hypokalemia- resolved with replacement- will check BMET and hold current dose of scheduled KCL- likley can dc/  Full Code  Disposition Transfer to telemetry medical floor. Hopefully we will be able to transfer her to a skilled rehab facility today. They are requesting [facility]. Papers signed await bed.   Marlin Canary, D.O [facility]

## 2011-06-28 DIAGNOSIS — Z96698 Presence of other orthopedic joint implants: Secondary | ICD-10-CM | POA: Diagnosis not present

## 2011-06-28 DIAGNOSIS — S72019A Unspecified intracapsular fracture of unspecified femur, initial encounter for closed fracture: Secondary | ICD-10-CM | POA: Diagnosis not present

## 2011-06-28 DIAGNOSIS — Z5189 Encounter for other specified aftercare: Secondary | ICD-10-CM | POA: Diagnosis not present

## 2011-06-28 DIAGNOSIS — I2699 Other pulmonary embolism without acute cor pulmonale: Secondary | ICD-10-CM | POA: Diagnosis not present

## 2011-06-28 DIAGNOSIS — I509 Heart failure, unspecified: Secondary | ICD-10-CM | POA: Diagnosis not present

## 2011-07-14 DIAGNOSIS — R262 Difficulty in walking, not elsewhere classified: Secondary | ICD-10-CM | POA: Diagnosis not present

## 2011-07-14 DIAGNOSIS — W010XXA Fall on same level from slipping, tripping and stumbling without subsequent striking against object, initial encounter: Secondary | ICD-10-CM | POA: Diagnosis not present

## 2011-07-14 DIAGNOSIS — Z471 Aftercare following joint replacement surgery: Secondary | ICD-10-CM | POA: Diagnosis not present

## 2011-07-14 DIAGNOSIS — I509 Heart failure, unspecified: Secondary | ICD-10-CM | POA: Diagnosis not present

## 2011-07-14 DIAGNOSIS — Y9289 Other specified places as the place of occurrence of the external cause: Secondary | ICD-10-CM | POA: Diagnosis not present

## 2011-07-14 DIAGNOSIS — IMO0001 Reserved for inherently not codable concepts without codable children: Secondary | ICD-10-CM | POA: Diagnosis not present

## 2011-07-14 DIAGNOSIS — Z96649 Presence of unspecified artificial hip joint: Secondary | ICD-10-CM | POA: Diagnosis not present

## 2011-07-14 DIAGNOSIS — I2699 Other pulmonary embolism without acute cor pulmonale: Secondary | ICD-10-CM | POA: Diagnosis not present

## 2011-07-14 DIAGNOSIS — J45909 Unspecified asthma, uncomplicated: Secondary | ICD-10-CM | POA: Diagnosis not present

## 2011-07-14 DIAGNOSIS — I10 Essential (primary) hypertension: Secondary | ICD-10-CM | POA: Diagnosis not present

## 2011-07-18 DIAGNOSIS — R262 Difficulty in walking, not elsewhere classified: Secondary | ICD-10-CM | POA: Diagnosis not present

## 2011-07-18 DIAGNOSIS — I509 Heart failure, unspecified: Secondary | ICD-10-CM | POA: Diagnosis not present

## 2011-07-18 DIAGNOSIS — I2699 Other pulmonary embolism without acute cor pulmonale: Secondary | ICD-10-CM | POA: Diagnosis not present

## 2011-07-18 DIAGNOSIS — IMO0001 Reserved for inherently not codable concepts without codable children: Secondary | ICD-10-CM | POA: Diagnosis not present

## 2011-07-18 DIAGNOSIS — Z471 Aftercare following joint replacement surgery: Secondary | ICD-10-CM | POA: Diagnosis not present

## 2011-07-18 DIAGNOSIS — I10 Essential (primary) hypertension: Secondary | ICD-10-CM | POA: Diagnosis not present

## 2011-07-20 DIAGNOSIS — Z471 Aftercare following joint replacement surgery: Secondary | ICD-10-CM | POA: Diagnosis not present

## 2011-07-20 DIAGNOSIS — I2699 Other pulmonary embolism without acute cor pulmonale: Secondary | ICD-10-CM | POA: Diagnosis not present

## 2011-07-20 DIAGNOSIS — R262 Difficulty in walking, not elsewhere classified: Secondary | ICD-10-CM | POA: Diagnosis not present

## 2011-07-20 DIAGNOSIS — I10 Essential (primary) hypertension: Secondary | ICD-10-CM | POA: Diagnosis not present

## 2011-07-20 DIAGNOSIS — IMO0001 Reserved for inherently not codable concepts without codable children: Secondary | ICD-10-CM | POA: Diagnosis not present

## 2011-07-20 DIAGNOSIS — I509 Heart failure, unspecified: Secondary | ICD-10-CM | POA: Diagnosis not present

## 2011-07-21 DIAGNOSIS — I2699 Other pulmonary embolism without acute cor pulmonale: Secondary | ICD-10-CM | POA: Diagnosis not present

## 2011-07-21 DIAGNOSIS — R262 Difficulty in walking, not elsewhere classified: Secondary | ICD-10-CM | POA: Diagnosis not present

## 2011-07-21 DIAGNOSIS — I10 Essential (primary) hypertension: Secondary | ICD-10-CM | POA: Diagnosis not present

## 2011-07-21 DIAGNOSIS — Z471 Aftercare following joint replacement surgery: Secondary | ICD-10-CM | POA: Diagnosis not present

## 2011-07-21 DIAGNOSIS — I509 Heart failure, unspecified: Secondary | ICD-10-CM | POA: Diagnosis not present

## 2011-07-21 DIAGNOSIS — IMO0001 Reserved for inherently not codable concepts without codable children: Secondary | ICD-10-CM | POA: Diagnosis not present

## 2011-07-25 DIAGNOSIS — I2699 Other pulmonary embolism without acute cor pulmonale: Secondary | ICD-10-CM | POA: Diagnosis not present

## 2011-07-25 DIAGNOSIS — R262 Difficulty in walking, not elsewhere classified: Secondary | ICD-10-CM | POA: Diagnosis not present

## 2011-07-25 DIAGNOSIS — Z471 Aftercare following joint replacement surgery: Secondary | ICD-10-CM | POA: Diagnosis not present

## 2011-07-25 DIAGNOSIS — I509 Heart failure, unspecified: Secondary | ICD-10-CM | POA: Diagnosis not present

## 2011-07-25 DIAGNOSIS — IMO0001 Reserved for inherently not codable concepts without codable children: Secondary | ICD-10-CM | POA: Diagnosis not present

## 2011-07-25 DIAGNOSIS — I10 Essential (primary) hypertension: Secondary | ICD-10-CM | POA: Diagnosis not present

## 2011-07-26 DIAGNOSIS — E782 Mixed hyperlipidemia: Secondary | ICD-10-CM | POA: Diagnosis not present

## 2011-07-26 DIAGNOSIS — M810 Age-related osteoporosis without current pathological fracture: Secondary | ICD-10-CM | POA: Diagnosis not present

## 2011-07-26 DIAGNOSIS — S72019A Unspecified intracapsular fracture of unspecified femur, initial encounter for closed fracture: Secondary | ICD-10-CM | POA: Diagnosis not present

## 2011-07-26 DIAGNOSIS — R5383 Other fatigue: Secondary | ICD-10-CM | POA: Diagnosis not present

## 2011-07-26 DIAGNOSIS — I69928 Other speech and language deficits following unspecified cerebrovascular disease: Secondary | ICD-10-CM | POA: Diagnosis not present

## 2011-07-26 DIAGNOSIS — I10 Essential (primary) hypertension: Secondary | ICD-10-CM | POA: Diagnosis not present

## 2011-07-26 DIAGNOSIS — G4700 Insomnia, unspecified: Secondary | ICD-10-CM | POA: Diagnosis not present

## 2011-07-26 DIAGNOSIS — R4789 Other speech disturbances: Secondary | ICD-10-CM | POA: Diagnosis not present

## 2011-07-26 DIAGNOSIS — R5381 Other malaise: Secondary | ICD-10-CM | POA: Diagnosis not present

## 2011-07-28 DIAGNOSIS — Z471 Aftercare following joint replacement surgery: Secondary | ICD-10-CM | POA: Diagnosis not present

## 2011-07-28 DIAGNOSIS — IMO0001 Reserved for inherently not codable concepts without codable children: Secondary | ICD-10-CM | POA: Diagnosis not present

## 2011-07-28 DIAGNOSIS — I2699 Other pulmonary embolism without acute cor pulmonale: Secondary | ICD-10-CM | POA: Diagnosis not present

## 2011-07-28 DIAGNOSIS — I509 Heart failure, unspecified: Secondary | ICD-10-CM | POA: Diagnosis not present

## 2011-07-28 DIAGNOSIS — I10 Essential (primary) hypertension: Secondary | ICD-10-CM | POA: Diagnosis not present

## 2011-07-28 DIAGNOSIS — R262 Difficulty in walking, not elsewhere classified: Secondary | ICD-10-CM | POA: Diagnosis not present

## 2011-07-29 DIAGNOSIS — R262 Difficulty in walking, not elsewhere classified: Secondary | ICD-10-CM | POA: Diagnosis not present

## 2011-07-29 DIAGNOSIS — I509 Heart failure, unspecified: Secondary | ICD-10-CM | POA: Diagnosis not present

## 2011-07-29 DIAGNOSIS — I10 Essential (primary) hypertension: Secondary | ICD-10-CM | POA: Diagnosis not present

## 2011-07-29 DIAGNOSIS — I2699 Other pulmonary embolism without acute cor pulmonale: Secondary | ICD-10-CM | POA: Diagnosis not present

## 2011-07-29 DIAGNOSIS — Z471 Aftercare following joint replacement surgery: Secondary | ICD-10-CM | POA: Diagnosis not present

## 2011-07-29 DIAGNOSIS — IMO0001 Reserved for inherently not codable concepts without codable children: Secondary | ICD-10-CM | POA: Diagnosis not present

## 2011-08-03 DIAGNOSIS — IMO0001 Reserved for inherently not codable concepts without codable children: Secondary | ICD-10-CM | POA: Diagnosis not present

## 2011-08-03 DIAGNOSIS — R262 Difficulty in walking, not elsewhere classified: Secondary | ICD-10-CM | POA: Diagnosis not present

## 2011-08-03 DIAGNOSIS — I2699 Other pulmonary embolism without acute cor pulmonale: Secondary | ICD-10-CM | POA: Diagnosis not present

## 2011-08-03 DIAGNOSIS — Z471 Aftercare following joint replacement surgery: Secondary | ICD-10-CM | POA: Diagnosis not present

## 2011-08-03 DIAGNOSIS — I10 Essential (primary) hypertension: Secondary | ICD-10-CM | POA: Diagnosis not present

## 2011-08-03 DIAGNOSIS — I509 Heart failure, unspecified: Secondary | ICD-10-CM | POA: Diagnosis not present

## 2011-08-04 DIAGNOSIS — I509 Heart failure, unspecified: Secondary | ICD-10-CM | POA: Diagnosis not present

## 2011-08-04 DIAGNOSIS — I2699 Other pulmonary embolism without acute cor pulmonale: Secondary | ICD-10-CM | POA: Diagnosis not present

## 2011-08-04 DIAGNOSIS — Z471 Aftercare following joint replacement surgery: Secondary | ICD-10-CM | POA: Diagnosis not present

## 2011-08-04 DIAGNOSIS — R262 Difficulty in walking, not elsewhere classified: Secondary | ICD-10-CM | POA: Diagnosis not present

## 2011-08-04 DIAGNOSIS — IMO0001 Reserved for inherently not codable concepts without codable children: Secondary | ICD-10-CM | POA: Diagnosis not present

## 2011-08-04 DIAGNOSIS — I10 Essential (primary) hypertension: Secondary | ICD-10-CM | POA: Diagnosis not present

## 2011-08-05 DIAGNOSIS — I10 Essential (primary) hypertension: Secondary | ICD-10-CM | POA: Diagnosis not present

## 2011-08-05 DIAGNOSIS — I2699 Other pulmonary embolism without acute cor pulmonale: Secondary | ICD-10-CM | POA: Diagnosis not present

## 2011-08-05 DIAGNOSIS — I509 Heart failure, unspecified: Secondary | ICD-10-CM | POA: Diagnosis not present

## 2011-08-05 DIAGNOSIS — Z471 Aftercare following joint replacement surgery: Secondary | ICD-10-CM | POA: Diagnosis not present

## 2011-08-05 DIAGNOSIS — IMO0001 Reserved for inherently not codable concepts without codable children: Secondary | ICD-10-CM | POA: Diagnosis not present

## 2011-08-05 DIAGNOSIS — R262 Difficulty in walking, not elsewhere classified: Secondary | ICD-10-CM | POA: Diagnosis not present

## 2011-08-08 DIAGNOSIS — I509 Heart failure, unspecified: Secondary | ICD-10-CM | POA: Diagnosis not present

## 2011-08-08 DIAGNOSIS — Z471 Aftercare following joint replacement surgery: Secondary | ICD-10-CM | POA: Diagnosis not present

## 2011-08-08 DIAGNOSIS — IMO0001 Reserved for inherently not codable concepts without codable children: Secondary | ICD-10-CM | POA: Diagnosis not present

## 2011-08-08 DIAGNOSIS — R262 Difficulty in walking, not elsewhere classified: Secondary | ICD-10-CM | POA: Diagnosis not present

## 2011-08-08 DIAGNOSIS — I10 Essential (primary) hypertension: Secondary | ICD-10-CM | POA: Diagnosis not present

## 2011-08-08 DIAGNOSIS — I2699 Other pulmonary embolism without acute cor pulmonale: Secondary | ICD-10-CM | POA: Diagnosis not present

## 2011-08-10 DIAGNOSIS — I10 Essential (primary) hypertension: Secondary | ICD-10-CM | POA: Diagnosis not present

## 2011-08-10 DIAGNOSIS — I2699 Other pulmonary embolism without acute cor pulmonale: Secondary | ICD-10-CM | POA: Diagnosis not present

## 2011-08-10 DIAGNOSIS — R262 Difficulty in walking, not elsewhere classified: Secondary | ICD-10-CM | POA: Diagnosis not present

## 2011-08-10 DIAGNOSIS — Z471 Aftercare following joint replacement surgery: Secondary | ICD-10-CM | POA: Diagnosis not present

## 2011-08-10 DIAGNOSIS — I509 Heart failure, unspecified: Secondary | ICD-10-CM | POA: Diagnosis not present

## 2011-08-10 DIAGNOSIS — IMO0001 Reserved for inherently not codable concepts without codable children: Secondary | ICD-10-CM | POA: Diagnosis not present

## 2011-08-15 DIAGNOSIS — IMO0001 Reserved for inherently not codable concepts without codable children: Secondary | ICD-10-CM | POA: Diagnosis not present

## 2011-08-15 DIAGNOSIS — I509 Heart failure, unspecified: Secondary | ICD-10-CM | POA: Diagnosis not present

## 2011-08-15 DIAGNOSIS — R262 Difficulty in walking, not elsewhere classified: Secondary | ICD-10-CM | POA: Diagnosis not present

## 2011-08-15 DIAGNOSIS — Z471 Aftercare following joint replacement surgery: Secondary | ICD-10-CM | POA: Diagnosis not present

## 2011-08-15 DIAGNOSIS — I2699 Other pulmonary embolism without acute cor pulmonale: Secondary | ICD-10-CM | POA: Diagnosis not present

## 2011-08-15 DIAGNOSIS — I10 Essential (primary) hypertension: Secondary | ICD-10-CM | POA: Diagnosis not present

## 2011-08-18 DIAGNOSIS — I2699 Other pulmonary embolism without acute cor pulmonale: Secondary | ICD-10-CM | POA: Diagnosis not present

## 2011-08-18 DIAGNOSIS — R262 Difficulty in walking, not elsewhere classified: Secondary | ICD-10-CM | POA: Diagnosis not present

## 2011-08-18 DIAGNOSIS — IMO0001 Reserved for inherently not codable concepts without codable children: Secondary | ICD-10-CM | POA: Diagnosis not present

## 2011-08-18 DIAGNOSIS — I10 Essential (primary) hypertension: Secondary | ICD-10-CM | POA: Diagnosis not present

## 2011-08-18 DIAGNOSIS — Z471 Aftercare following joint replacement surgery: Secondary | ICD-10-CM | POA: Diagnosis not present

## 2011-08-18 DIAGNOSIS — I509 Heart failure, unspecified: Secondary | ICD-10-CM | POA: Diagnosis not present

## 2011-08-23 DIAGNOSIS — IMO0001 Reserved for inherently not codable concepts without codable children: Secondary | ICD-10-CM | POA: Diagnosis not present

## 2011-08-23 DIAGNOSIS — I509 Heart failure, unspecified: Secondary | ICD-10-CM | POA: Diagnosis not present

## 2011-08-23 DIAGNOSIS — I10 Essential (primary) hypertension: Secondary | ICD-10-CM | POA: Diagnosis not present

## 2011-08-23 DIAGNOSIS — I2699 Other pulmonary embolism without acute cor pulmonale: Secondary | ICD-10-CM | POA: Diagnosis not present

## 2011-08-23 DIAGNOSIS — R262 Difficulty in walking, not elsewhere classified: Secondary | ICD-10-CM | POA: Diagnosis not present

## 2011-08-23 DIAGNOSIS — Z471 Aftercare following joint replacement surgery: Secondary | ICD-10-CM | POA: Diagnosis not present

## 2011-08-25 DIAGNOSIS — I2699 Other pulmonary embolism without acute cor pulmonale: Secondary | ICD-10-CM | POA: Diagnosis not present

## 2011-08-25 DIAGNOSIS — R262 Difficulty in walking, not elsewhere classified: Secondary | ICD-10-CM | POA: Diagnosis not present

## 2011-08-25 DIAGNOSIS — IMO0001 Reserved for inherently not codable concepts without codable children: Secondary | ICD-10-CM | POA: Diagnosis not present

## 2011-08-25 DIAGNOSIS — Z471 Aftercare following joint replacement surgery: Secondary | ICD-10-CM | POA: Diagnosis not present

## 2011-08-25 DIAGNOSIS — I10 Essential (primary) hypertension: Secondary | ICD-10-CM | POA: Diagnosis not present

## 2011-08-25 DIAGNOSIS — I509 Heart failure, unspecified: Secondary | ICD-10-CM | POA: Diagnosis not present

## 2011-08-29 DIAGNOSIS — Z79899 Other long term (current) drug therapy: Secondary | ICD-10-CM | POA: Diagnosis not present

## 2011-08-29 DIAGNOSIS — I10 Essential (primary) hypertension: Secondary | ICD-10-CM | POA: Diagnosis not present

## 2011-08-29 DIAGNOSIS — G4700 Insomnia, unspecified: Secondary | ICD-10-CM | POA: Diagnosis not present

## 2011-08-29 DIAGNOSIS — I69928 Other speech and language deficits following unspecified cerebrovascular disease: Secondary | ICD-10-CM | POA: Diagnosis not present

## 2011-08-29 DIAGNOSIS — M810 Age-related osteoporosis without current pathological fracture: Secondary | ICD-10-CM | POA: Diagnosis not present

## 2011-08-29 DIAGNOSIS — E782 Mixed hyperlipidemia: Secondary | ICD-10-CM | POA: Diagnosis not present

## 2011-08-29 DIAGNOSIS — R4789 Other speech disturbances: Secondary | ICD-10-CM | POA: Diagnosis not present

## 2011-08-29 DIAGNOSIS — R5383 Other fatigue: Secondary | ICD-10-CM | POA: Diagnosis not present

## 2011-08-30 DIAGNOSIS — Z471 Aftercare following joint replacement surgery: Secondary | ICD-10-CM | POA: Diagnosis not present

## 2011-08-30 DIAGNOSIS — I10 Essential (primary) hypertension: Secondary | ICD-10-CM | POA: Diagnosis not present

## 2011-08-30 DIAGNOSIS — R262 Difficulty in walking, not elsewhere classified: Secondary | ICD-10-CM | POA: Diagnosis not present

## 2011-08-30 DIAGNOSIS — I2699 Other pulmonary embolism without acute cor pulmonale: Secondary | ICD-10-CM | POA: Diagnosis not present

## 2011-08-30 DIAGNOSIS — I509 Heart failure, unspecified: Secondary | ICD-10-CM | POA: Diagnosis not present

## 2011-08-30 DIAGNOSIS — IMO0001 Reserved for inherently not codable concepts without codable children: Secondary | ICD-10-CM | POA: Diagnosis not present

## 2011-09-01 DIAGNOSIS — I2699 Other pulmonary embolism without acute cor pulmonale: Secondary | ICD-10-CM | POA: Diagnosis not present

## 2011-09-01 DIAGNOSIS — Z471 Aftercare following joint replacement surgery: Secondary | ICD-10-CM | POA: Diagnosis not present

## 2011-09-01 DIAGNOSIS — R262 Difficulty in walking, not elsewhere classified: Secondary | ICD-10-CM | POA: Diagnosis not present

## 2011-09-01 DIAGNOSIS — I10 Essential (primary) hypertension: Secondary | ICD-10-CM | POA: Diagnosis not present

## 2011-09-01 DIAGNOSIS — I509 Heart failure, unspecified: Secondary | ICD-10-CM | POA: Diagnosis not present

## 2011-09-01 DIAGNOSIS — IMO0001 Reserved for inherently not codable concepts without codable children: Secondary | ICD-10-CM | POA: Diagnosis not present

## 2011-09-05 DIAGNOSIS — IMO0001 Reserved for inherently not codable concepts without codable children: Secondary | ICD-10-CM | POA: Diagnosis not present

## 2011-09-05 DIAGNOSIS — I509 Heart failure, unspecified: Secondary | ICD-10-CM | POA: Diagnosis not present

## 2011-09-05 DIAGNOSIS — I10 Essential (primary) hypertension: Secondary | ICD-10-CM | POA: Diagnosis not present

## 2011-09-05 DIAGNOSIS — R262 Difficulty in walking, not elsewhere classified: Secondary | ICD-10-CM | POA: Diagnosis not present

## 2011-09-05 DIAGNOSIS — I2699 Other pulmonary embolism without acute cor pulmonale: Secondary | ICD-10-CM | POA: Diagnosis not present

## 2011-09-05 DIAGNOSIS — Z471 Aftercare following joint replacement surgery: Secondary | ICD-10-CM | POA: Diagnosis not present

## 2011-09-08 DIAGNOSIS — I10 Essential (primary) hypertension: Secondary | ICD-10-CM | POA: Diagnosis not present

## 2011-09-08 DIAGNOSIS — G4700 Insomnia, unspecified: Secondary | ICD-10-CM | POA: Diagnosis not present

## 2011-09-08 DIAGNOSIS — I69928 Other speech and language deficits following unspecified cerebrovascular disease: Secondary | ICD-10-CM | POA: Diagnosis not present

## 2011-09-08 DIAGNOSIS — F411 Generalized anxiety disorder: Secondary | ICD-10-CM | POA: Diagnosis not present

## 2011-09-08 DIAGNOSIS — M79609 Pain in unspecified limb: Secondary | ICD-10-CM | POA: Diagnosis not present

## 2011-09-08 DIAGNOSIS — I2699 Other pulmonary embolism without acute cor pulmonale: Secondary | ICD-10-CM | POA: Diagnosis not present

## 2011-09-15 DIAGNOSIS — I69919 Unspecified symptoms and signs involving cognitive functions following unspecified cerebrovascular disease: Secondary | ICD-10-CM | POA: Diagnosis not present

## 2011-09-15 DIAGNOSIS — J441 Chronic obstructive pulmonary disease with (acute) exacerbation: Secondary | ICD-10-CM | POA: Diagnosis not present

## 2011-09-15 DIAGNOSIS — Z9981 Dependence on supplemental oxygen: Secondary | ICD-10-CM | POA: Diagnosis not present

## 2011-09-15 DIAGNOSIS — Z86718 Personal history of other venous thrombosis and embolism: Secondary | ICD-10-CM | POA: Diagnosis not present

## 2011-09-15 DIAGNOSIS — R262 Difficulty in walking, not elsewhere classified: Secondary | ICD-10-CM | POA: Diagnosis not present

## 2011-09-15 DIAGNOSIS — Z7901 Long term (current) use of anticoagulants: Secondary | ICD-10-CM | POA: Diagnosis not present

## 2011-09-15 DIAGNOSIS — Z8781 Personal history of (healed) traumatic fracture: Secondary | ICD-10-CM | POA: Diagnosis not present

## 2011-09-15 DIAGNOSIS — I69921 Dysphasia following unspecified cerebrovascular disease: Secondary | ICD-10-CM | POA: Diagnosis not present

## 2011-09-15 DIAGNOSIS — I10 Essential (primary) hypertension: Secondary | ICD-10-CM | POA: Diagnosis not present

## 2011-09-15 DIAGNOSIS — Z9181 History of falling: Secondary | ICD-10-CM | POA: Diagnosis not present

## 2011-09-15 DIAGNOSIS — Z86711 Personal history of pulmonary embolism: Secondary | ICD-10-CM | POA: Diagnosis not present

## 2011-09-21 DIAGNOSIS — J441 Chronic obstructive pulmonary disease with (acute) exacerbation: Secondary | ICD-10-CM | POA: Diagnosis not present

## 2011-09-21 DIAGNOSIS — R262 Difficulty in walking, not elsewhere classified: Secondary | ICD-10-CM | POA: Diagnosis not present

## 2011-09-21 DIAGNOSIS — Z86718 Personal history of other venous thrombosis and embolism: Secondary | ICD-10-CM | POA: Diagnosis not present

## 2011-09-21 DIAGNOSIS — I10 Essential (primary) hypertension: Secondary | ICD-10-CM | POA: Diagnosis not present

## 2011-09-21 DIAGNOSIS — I69921 Dysphasia following unspecified cerebrovascular disease: Secondary | ICD-10-CM | POA: Diagnosis not present

## 2011-09-21 DIAGNOSIS — I69919 Unspecified symptoms and signs involving cognitive functions following unspecified cerebrovascular disease: Secondary | ICD-10-CM | POA: Diagnosis not present

## 2011-11-07 ENCOUNTER — Emergency Department: Payer: Self-pay | Admitting: Emergency Medicine

## 2011-11-07 DIAGNOSIS — S72109A Unspecified trochanteric fracture of unspecified femur, initial encounter for closed fracture: Secondary | ICD-10-CM | POA: Diagnosis not present

## 2011-11-07 DIAGNOSIS — R5383 Other fatigue: Secondary | ICD-10-CM | POA: Diagnosis not present

## 2011-11-07 DIAGNOSIS — T85698A Other mechanical complication of other specified internal prosthetic devices, implants and grafts, initial encounter: Secondary | ICD-10-CM | POA: Diagnosis not present

## 2011-11-07 DIAGNOSIS — R6889 Other general symptoms and signs: Secondary | ICD-10-CM | POA: Diagnosis not present

## 2011-11-08 ENCOUNTER — Observation Stay: Payer: Self-pay | Admitting: Internal Medicine

## 2011-11-08 DIAGNOSIS — F411 Generalized anxiety disorder: Secondary | ICD-10-CM | POA: Diagnosis not present

## 2011-11-08 DIAGNOSIS — I10 Essential (primary) hypertension: Secondary | ICD-10-CM | POA: Diagnosis not present

## 2011-11-08 DIAGNOSIS — B9689 Other specified bacterial agents as the cause of diseases classified elsewhere: Secondary | ICD-10-CM | POA: Diagnosis not present

## 2011-11-08 DIAGNOSIS — F329 Major depressive disorder, single episode, unspecified: Secondary | ICD-10-CM | POA: Diagnosis not present

## 2011-11-08 DIAGNOSIS — Z8673 Personal history of transient ischemic attack (TIA), and cerebral infarction without residual deficits: Secondary | ICD-10-CM | POA: Diagnosis not present

## 2011-11-08 DIAGNOSIS — J45909 Unspecified asthma, uncomplicated: Secondary | ICD-10-CM | POA: Diagnosis not present

## 2011-11-08 DIAGNOSIS — E785 Hyperlipidemia, unspecified: Secondary | ICD-10-CM | POA: Diagnosis not present

## 2011-11-08 DIAGNOSIS — S72109A Unspecified trochanteric fracture of unspecified femur, initial encounter for closed fracture: Secondary | ICD-10-CM | POA: Diagnosis not present

## 2011-11-08 DIAGNOSIS — I498 Other specified cardiac arrhythmias: Secondary | ICD-10-CM | POA: Diagnosis not present

## 2011-11-08 DIAGNOSIS — T85698A Other mechanical complication of other specified internal prosthetic devices, implants and grafts, initial encounter: Secondary | ICD-10-CM | POA: Diagnosis not present

## 2011-11-08 DIAGNOSIS — R6889 Other general symptoms and signs: Secondary | ICD-10-CM | POA: Diagnosis not present

## 2011-11-08 DIAGNOSIS — Z79899 Other long term (current) drug therapy: Secondary | ICD-10-CM | POA: Diagnosis not present

## 2011-11-08 DIAGNOSIS — R5383 Other fatigue: Secondary | ICD-10-CM | POA: Diagnosis not present

## 2011-11-08 DIAGNOSIS — N390 Urinary tract infection, site not specified: Secondary | ICD-10-CM | POA: Diagnosis not present

## 2011-11-08 DIAGNOSIS — M25559 Pain in unspecified hip: Secondary | ICD-10-CM | POA: Diagnosis not present

## 2011-11-08 DIAGNOSIS — Z86718 Personal history of other venous thrombosis and embolism: Secondary | ICD-10-CM | POA: Diagnosis not present

## 2011-11-08 LAB — COMPREHENSIVE METABOLIC PANEL
Albumin: 3.3 g/dL — ABNORMAL LOW (ref 3.4–5.0)
Alkaline Phosphatase: 83 U/L (ref 50–136)
BUN: 11 mg/dL (ref 7–18)
Bilirubin,Total: 1.2 mg/dL — ABNORMAL HIGH (ref 0.2–1.0)
Co2: 29 mmol/L (ref 21–32)
Creatinine: 0.85 mg/dL (ref 0.60–1.30)
EGFR (African American): >60
Glucose: 92 mg/dL (ref 65–99)
Osmolality: 280 (ref 275–301)
SGOT(AST): 13 U/L — ABNORMAL LOW (ref 15–37)
SGPT (ALT): 10 U/L — ABNORMAL LOW
Sodium: 141 mmol/L (ref 136–145)
Total Protein: 6.4 g/dL (ref 6.4–8.2)

## 2011-11-08 LAB — URINALYSIS, COMPLETE
Bilirubin,UR: NEGATIVE
Glucose,UR: NEGATIVE mg/dL (ref 0–75)
Nitrite: POSITIVE
Ph: 5 (ref 4.5–8.0)
Protein: 30
RBC,UR: 74 /HPF (ref 0–5)
Squamous Epithelial: NONE SEEN
WBC UR: 468 /HPF (ref 0–5)

## 2011-11-08 LAB — CBC
HGB: 11.3 g/dL — ABNORMAL LOW (ref 12.0–16.0)
MCH: 26.1 pg (ref 26.0–34.0)
MCHC: 32.8 g/dL (ref 32.0–36.0)
MCV: 80 fL (ref 80–100)
RDW: 15.8 % — ABNORMAL HIGH (ref 11.5–14.5)
WBC: 6 10*3/uL (ref 3.6–11.0)

## 2011-11-08 LAB — APTT: Activated PTT: 51.7 secs — ABNORMAL HIGH (ref 23.6–35.9)

## 2011-11-09 DIAGNOSIS — N390 Urinary tract infection, site not specified: Secondary | ICD-10-CM | POA: Diagnosis not present

## 2011-11-09 DIAGNOSIS — S72109A Unspecified trochanteric fracture of unspecified femur, initial encounter for closed fracture: Secondary | ICD-10-CM | POA: Diagnosis not present

## 2011-11-09 DIAGNOSIS — R6889 Other general symptoms and signs: Secondary | ICD-10-CM | POA: Diagnosis not present

## 2011-11-09 DIAGNOSIS — I10 Essential (primary) hypertension: Secondary | ICD-10-CM | POA: Diagnosis not present

## 2011-11-09 DIAGNOSIS — J45909 Unspecified asthma, uncomplicated: Secondary | ICD-10-CM | POA: Diagnosis not present

## 2011-11-09 LAB — CBC WITH DIFFERENTIAL/PLATELET
Basophil #: 0 10*3/uL (ref 0.0–0.1)
Eosinophil %: 6.4 %
HGB: 11.2 g/dL — ABNORMAL LOW (ref 12.0–16.0)
Lymphocyte #: 1.1 10*3/uL (ref 1.0–3.6)
Lymphocyte %: 16.3 %
MCH: 26.2 pg (ref 26.0–34.0)
MCV: 80 fL (ref 80–100)
Neutrophil #: 4.6 10*3/uL (ref 1.4–6.5)
Neutrophil %: 71.4 %
Platelet: 262 10*3/uL (ref 150–440)
RDW: 15.6 % — ABNORMAL HIGH (ref 11.5–14.5)
WBC: 6.4 10*3/uL (ref 3.6–11.0)

## 2011-11-09 LAB — BASIC METABOLIC PANEL
BUN: 11 mg/dL (ref 7–18)
Calcium, Total: 8.6 mg/dL (ref 8.5–10.1)
Chloride: 103 mmol/L (ref 98–107)
Co2: 28 mmol/L (ref 21–32)
Creatinine: 0.84 mg/dL (ref 0.60–1.30)
EGFR (African American): >60
Glucose: 103 mg/dL — ABNORMAL HIGH (ref 65–99)
Potassium: 3.9 mmol/L (ref 3.5–5.1)
Sodium: 138 mmol/L (ref 136–145)

## 2011-11-10 DIAGNOSIS — R262 Difficulty in walking, not elsewhere classified: Secondary | ICD-10-CM | POA: Diagnosis not present

## 2011-11-10 DIAGNOSIS — I10 Essential (primary) hypertension: Secondary | ICD-10-CM | POA: Diagnosis not present

## 2011-11-10 DIAGNOSIS — Z86711 Personal history of pulmonary embolism: Secondary | ICD-10-CM | POA: Diagnosis not present

## 2011-11-10 DIAGNOSIS — G8911 Acute pain due to trauma: Secondary | ICD-10-CM | POA: Diagnosis not present

## 2011-11-10 DIAGNOSIS — S72009D Fracture of unspecified part of neck of unspecified femur, subsequent encounter for closed fracture with routine healing: Secondary | ICD-10-CM | POA: Diagnosis not present

## 2011-11-10 DIAGNOSIS — J45909 Unspecified asthma, uncomplicated: Secondary | ICD-10-CM | POA: Diagnosis not present

## 2011-11-10 DIAGNOSIS — N390 Urinary tract infection, site not specified: Secondary | ICD-10-CM | POA: Diagnosis not present

## 2011-11-10 DIAGNOSIS — E785 Hyperlipidemia, unspecified: Secondary | ICD-10-CM | POA: Diagnosis not present

## 2011-11-10 LAB — URINE CULTURE

## 2011-11-12 DIAGNOSIS — S72009D Fracture of unspecified part of neck of unspecified femur, subsequent encounter for closed fracture with routine healing: Secondary | ICD-10-CM | POA: Diagnosis not present

## 2011-11-12 DIAGNOSIS — R262 Difficulty in walking, not elsewhere classified: Secondary | ICD-10-CM | POA: Diagnosis not present

## 2011-11-12 DIAGNOSIS — N390 Urinary tract infection, site not specified: Secondary | ICD-10-CM | POA: Diagnosis not present

## 2011-11-12 DIAGNOSIS — G8911 Acute pain due to trauma: Secondary | ICD-10-CM | POA: Diagnosis not present

## 2011-11-12 DIAGNOSIS — J45909 Unspecified asthma, uncomplicated: Secondary | ICD-10-CM | POA: Diagnosis not present

## 2011-11-12 DIAGNOSIS — I10 Essential (primary) hypertension: Secondary | ICD-10-CM | POA: Diagnosis not present

## 2011-11-13 DIAGNOSIS — J45909 Unspecified asthma, uncomplicated: Secondary | ICD-10-CM | POA: Diagnosis not present

## 2011-11-13 DIAGNOSIS — R262 Difficulty in walking, not elsewhere classified: Secondary | ICD-10-CM | POA: Diagnosis not present

## 2011-11-13 DIAGNOSIS — S72009D Fracture of unspecified part of neck of unspecified femur, subsequent encounter for closed fracture with routine healing: Secondary | ICD-10-CM | POA: Diagnosis not present

## 2011-11-13 DIAGNOSIS — G8911 Acute pain due to trauma: Secondary | ICD-10-CM | POA: Diagnosis not present

## 2011-11-13 DIAGNOSIS — N390 Urinary tract infection, site not specified: Secondary | ICD-10-CM | POA: Diagnosis not present

## 2011-11-13 DIAGNOSIS — I10 Essential (primary) hypertension: Secondary | ICD-10-CM | POA: Diagnosis not present

## 2011-11-15 DIAGNOSIS — S72009D Fracture of unspecified part of neck of unspecified femur, subsequent encounter for closed fracture with routine healing: Secondary | ICD-10-CM | POA: Diagnosis not present

## 2011-11-15 DIAGNOSIS — G8911 Acute pain due to trauma: Secondary | ICD-10-CM | POA: Diagnosis not present

## 2011-11-15 DIAGNOSIS — J45909 Unspecified asthma, uncomplicated: Secondary | ICD-10-CM | POA: Diagnosis not present

## 2011-11-15 DIAGNOSIS — I10 Essential (primary) hypertension: Secondary | ICD-10-CM | POA: Diagnosis not present

## 2011-11-15 DIAGNOSIS — N390 Urinary tract infection, site not specified: Secondary | ICD-10-CM | POA: Diagnosis not present

## 2011-11-15 DIAGNOSIS — R262 Difficulty in walking, not elsewhere classified: Secondary | ICD-10-CM | POA: Diagnosis not present

## 2011-11-17 DIAGNOSIS — S72009D Fracture of unspecified part of neck of unspecified femur, subsequent encounter for closed fracture with routine healing: Secondary | ICD-10-CM | POA: Diagnosis not present

## 2011-11-17 DIAGNOSIS — J45909 Unspecified asthma, uncomplicated: Secondary | ICD-10-CM | POA: Diagnosis not present

## 2011-11-17 DIAGNOSIS — I10 Essential (primary) hypertension: Secondary | ICD-10-CM | POA: Diagnosis not present

## 2011-11-17 DIAGNOSIS — G8911 Acute pain due to trauma: Secondary | ICD-10-CM | POA: Diagnosis not present

## 2011-11-17 DIAGNOSIS — N390 Urinary tract infection, site not specified: Secondary | ICD-10-CM | POA: Diagnosis not present

## 2011-11-17 DIAGNOSIS — R262 Difficulty in walking, not elsewhere classified: Secondary | ICD-10-CM | POA: Diagnosis not present

## 2011-11-19 DIAGNOSIS — G8911 Acute pain due to trauma: Secondary | ICD-10-CM | POA: Diagnosis not present

## 2011-11-19 DIAGNOSIS — N390 Urinary tract infection, site not specified: Secondary | ICD-10-CM | POA: Diagnosis not present

## 2011-11-19 DIAGNOSIS — R262 Difficulty in walking, not elsewhere classified: Secondary | ICD-10-CM | POA: Diagnosis not present

## 2011-11-19 DIAGNOSIS — J45909 Unspecified asthma, uncomplicated: Secondary | ICD-10-CM | POA: Diagnosis not present

## 2011-11-19 DIAGNOSIS — I10 Essential (primary) hypertension: Secondary | ICD-10-CM | POA: Diagnosis not present

## 2011-11-19 DIAGNOSIS — S72009D Fracture of unspecified part of neck of unspecified femur, subsequent encounter for closed fracture with routine healing: Secondary | ICD-10-CM | POA: Diagnosis not present

## 2011-11-21 DIAGNOSIS — J45909 Unspecified asthma, uncomplicated: Secondary | ICD-10-CM | POA: Diagnosis not present

## 2011-11-21 DIAGNOSIS — G8911 Acute pain due to trauma: Secondary | ICD-10-CM | POA: Diagnosis not present

## 2011-11-21 DIAGNOSIS — S72009D Fracture of unspecified part of neck of unspecified femur, subsequent encounter for closed fracture with routine healing: Secondary | ICD-10-CM | POA: Diagnosis not present

## 2011-11-21 DIAGNOSIS — N390 Urinary tract infection, site not specified: Secondary | ICD-10-CM | POA: Diagnosis not present

## 2011-11-21 DIAGNOSIS — R262 Difficulty in walking, not elsewhere classified: Secondary | ICD-10-CM | POA: Diagnosis not present

## 2011-11-21 DIAGNOSIS — I10 Essential (primary) hypertension: Secondary | ICD-10-CM | POA: Diagnosis not present

## 2011-11-22 DIAGNOSIS — I10 Essential (primary) hypertension: Secondary | ICD-10-CM | POA: Diagnosis not present

## 2011-11-22 DIAGNOSIS — G8911 Acute pain due to trauma: Secondary | ICD-10-CM | POA: Diagnosis not present

## 2011-11-22 DIAGNOSIS — N390 Urinary tract infection, site not specified: Secondary | ICD-10-CM | POA: Diagnosis not present

## 2011-11-22 DIAGNOSIS — S72009D Fracture of unspecified part of neck of unspecified femur, subsequent encounter for closed fracture with routine healing: Secondary | ICD-10-CM | POA: Diagnosis not present

## 2011-11-22 DIAGNOSIS — J45909 Unspecified asthma, uncomplicated: Secondary | ICD-10-CM | POA: Diagnosis not present

## 2011-11-22 DIAGNOSIS — R262 Difficulty in walking, not elsewhere classified: Secondary | ICD-10-CM | POA: Diagnosis not present

## 2011-11-24 DIAGNOSIS — N390 Urinary tract infection, site not specified: Secondary | ICD-10-CM | POA: Diagnosis not present

## 2011-11-24 DIAGNOSIS — G8911 Acute pain due to trauma: Secondary | ICD-10-CM | POA: Diagnosis not present

## 2011-11-24 DIAGNOSIS — R262 Difficulty in walking, not elsewhere classified: Secondary | ICD-10-CM | POA: Diagnosis not present

## 2011-11-24 DIAGNOSIS — J45909 Unspecified asthma, uncomplicated: Secondary | ICD-10-CM | POA: Diagnosis not present

## 2011-11-24 DIAGNOSIS — S72009D Fracture of unspecified part of neck of unspecified femur, subsequent encounter for closed fracture with routine healing: Secondary | ICD-10-CM | POA: Diagnosis not present

## 2011-11-24 DIAGNOSIS — I10 Essential (primary) hypertension: Secondary | ICD-10-CM | POA: Diagnosis not present

## 2011-11-28 DIAGNOSIS — J45909 Unspecified asthma, uncomplicated: Secondary | ICD-10-CM | POA: Diagnosis not present

## 2011-11-28 DIAGNOSIS — S72009D Fracture of unspecified part of neck of unspecified femur, subsequent encounter for closed fracture with routine healing: Secondary | ICD-10-CM | POA: Diagnosis not present

## 2011-11-28 DIAGNOSIS — G8911 Acute pain due to trauma: Secondary | ICD-10-CM | POA: Diagnosis not present

## 2011-11-28 DIAGNOSIS — R262 Difficulty in walking, not elsewhere classified: Secondary | ICD-10-CM | POA: Diagnosis not present

## 2011-11-28 DIAGNOSIS — N390 Urinary tract infection, site not specified: Secondary | ICD-10-CM | POA: Diagnosis not present

## 2011-11-28 DIAGNOSIS — I10 Essential (primary) hypertension: Secondary | ICD-10-CM | POA: Diagnosis not present

## 2011-11-30 DIAGNOSIS — I10 Essential (primary) hypertension: Secondary | ICD-10-CM | POA: Diagnosis not present

## 2011-11-30 DIAGNOSIS — N390 Urinary tract infection, site not specified: Secondary | ICD-10-CM | POA: Diagnosis not present

## 2011-11-30 DIAGNOSIS — G8911 Acute pain due to trauma: Secondary | ICD-10-CM | POA: Diagnosis not present

## 2011-11-30 DIAGNOSIS — R262 Difficulty in walking, not elsewhere classified: Secondary | ICD-10-CM | POA: Diagnosis not present

## 2011-11-30 DIAGNOSIS — J45909 Unspecified asthma, uncomplicated: Secondary | ICD-10-CM | POA: Diagnosis not present

## 2011-11-30 DIAGNOSIS — S72009D Fracture of unspecified part of neck of unspecified femur, subsequent encounter for closed fracture with routine healing: Secondary | ICD-10-CM | POA: Diagnosis not present

## 2011-12-02 DIAGNOSIS — S72009D Fracture of unspecified part of neck of unspecified femur, subsequent encounter for closed fracture with routine healing: Secondary | ICD-10-CM | POA: Diagnosis not present

## 2011-12-02 DIAGNOSIS — I10 Essential (primary) hypertension: Secondary | ICD-10-CM | POA: Diagnosis not present

## 2011-12-02 DIAGNOSIS — G8911 Acute pain due to trauma: Secondary | ICD-10-CM | POA: Diagnosis not present

## 2011-12-02 DIAGNOSIS — R262 Difficulty in walking, not elsewhere classified: Secondary | ICD-10-CM | POA: Diagnosis not present

## 2011-12-02 DIAGNOSIS — N390 Urinary tract infection, site not specified: Secondary | ICD-10-CM | POA: Diagnosis not present

## 2011-12-02 DIAGNOSIS — J45909 Unspecified asthma, uncomplicated: Secondary | ICD-10-CM | POA: Diagnosis not present

## 2011-12-05 DIAGNOSIS — G8911 Acute pain due to trauma: Secondary | ICD-10-CM | POA: Diagnosis not present

## 2011-12-05 DIAGNOSIS — R262 Difficulty in walking, not elsewhere classified: Secondary | ICD-10-CM | POA: Diagnosis not present

## 2011-12-05 DIAGNOSIS — I10 Essential (primary) hypertension: Secondary | ICD-10-CM | POA: Diagnosis not present

## 2011-12-05 DIAGNOSIS — S72009D Fracture of unspecified part of neck of unspecified femur, subsequent encounter for closed fracture with routine healing: Secondary | ICD-10-CM | POA: Diagnosis not present

## 2011-12-05 DIAGNOSIS — J45909 Unspecified asthma, uncomplicated: Secondary | ICD-10-CM | POA: Diagnosis not present

## 2011-12-05 DIAGNOSIS — N390 Urinary tract infection, site not specified: Secondary | ICD-10-CM | POA: Diagnosis not present

## 2011-12-07 DIAGNOSIS — G8911 Acute pain due to trauma: Secondary | ICD-10-CM | POA: Diagnosis not present

## 2011-12-07 DIAGNOSIS — S72009D Fracture of unspecified part of neck of unspecified femur, subsequent encounter for closed fracture with routine healing: Secondary | ICD-10-CM | POA: Diagnosis not present

## 2011-12-07 DIAGNOSIS — R262 Difficulty in walking, not elsewhere classified: Secondary | ICD-10-CM | POA: Diagnosis not present

## 2011-12-07 DIAGNOSIS — J45909 Unspecified asthma, uncomplicated: Secondary | ICD-10-CM | POA: Diagnosis not present

## 2011-12-07 DIAGNOSIS — N390 Urinary tract infection, site not specified: Secondary | ICD-10-CM | POA: Diagnosis not present

## 2011-12-07 DIAGNOSIS — I10 Essential (primary) hypertension: Secondary | ICD-10-CM | POA: Diagnosis not present

## 2011-12-08 DIAGNOSIS — S72009D Fracture of unspecified part of neck of unspecified femur, subsequent encounter for closed fracture with routine healing: Secondary | ICD-10-CM | POA: Diagnosis not present

## 2011-12-08 DIAGNOSIS — G8911 Acute pain due to trauma: Secondary | ICD-10-CM | POA: Diagnosis not present

## 2011-12-08 DIAGNOSIS — J45909 Unspecified asthma, uncomplicated: Secondary | ICD-10-CM | POA: Diagnosis not present

## 2011-12-08 DIAGNOSIS — I10 Essential (primary) hypertension: Secondary | ICD-10-CM | POA: Diagnosis not present

## 2011-12-08 DIAGNOSIS — N390 Urinary tract infection, site not specified: Secondary | ICD-10-CM | POA: Diagnosis not present

## 2011-12-08 DIAGNOSIS — R262 Difficulty in walking, not elsewhere classified: Secondary | ICD-10-CM | POA: Diagnosis not present

## 2011-12-08 DIAGNOSIS — S72109A Unspecified trochanteric fracture of unspecified femur, initial encounter for closed fracture: Secondary | ICD-10-CM | POA: Diagnosis not present

## 2011-12-09 DIAGNOSIS — R262 Difficulty in walking, not elsewhere classified: Secondary | ICD-10-CM | POA: Diagnosis not present

## 2011-12-09 DIAGNOSIS — J45909 Unspecified asthma, uncomplicated: Secondary | ICD-10-CM | POA: Diagnosis not present

## 2011-12-09 DIAGNOSIS — G8911 Acute pain due to trauma: Secondary | ICD-10-CM | POA: Diagnosis not present

## 2011-12-09 DIAGNOSIS — I10 Essential (primary) hypertension: Secondary | ICD-10-CM | POA: Diagnosis not present

## 2011-12-09 DIAGNOSIS — N390 Urinary tract infection, site not specified: Secondary | ICD-10-CM | POA: Diagnosis not present

## 2011-12-09 DIAGNOSIS — S72009D Fracture of unspecified part of neck of unspecified femur, subsequent encounter for closed fracture with routine healing: Secondary | ICD-10-CM | POA: Diagnosis not present

## 2011-12-12 DIAGNOSIS — S72009D Fracture of unspecified part of neck of unspecified femur, subsequent encounter for closed fracture with routine healing: Secondary | ICD-10-CM | POA: Diagnosis not present

## 2011-12-12 DIAGNOSIS — R262 Difficulty in walking, not elsewhere classified: Secondary | ICD-10-CM | POA: Diagnosis not present

## 2011-12-12 DIAGNOSIS — G8911 Acute pain due to trauma: Secondary | ICD-10-CM | POA: Diagnosis not present

## 2011-12-12 DIAGNOSIS — I10 Essential (primary) hypertension: Secondary | ICD-10-CM | POA: Diagnosis not present

## 2011-12-12 DIAGNOSIS — J45909 Unspecified asthma, uncomplicated: Secondary | ICD-10-CM | POA: Diagnosis not present

## 2011-12-12 DIAGNOSIS — N390 Urinary tract infection, site not specified: Secondary | ICD-10-CM | POA: Diagnosis not present

## 2011-12-13 DIAGNOSIS — H2510 Age-related nuclear cataract, unspecified eye: Secondary | ICD-10-CM | POA: Diagnosis not present

## 2011-12-13 DIAGNOSIS — H402290 Chronic angle-closure glaucoma, unspecified eye, stage unspecified: Secondary | ICD-10-CM | POA: Diagnosis not present

## 2011-12-13 DIAGNOSIS — H353190 Nonexudative age-related macular degeneration, unspecified eye, stage unspecified: Secondary | ICD-10-CM | POA: Diagnosis not present

## 2011-12-14 DIAGNOSIS — R262 Difficulty in walking, not elsewhere classified: Secondary | ICD-10-CM | POA: Diagnosis not present

## 2011-12-14 DIAGNOSIS — G8911 Acute pain due to trauma: Secondary | ICD-10-CM | POA: Diagnosis not present

## 2011-12-14 DIAGNOSIS — S72009D Fracture of unspecified part of neck of unspecified femur, subsequent encounter for closed fracture with routine healing: Secondary | ICD-10-CM | POA: Diagnosis not present

## 2011-12-14 DIAGNOSIS — I10 Essential (primary) hypertension: Secondary | ICD-10-CM | POA: Diagnosis not present

## 2011-12-14 DIAGNOSIS — J45909 Unspecified asthma, uncomplicated: Secondary | ICD-10-CM | POA: Diagnosis not present

## 2011-12-14 DIAGNOSIS — N390 Urinary tract infection, site not specified: Secondary | ICD-10-CM | POA: Diagnosis not present

## 2011-12-16 DIAGNOSIS — J45909 Unspecified asthma, uncomplicated: Secondary | ICD-10-CM | POA: Diagnosis not present

## 2011-12-16 DIAGNOSIS — N390 Urinary tract infection, site not specified: Secondary | ICD-10-CM | POA: Diagnosis not present

## 2011-12-16 DIAGNOSIS — G8911 Acute pain due to trauma: Secondary | ICD-10-CM | POA: Diagnosis not present

## 2011-12-16 DIAGNOSIS — S72009D Fracture of unspecified part of neck of unspecified femur, subsequent encounter for closed fracture with routine healing: Secondary | ICD-10-CM | POA: Diagnosis not present

## 2011-12-16 DIAGNOSIS — I10 Essential (primary) hypertension: Secondary | ICD-10-CM | POA: Diagnosis not present

## 2011-12-16 DIAGNOSIS — R262 Difficulty in walking, not elsewhere classified: Secondary | ICD-10-CM | POA: Diagnosis not present

## 2012-01-02 DIAGNOSIS — H40039 Anatomical narrow angle, unspecified eye: Secondary | ICD-10-CM | POA: Diagnosis not present

## 2012-01-09 DIAGNOSIS — H35389 Toxic maculopathy, unspecified eye: Secondary | ICD-10-CM | POA: Diagnosis not present

## 2012-01-31 DIAGNOSIS — H353290 Exudative age-related macular degeneration, unspecified eye, stage unspecified: Secondary | ICD-10-CM | POA: Diagnosis not present

## 2012-02-07 DIAGNOSIS — F329 Major depressive disorder, single episode, unspecified: Secondary | ICD-10-CM | POA: Diagnosis not present

## 2012-02-07 DIAGNOSIS — G4700 Insomnia, unspecified: Secondary | ICD-10-CM | POA: Diagnosis not present

## 2012-02-07 DIAGNOSIS — I10 Essential (primary) hypertension: Secondary | ICD-10-CM | POA: Diagnosis not present

## 2012-02-07 DIAGNOSIS — M129 Arthropathy, unspecified: Secondary | ICD-10-CM | POA: Diagnosis not present

## 2012-02-07 DIAGNOSIS — I69928 Other speech and language deficits following unspecified cerebrovascular disease: Secondary | ICD-10-CM | POA: Diagnosis not present

## 2012-02-07 DIAGNOSIS — E782 Mixed hyperlipidemia: Secondary | ICD-10-CM | POA: Diagnosis not present

## 2012-02-07 DIAGNOSIS — Z79899 Other long term (current) drug therapy: Secondary | ICD-10-CM | POA: Diagnosis not present

## 2012-02-08 DIAGNOSIS — H353290 Exudative age-related macular degeneration, unspecified eye, stage unspecified: Secondary | ICD-10-CM | POA: Diagnosis not present

## 2012-02-28 DIAGNOSIS — H353290 Exudative age-related macular degeneration, unspecified eye, stage unspecified: Secondary | ICD-10-CM | POA: Diagnosis not present

## 2012-03-09 DIAGNOSIS — H353290 Exudative age-related macular degeneration, unspecified eye, stage unspecified: Secondary | ICD-10-CM | POA: Diagnosis not present

## 2012-03-27 DIAGNOSIS — J209 Acute bronchitis, unspecified: Secondary | ICD-10-CM | POA: Diagnosis not present

## 2012-03-27 DIAGNOSIS — R5381 Other malaise: Secondary | ICD-10-CM | POA: Diagnosis not present

## 2012-03-27 DIAGNOSIS — R05 Cough: Secondary | ICD-10-CM | POA: Diagnosis not present

## 2012-03-27 DIAGNOSIS — R062 Wheezing: Secondary | ICD-10-CM | POA: Diagnosis not present

## 2012-03-27 DIAGNOSIS — J45909 Unspecified asthma, uncomplicated: Secondary | ICD-10-CM | POA: Diagnosis not present

## 2012-03-27 DIAGNOSIS — R5383 Other fatigue: Secondary | ICD-10-CM | POA: Diagnosis not present

## 2012-04-06 DIAGNOSIS — H353290 Exudative age-related macular degeneration, unspecified eye, stage unspecified: Secondary | ICD-10-CM | POA: Diagnosis not present

## 2012-04-09 DIAGNOSIS — H353290 Exudative age-related macular degeneration, unspecified eye, stage unspecified: Secondary | ICD-10-CM | POA: Diagnosis not present

## 2012-04-26 DIAGNOSIS — R55 Syncope and collapse: Secondary | ICD-10-CM | POA: Diagnosis not present

## 2012-04-26 DIAGNOSIS — I69928 Other speech and language deficits following unspecified cerebrovascular disease: Secondary | ICD-10-CM | POA: Diagnosis not present

## 2012-04-26 DIAGNOSIS — I359 Nonrheumatic aortic valve disorder, unspecified: Secondary | ICD-10-CM | POA: Diagnosis not present

## 2012-04-26 DIAGNOSIS — K219 Gastro-esophageal reflux disease without esophagitis: Secondary | ICD-10-CM | POA: Diagnosis not present

## 2012-04-26 DIAGNOSIS — M129 Arthropathy, unspecified: Secondary | ICD-10-CM | POA: Diagnosis not present

## 2012-04-26 DIAGNOSIS — I369 Nonrheumatic tricuspid valve disorder, unspecified: Secondary | ICD-10-CM | POA: Diagnosis not present

## 2012-04-30 DIAGNOSIS — H353290 Exudative age-related macular degeneration, unspecified eye, stage unspecified: Secondary | ICD-10-CM | POA: Diagnosis not present

## 2012-05-04 DIAGNOSIS — I359 Nonrheumatic aortic valve disorder, unspecified: Secondary | ICD-10-CM | POA: Diagnosis not present

## 2012-05-04 DIAGNOSIS — I499 Cardiac arrhythmia, unspecified: Secondary | ICD-10-CM | POA: Diagnosis not present

## 2012-05-07 DIAGNOSIS — H353290 Exudative age-related macular degeneration, unspecified eye, stage unspecified: Secondary | ICD-10-CM | POA: Diagnosis not present

## 2012-05-28 DIAGNOSIS — H353290 Exudative age-related macular degeneration, unspecified eye, stage unspecified: Secondary | ICD-10-CM | POA: Diagnosis not present

## 2012-06-05 DIAGNOSIS — H353290 Exudative age-related macular degeneration, unspecified eye, stage unspecified: Secondary | ICD-10-CM | POA: Diagnosis not present

## 2012-06-07 DIAGNOSIS — I10 Essential (primary) hypertension: Secondary | ICD-10-CM | POA: Diagnosis not present

## 2012-06-07 DIAGNOSIS — I69928 Other speech and language deficits following unspecified cerebrovascular disease: Secondary | ICD-10-CM | POA: Diagnosis not present

## 2012-06-07 DIAGNOSIS — M79609 Pain in unspecified limb: Secondary | ICD-10-CM | POA: Diagnosis not present

## 2012-06-07 DIAGNOSIS — F411 Generalized anxiety disorder: Secondary | ICD-10-CM | POA: Diagnosis not present

## 2012-06-07 DIAGNOSIS — E782 Mixed hyperlipidemia: Secondary | ICD-10-CM | POA: Diagnosis not present

## 2012-07-04 DIAGNOSIS — E782 Mixed hyperlipidemia: Secondary | ICD-10-CM | POA: Diagnosis not present

## 2012-07-04 DIAGNOSIS — E039 Hypothyroidism, unspecified: Secondary | ICD-10-CM | POA: Diagnosis not present

## 2012-07-04 DIAGNOSIS — I10 Essential (primary) hypertension: Secondary | ICD-10-CM | POA: Diagnosis not present

## 2012-07-04 DIAGNOSIS — Z79899 Other long term (current) drug therapy: Secondary | ICD-10-CM | POA: Diagnosis not present

## 2012-07-09 DIAGNOSIS — I69992 Facial weakness following unspecified cerebrovascular disease: Secondary | ICD-10-CM | POA: Diagnosis not present

## 2012-07-09 DIAGNOSIS — I69921 Dysphasia following unspecified cerebrovascular disease: Secondary | ICD-10-CM | POA: Diagnosis not present

## 2012-07-09 DIAGNOSIS — I10 Essential (primary) hypertension: Secondary | ICD-10-CM | POA: Diagnosis not present

## 2012-07-09 DIAGNOSIS — R0989 Other specified symptoms and signs involving the circulatory and respiratory systems: Secondary | ICD-10-CM | POA: Diagnosis not present

## 2012-07-09 DIAGNOSIS — M129 Arthropathy, unspecified: Secondary | ICD-10-CM | POA: Diagnosis not present

## 2012-07-09 DIAGNOSIS — Z006 Encounter for examination for normal comparison and control in clinical research program: Secondary | ICD-10-CM | POA: Diagnosis not present

## 2012-07-09 DIAGNOSIS — E782 Mixed hyperlipidemia: Secondary | ICD-10-CM | POA: Diagnosis not present

## 2012-07-11 DIAGNOSIS — H353290 Exudative age-related macular degeneration, unspecified eye, stage unspecified: Secondary | ICD-10-CM | POA: Diagnosis not present

## 2012-07-25 DIAGNOSIS — H353290 Exudative age-related macular degeneration, unspecified eye, stage unspecified: Secondary | ICD-10-CM | POA: Diagnosis not present

## 2012-08-20 DIAGNOSIS — N390 Urinary tract infection, site not specified: Secondary | ICD-10-CM | POA: Diagnosis not present

## 2012-08-20 DIAGNOSIS — M129 Arthropathy, unspecified: Secondary | ICD-10-CM | POA: Diagnosis not present

## 2012-08-20 DIAGNOSIS — J441 Chronic obstructive pulmonary disease with (acute) exacerbation: Secondary | ICD-10-CM | POA: Diagnosis not present

## 2012-08-20 DIAGNOSIS — J449 Chronic obstructive pulmonary disease, unspecified: Secondary | ICD-10-CM | POA: Diagnosis not present

## 2012-08-20 DIAGNOSIS — F329 Major depressive disorder, single episode, unspecified: Secondary | ICD-10-CM | POA: Diagnosis not present

## 2012-08-20 DIAGNOSIS — G4700 Insomnia, unspecified: Secondary | ICD-10-CM | POA: Diagnosis not present

## 2012-08-20 DIAGNOSIS — I69928 Other speech and language deficits following unspecified cerebrovascular disease: Secondary | ICD-10-CM | POA: Diagnosis not present

## 2012-08-21 DIAGNOSIS — H40059 Ocular hypertension, unspecified eye: Secondary | ICD-10-CM | POA: Diagnosis not present

## 2012-08-27 DIAGNOSIS — H353290 Exudative age-related macular degeneration, unspecified eye, stage unspecified: Secondary | ICD-10-CM | POA: Diagnosis not present

## 2012-09-11 DIAGNOSIS — H353290 Exudative age-related macular degeneration, unspecified eye, stage unspecified: Secondary | ICD-10-CM | POA: Diagnosis not present

## 2012-10-01 DIAGNOSIS — H353290 Exudative age-related macular degeneration, unspecified eye, stage unspecified: Secondary | ICD-10-CM | POA: Diagnosis not present

## 2012-10-24 DIAGNOSIS — H353290 Exudative age-related macular degeneration, unspecified eye, stage unspecified: Secondary | ICD-10-CM | POA: Diagnosis not present

## 2012-11-07 DIAGNOSIS — Z90710 Acquired absence of both cervix and uterus: Secondary | ICD-10-CM | POA: Diagnosis not present

## 2012-11-07 DIAGNOSIS — Z882 Allergy status to sulfonamides status: Secondary | ICD-10-CM | POA: Diagnosis not present

## 2012-11-07 DIAGNOSIS — R300 Dysuria: Secondary | ICD-10-CM | POA: Diagnosis not present

## 2012-11-07 DIAGNOSIS — Z886 Allergy status to analgesic agent status: Secondary | ICD-10-CM | POA: Diagnosis not present

## 2012-11-07 DIAGNOSIS — I10 Essential (primary) hypertension: Secondary | ICD-10-CM | POA: Diagnosis not present

## 2012-11-07 DIAGNOSIS — N390 Urinary tract infection, site not specified: Secondary | ICD-10-CM | POA: Diagnosis not present

## 2012-11-19 DIAGNOSIS — H353290 Exudative age-related macular degeneration, unspecified eye, stage unspecified: Secondary | ICD-10-CM | POA: Diagnosis not present

## 2012-11-23 DIAGNOSIS — N390 Urinary tract infection, site not specified: Secondary | ICD-10-CM | POA: Diagnosis not present

## 2012-11-23 DIAGNOSIS — R319 Hematuria, unspecified: Secondary | ICD-10-CM | POA: Diagnosis not present

## 2012-11-23 DIAGNOSIS — R300 Dysuria: Secondary | ICD-10-CM | POA: Diagnosis not present

## 2012-11-23 DIAGNOSIS — D649 Anemia, unspecified: Secondary | ICD-10-CM | POA: Diagnosis not present

## 2012-11-27 ENCOUNTER — Inpatient Hospital Stay: Payer: Self-pay | Admitting: Family Medicine

## 2012-11-27 DIAGNOSIS — I69998 Other sequelae following unspecified cerebrovascular disease: Secondary | ICD-10-CM | POA: Diagnosis not present

## 2012-11-27 DIAGNOSIS — Z7902 Long term (current) use of antithrombotics/antiplatelets: Secondary | ICD-10-CM | POA: Diagnosis not present

## 2012-11-27 DIAGNOSIS — R319 Hematuria, unspecified: Secondary | ICD-10-CM | POA: Diagnosis not present

## 2012-11-27 DIAGNOSIS — N390 Urinary tract infection, site not specified: Secondary | ICD-10-CM | POA: Diagnosis not present

## 2012-11-27 DIAGNOSIS — F411 Generalized anxiety disorder: Secondary | ICD-10-CM | POA: Diagnosis present

## 2012-11-27 DIAGNOSIS — R310 Gross hematuria: Secondary | ICD-10-CM | POA: Diagnosis present

## 2012-11-27 DIAGNOSIS — I69928 Other speech and language deficits following unspecified cerebrovascular disease: Secondary | ICD-10-CM | POA: Diagnosis not present

## 2012-11-27 DIAGNOSIS — I359 Nonrheumatic aortic valve disorder, unspecified: Secondary | ICD-10-CM | POA: Diagnosis not present

## 2012-11-27 DIAGNOSIS — Z86711 Personal history of pulmonary embolism: Secondary | ICD-10-CM | POA: Diagnosis not present

## 2012-11-27 DIAGNOSIS — Z45018 Encounter for adjustment and management of other part of cardiac pacemaker: Secondary | ICD-10-CM | POA: Diagnosis not present

## 2012-11-27 DIAGNOSIS — I2699 Other pulmonary embolism without acute cor pulmonale: Secondary | ICD-10-CM | POA: Diagnosis not present

## 2012-11-27 DIAGNOSIS — N1330 Unspecified hydronephrosis: Secondary | ICD-10-CM | POA: Diagnosis not present

## 2012-11-27 DIAGNOSIS — I498 Other specified cardiac arrhythmias: Secondary | ICD-10-CM | POA: Diagnosis not present

## 2012-11-27 DIAGNOSIS — E785 Hyperlipidemia, unspecified: Secondary | ICD-10-CM | POA: Diagnosis present

## 2012-11-27 DIAGNOSIS — I495 Sick sinus syndrome: Secondary | ICD-10-CM | POA: Diagnosis not present

## 2012-11-27 DIAGNOSIS — R5381 Other malaise: Secondary | ICD-10-CM | POA: Diagnosis present

## 2012-11-27 DIAGNOSIS — N134 Hydroureter: Secondary | ICD-10-CM | POA: Diagnosis not present

## 2012-11-27 DIAGNOSIS — J449 Chronic obstructive pulmonary disease, unspecified: Secondary | ICD-10-CM | POA: Diagnosis present

## 2012-11-27 DIAGNOSIS — Z23 Encounter for immunization: Secondary | ICD-10-CM | POA: Diagnosis not present

## 2012-11-27 DIAGNOSIS — D649 Anemia, unspecified: Secondary | ICD-10-CM | POA: Diagnosis present

## 2012-11-27 DIAGNOSIS — I10 Essential (primary) hypertension: Secondary | ICD-10-CM | POA: Diagnosis not present

## 2012-11-27 LAB — URINALYSIS, COMPLETE
Bacteria: NONE SEEN
Bilirubin,UR: NEGATIVE
Ketone: NEGATIVE
Leukocyte Esterase: NEGATIVE
Nitrite: NEGATIVE
Ph: 8 (ref 4.5–8.0)
Protein: NEGATIVE
RBC,UR: 74 /HPF (ref 0–5)
Specific Gravity: 1.009 (ref 1.003–1.030)
Squamous Epithelial: NONE SEEN

## 2012-11-27 LAB — COMPREHENSIVE METABOLIC PANEL
Albumin: 3.9 g/dL (ref 3.4–5.0)
Alkaline Phosphatase: 97 U/L (ref 50–136)
BUN: 13 mg/dL (ref 7–18)
Calcium, Total: 9.6 mg/dL (ref 8.5–10.1)
Chloride: 105 mmol/L (ref 98–107)
EGFR (African American): >60
EGFR (Non-African Amer.): >60
Osmolality: 279 (ref 275–301)
Potassium: 4 mmol/L (ref 3.5–5.1)
SGPT (ALT): 22 U/L (ref 12–78)
Total Protein: 7 g/dL (ref 6.4–8.2)

## 2012-11-27 LAB — CBC WITH DIFFERENTIAL/PLATELET
Basophil #: 0.1 10*3/uL (ref 0.0–0.1)
Basophil %: 1.9 %
Eosinophil #: 0.2 10*3/uL (ref 0.0–0.7)
HCT: 37.1 % (ref 35.0–47.0)
HGB: 12.6 g/dL (ref 12.0–16.0)
Lymphocyte %: 30 %
MCV: 79 fL — ABNORMAL LOW (ref 80–100)
Monocyte #: 0.3 x10 3/mm (ref 0.2–0.9)
Monocyte %: 7.2 %
Neutrophil #: 2.4 10*3/uL (ref 1.4–6.5)
Neutrophil %: 56.2 %
Platelet: 243 10*3/uL (ref 150–440)
RBC: 4.68 10*6/uL (ref 3.80–5.20)
RDW: 14 % (ref 11.5–14.5)
WBC: 4.4 10*3/uL (ref 3.6–11.0)

## 2012-11-28 LAB — COMPREHENSIVE METABOLIC PANEL
Albumin: 3.8 g/dL (ref 3.4–5.0)
Anion Gap: 5 — ABNORMAL LOW (ref 7–16)
Calcium, Total: 9.4 mg/dL (ref 8.5–10.1)
Chloride: 103 mmol/L (ref 98–107)
Co2: 31 mmol/L (ref 21–32)
Creatinine: 1.27 mg/dL (ref 0.60–1.30)
EGFR (African American): 47 — ABNORMAL LOW
EGFR (Non-African Amer.): 40 — ABNORMAL LOW
Glucose: 99 mg/dL (ref 65–99)
Osmolality: 279 (ref 275–301)
Potassium: 4 mmol/L (ref 3.5–5.1)
SGOT(AST): 29 U/L (ref 15–37)
SGPT (ALT): 22 U/L (ref 12–78)
Sodium: 139 mmol/L (ref 136–145)
Total Protein: 6.8 g/dL (ref 6.4–8.2)

## 2012-11-28 LAB — CBC WITH DIFFERENTIAL/PLATELET
Basophil %: 1 %
Eosinophil #: 0.2 10*3/uL (ref 0.0–0.7)
HGB: 12.7 g/dL (ref 12.0–16.0)
Lymphocyte %: 22.4 %
MCH: 26.2 pg (ref 26.0–34.0)
MCHC: 33.4 g/dL (ref 32.0–36.0)
MCV: 79 fL — ABNORMAL LOW (ref 80–100)
Monocyte %: 5.9 %
Neutrophil %: 68.2 %
Platelet: 260 10*3/uL (ref 150–440)
RBC: 4.83 10*6/uL (ref 3.80–5.20)
RDW: 14 % (ref 11.5–14.5)

## 2012-11-28 LAB — PROTIME-INR: INR: 1.3

## 2012-12-17 DIAGNOSIS — I495 Sick sinus syndrome: Secondary | ICD-10-CM | POA: Diagnosis not present

## 2012-12-18 DIAGNOSIS — I69921 Dysphasia following unspecified cerebrovascular disease: Secondary | ICD-10-CM | POA: Diagnosis not present

## 2012-12-18 DIAGNOSIS — I10 Essential (primary) hypertension: Secondary | ICD-10-CM | POA: Diagnosis not present

## 2012-12-18 DIAGNOSIS — D649 Anemia, unspecified: Secondary | ICD-10-CM | POA: Diagnosis not present

## 2012-12-18 DIAGNOSIS — R319 Hematuria, unspecified: Secondary | ICD-10-CM | POA: Diagnosis not present

## 2012-12-18 DIAGNOSIS — I499 Cardiac arrhythmia, unspecified: Secondary | ICD-10-CM | POA: Diagnosis not present

## 2012-12-24 DIAGNOSIS — H353290 Exudative age-related macular degeneration, unspecified eye, stage unspecified: Secondary | ICD-10-CM | POA: Diagnosis not present

## 2012-12-31 DIAGNOSIS — F411 Generalized anxiety disorder: Secondary | ICD-10-CM | POA: Diagnosis not present

## 2012-12-31 DIAGNOSIS — Z79899 Other long term (current) drug therapy: Secondary | ICD-10-CM | POA: Diagnosis not present

## 2013-02-02 IMAGING — CR PELVIS - 1-2 VIEW
1 series · 1 of 1 positions shown · non-contrast
Comparison: none

REASON FOR EXAM: post op in PACU
COMMENTS:   Bedside (portable):Y

PROCEDURE:     DXR - DXR PELVIS AP ONLY  - June 10, 2011  [DATE]
RESULT:     AP view the pelvis shows a patient status post right hip
arthroplasty. Surgical drains are present. There is no evidence of an
immediate postoperative bone or hardware complication.

[ap]
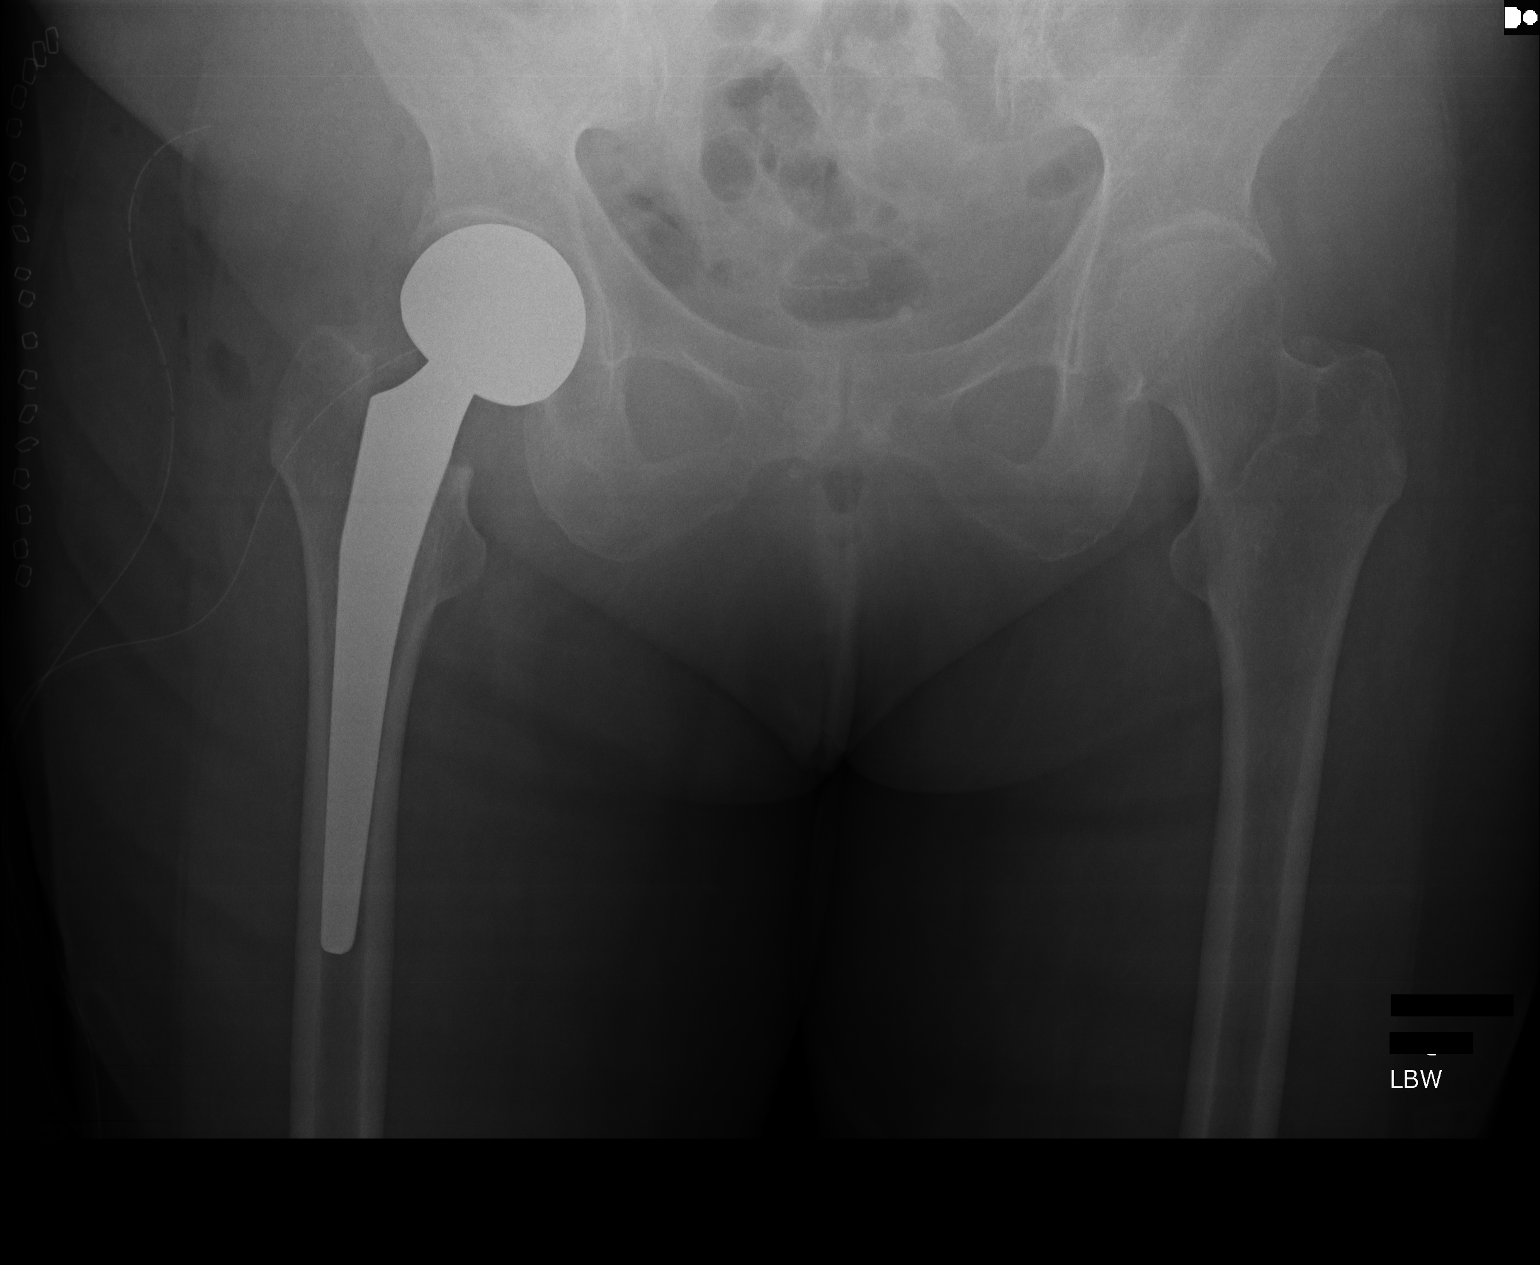

[1 of 1 positions shown; findings below may reference images not displayed]

IMPRESSION: 1. Please see above.

## 2013-02-03 IMAGING — CT CT HEAD WITHOUT CONTRAST
2 series · 16 of 30 positions shown, 20 images · non-contrast
Comparison: none

REASON FOR EXAM: patient fell on head now with MS changes and PE
COMMENTS:

PROCEDURE:     CT  - CT HEAD WITHOUT CONTRAST  - June 11, 2011 [DATE]
RESULT:     Comparison is made to prior study dated 11/14/2008.
TECHNIQUE: Helical noncontrasted 5 mm sections were obtained from the skull
base to the vertex.

[Series 2: without · axial · non-contrast · 0.42mm/px · z∈[+396,+520]mm · 13 of 31 slices shown, 17 images]
[im 3/31  brain]
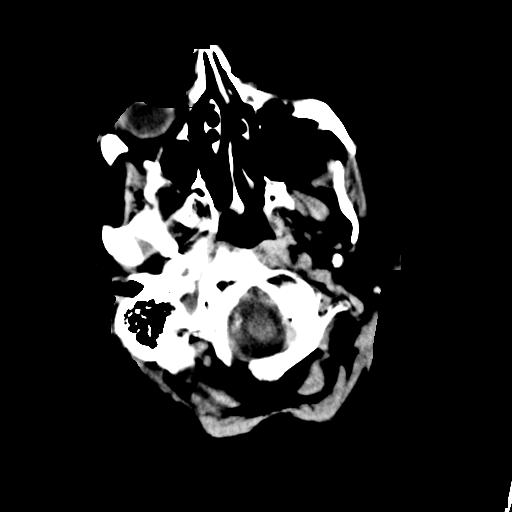
[im 3/31  bone]
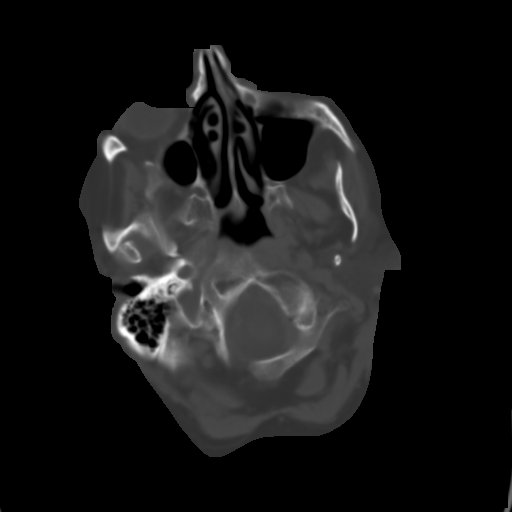
[im 5/31  brain]
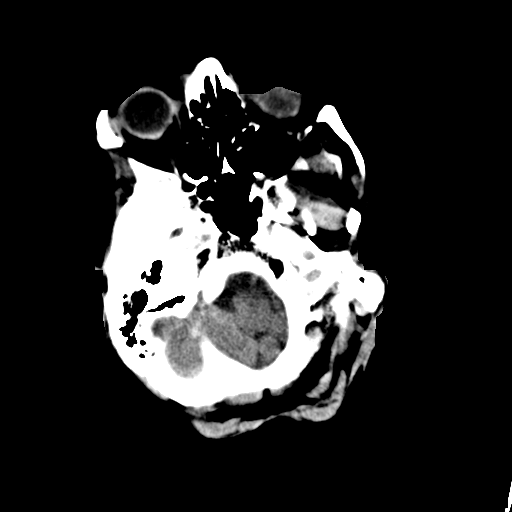
[im 7/31  brain]
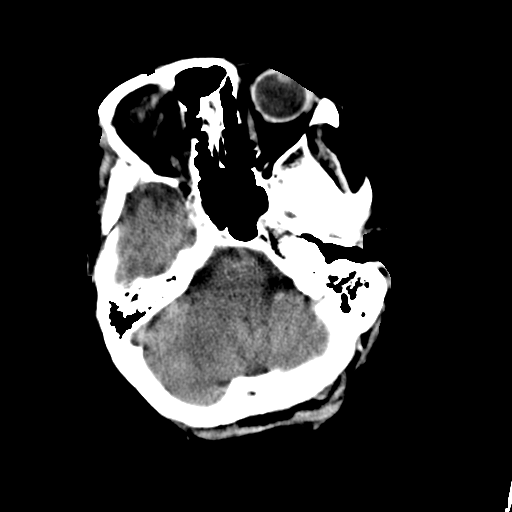
[im 9/31  brain]
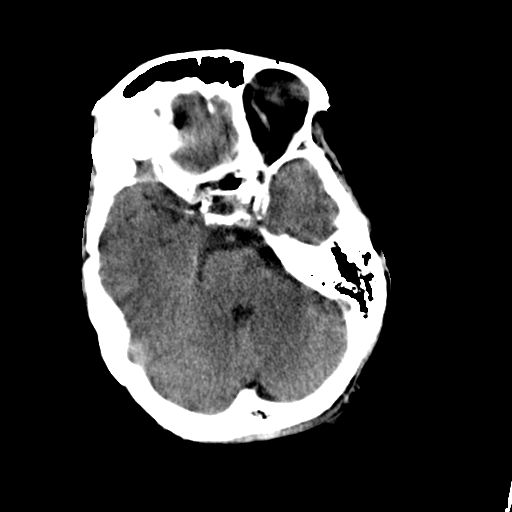
[im 11/31  brain]
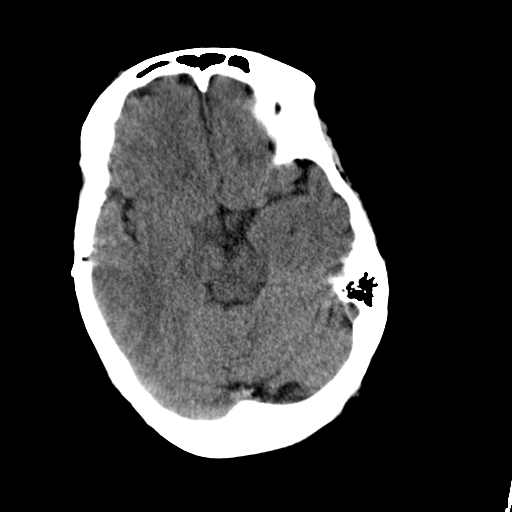
[im 11/31  bone]
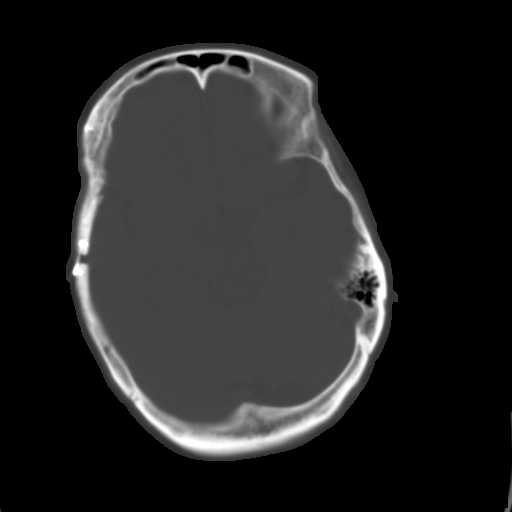
[im 13/31  brain]
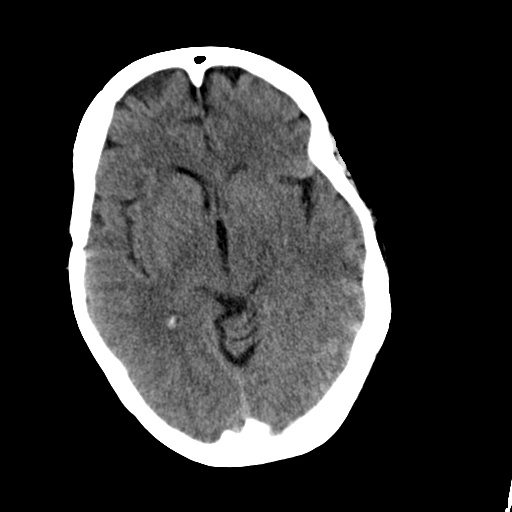
[im 16/31  brain]
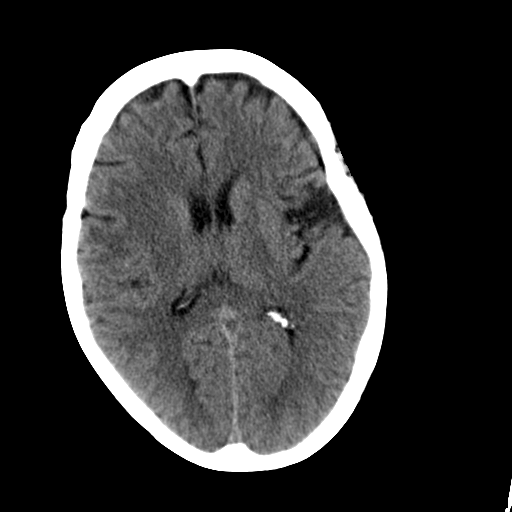
[im 18/31  brain]
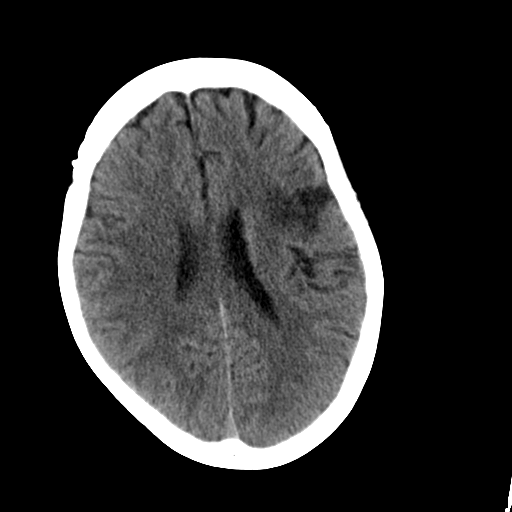
[im 20/31  brain]
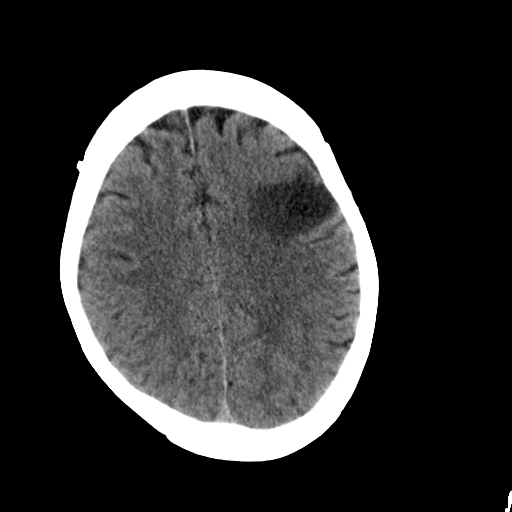
[im 20/31  bone]
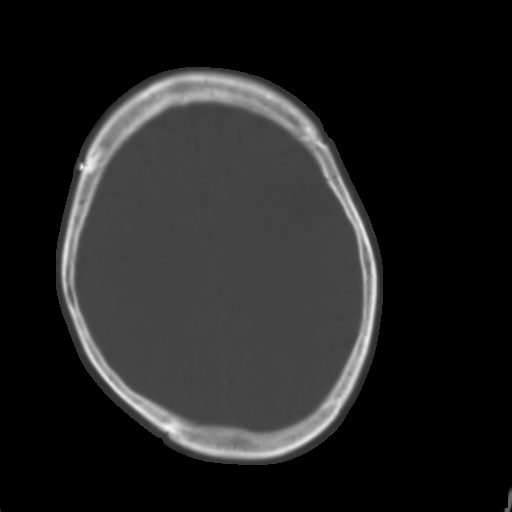
[im 22/31  brain]
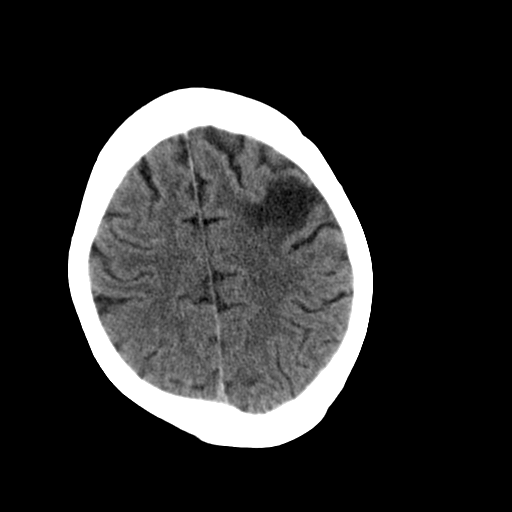
[im 24/31  brain]
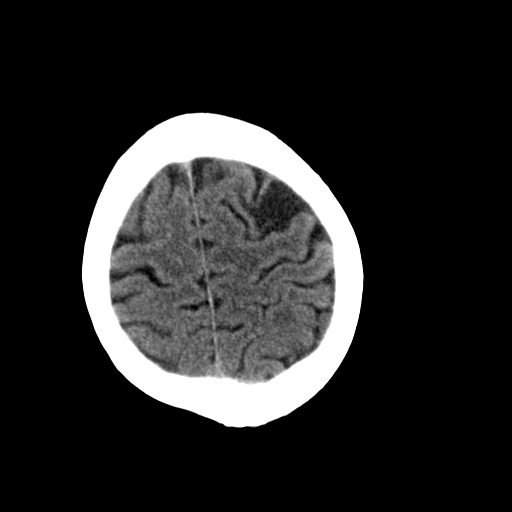
[im 26/31  brain]
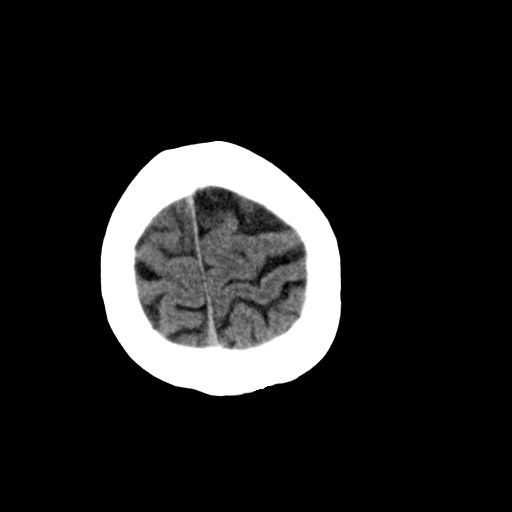
[im 28/31  brain]
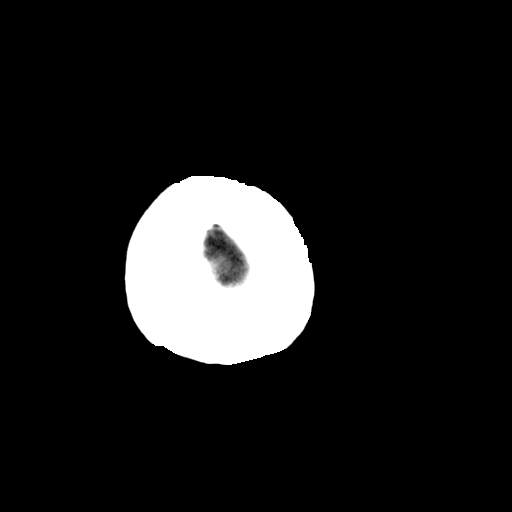
[im 28/31  bone]
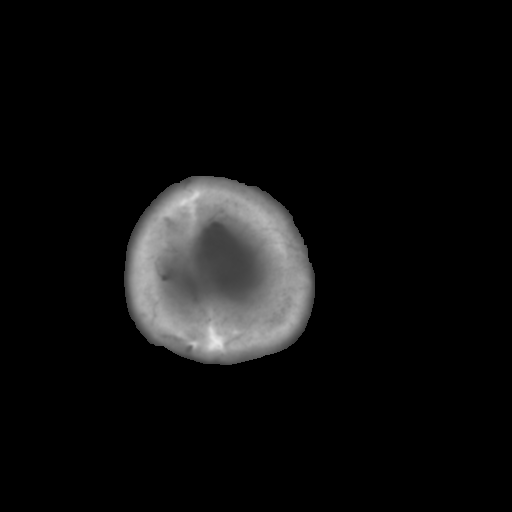

[Series 3: bone · axial · 0.42mm/px · z∈[+396,+436]mm · 3 of 31 slices shown]
[im 3/31  bone]
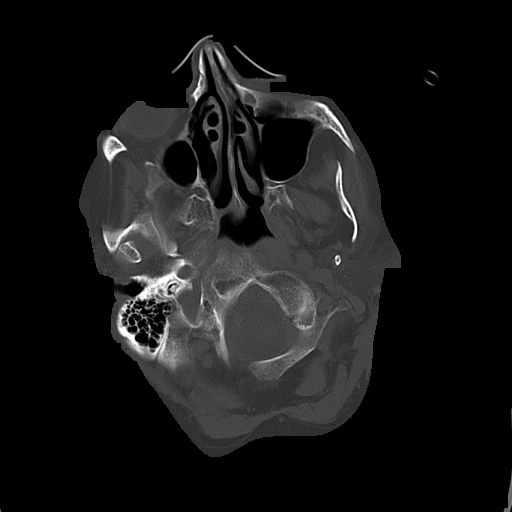
[im 7/31  bone]
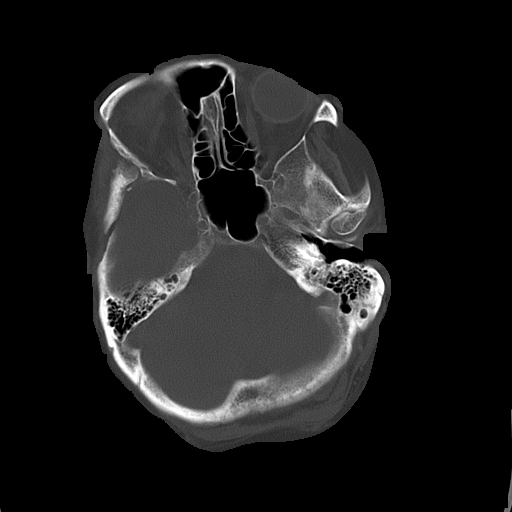
[im 11/31  bone]
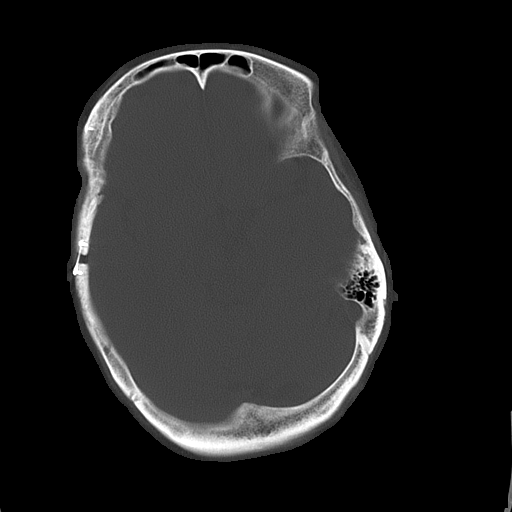

[16 of 30 positions shown; findings below may reference images not displayed]

FINDINGS: An area of encephalomalacic change projects within the right
frontal region indicative of an area of chronic MCA distribution infarction.
No further intra-axial or extra-axial fluid collections are identified nor
evidence of acute hemorrhage. The ventricles and cisterns are patent. There
is no evidence of mass effect. There is no evidence of a depressed skull
fracture. Postsurgical change is identified within the right temporal region.
IMPRESSION: 1. No evidence of acute intracranial abnormalities.
2. Chronic changes.

## 2013-02-04 DIAGNOSIS — H353290 Exudative age-related macular degeneration, unspecified eye, stage unspecified: Secondary | ICD-10-CM | POA: Diagnosis not present

## 2013-02-05 IMAGING — US US EXTREM LOW VENOUS BILAT
1 series · 18 of 24 positions shown · non-contrast
Comparison: none

REASON FOR EXAM: edema. sob
COMMENTS:

PROCEDURE:     US  - US DOPPLER LOW EXTR BILATERAL  - June 13, 2011  [DATE]
RESULT:     Bilateral lower extremity color-flow upper duplex Doppler is
normal.

[Series 1: us extrem low venous bilat · 18 of 41 slices shown]
[im 1/41]
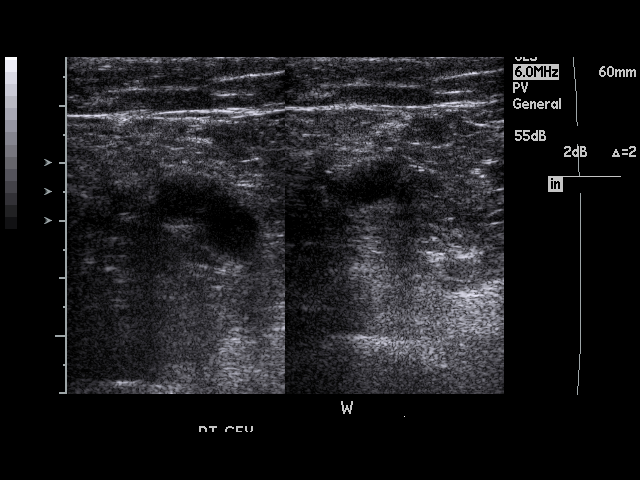
[im 4/41]
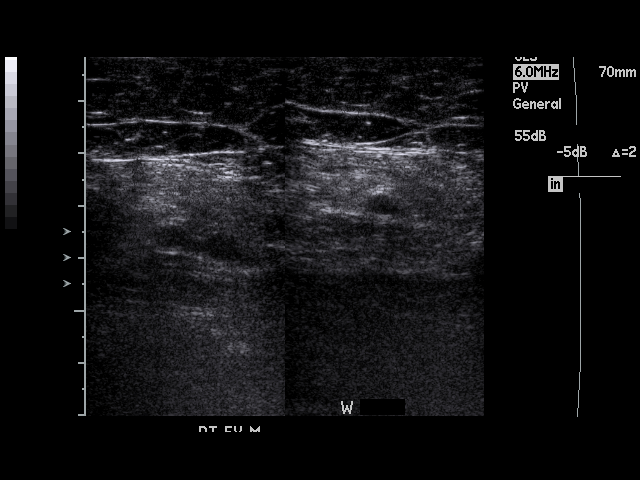
[im 6/41]
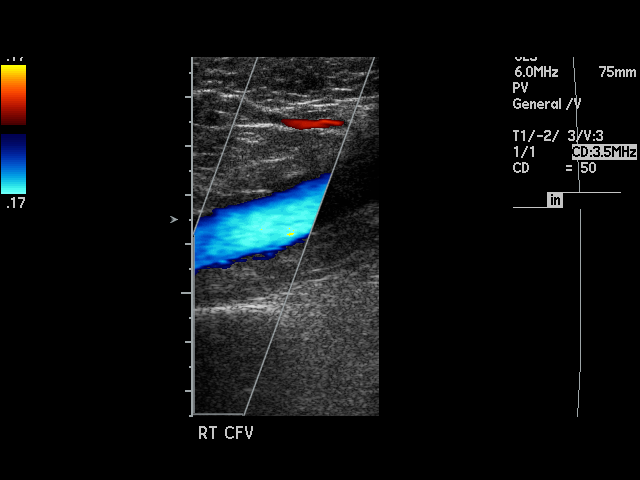
[im 7/41]
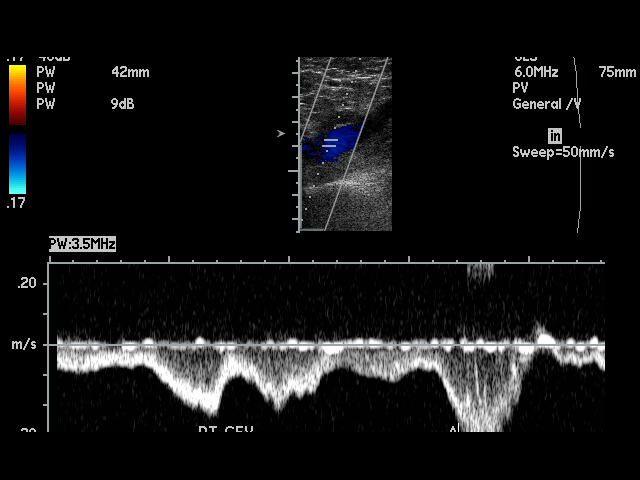
[im 11/41]
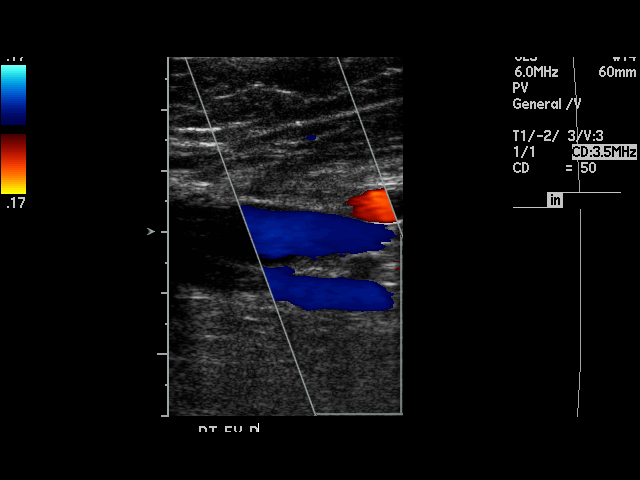
[im 13/41]
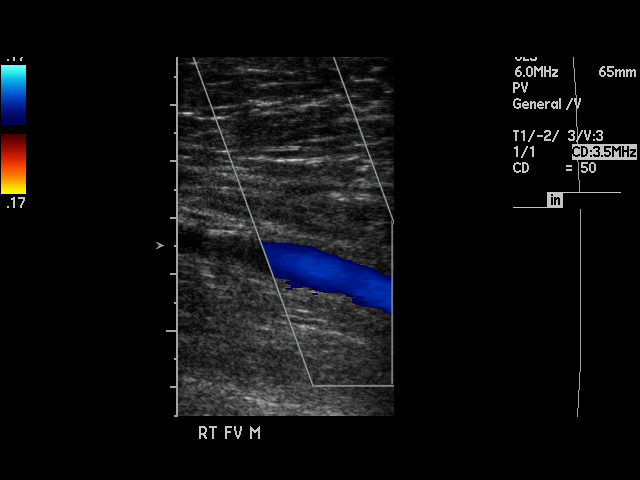
[im 14/41]
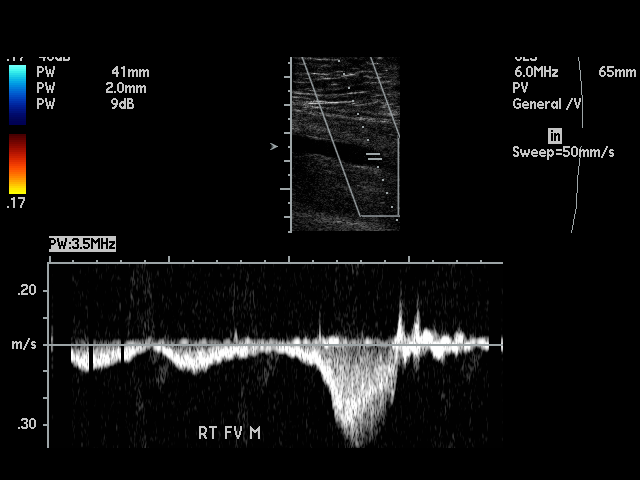
[im 18/41]
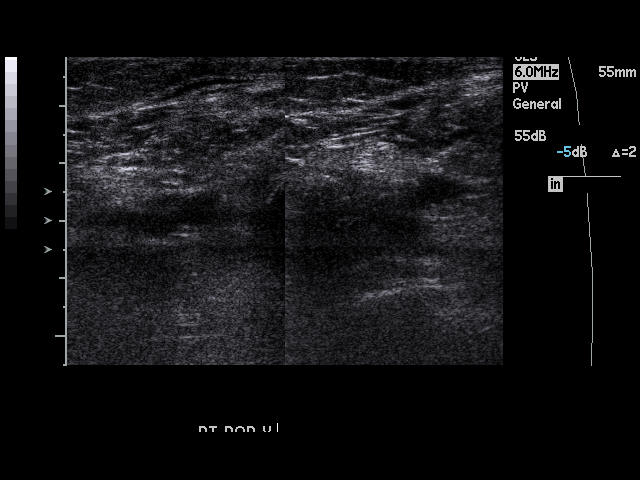
[im 20/41]
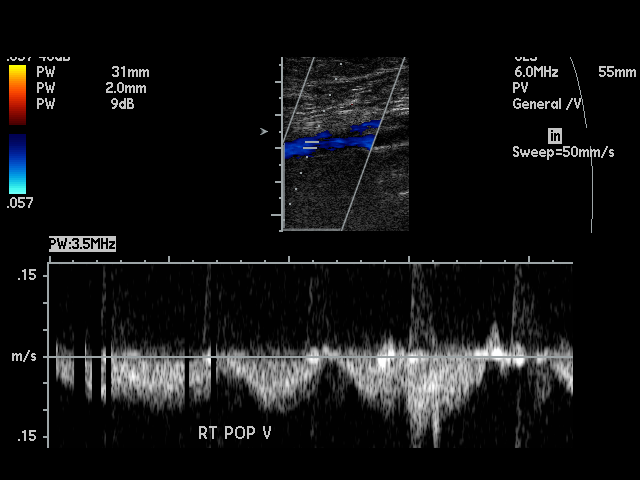
[im 21/41]
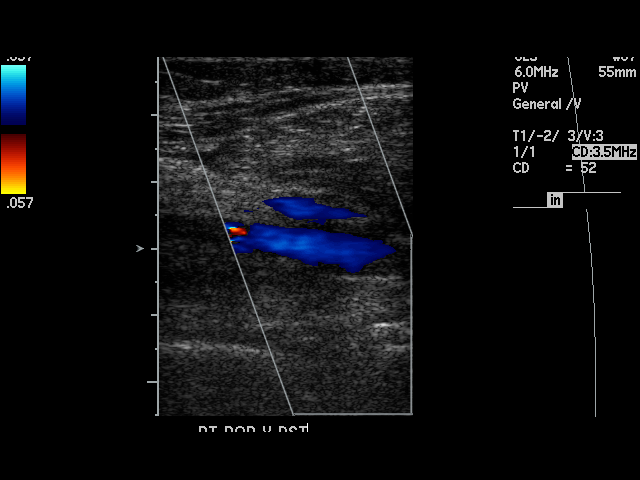
[im 25/41]
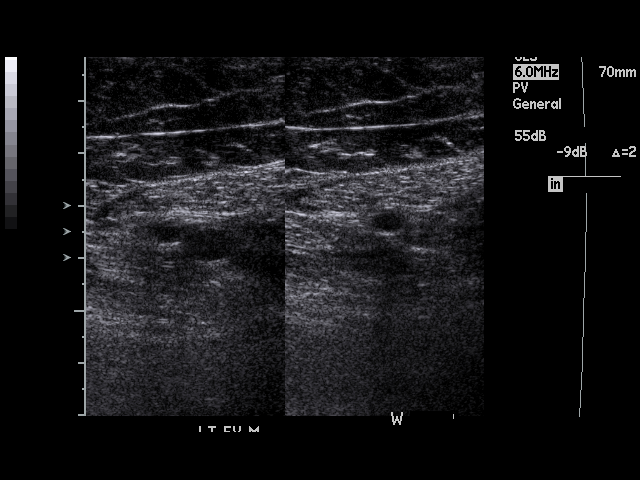
[im 27/41]
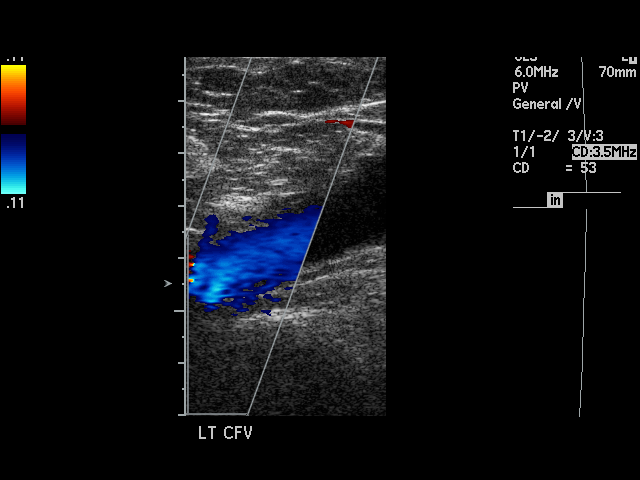
[im 28/41]
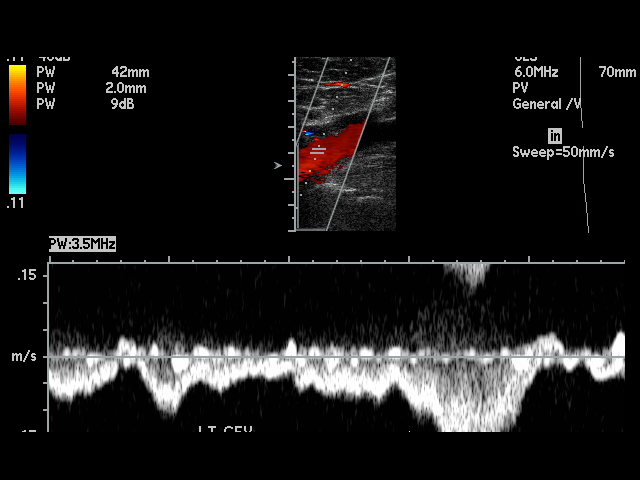
[im 32/41]
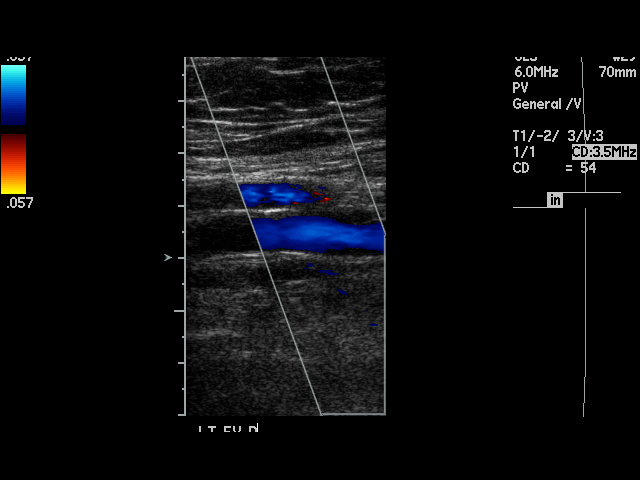
[im 34/41]
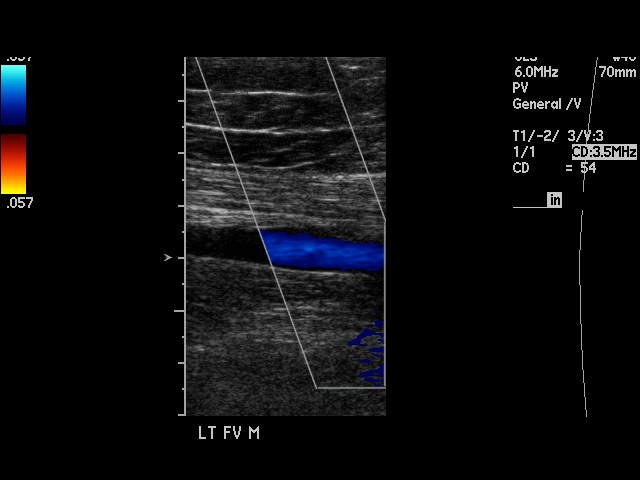
[im 35/41]
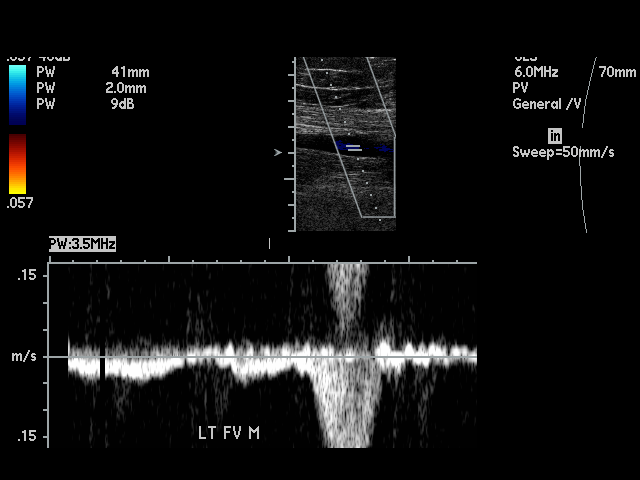
[im 39/41]
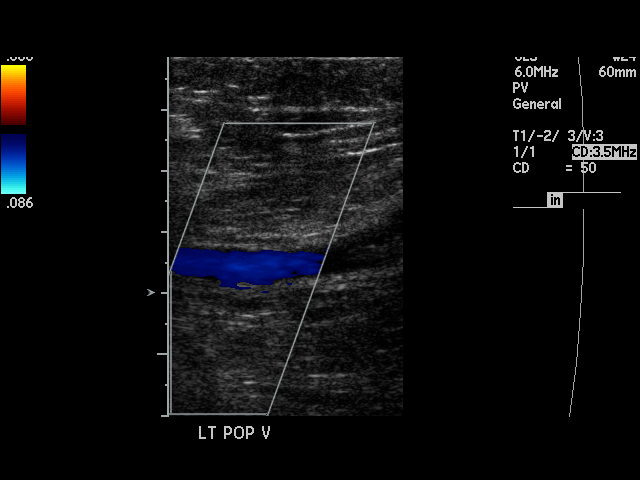
[im 41/41]
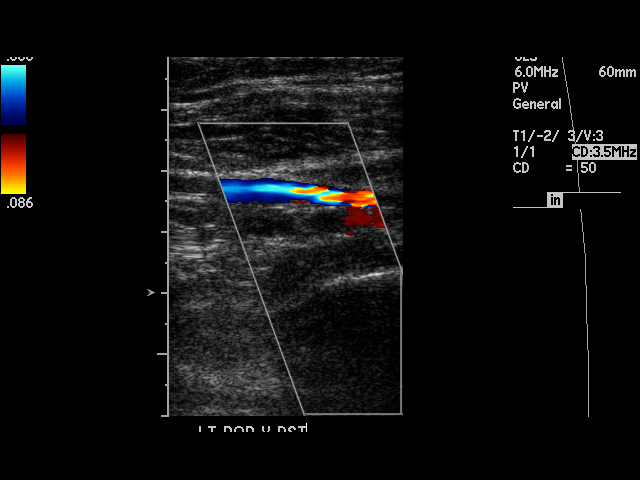

[18 of 24 positions shown; findings below may reference images not displayed]

IMPRESSION: Normal exam.

## 2013-03-08 DIAGNOSIS — H353290 Exudative age-related macular degeneration, unspecified eye, stage unspecified: Secondary | ICD-10-CM | POA: Diagnosis not present

## 2013-03-08 DIAGNOSIS — H40009 Preglaucoma, unspecified, unspecified eye: Secondary | ICD-10-CM | POA: Diagnosis not present

## 2013-03-25 DIAGNOSIS — S20219A Contusion of unspecified front wall of thorax, initial encounter: Secondary | ICD-10-CM | POA: Diagnosis not present

## 2013-04-08 DIAGNOSIS — H353290 Exudative age-related macular degeneration, unspecified eye, stage unspecified: Secondary | ICD-10-CM | POA: Diagnosis not present

## 2013-04-08 DIAGNOSIS — E039 Hypothyroidism, unspecified: Secondary | ICD-10-CM | POA: Diagnosis not present

## 2013-04-08 DIAGNOSIS — Z23 Encounter for immunization: Secondary | ICD-10-CM | POA: Diagnosis not present

## 2013-04-08 DIAGNOSIS — I69919 Unspecified symptoms and signs involving cognitive functions following unspecified cerebrovascular disease: Secondary | ICD-10-CM | POA: Diagnosis not present

## 2013-04-08 DIAGNOSIS — Z79899 Other long term (current) drug therapy: Secondary | ICD-10-CM | POA: Diagnosis not present

## 2013-04-08 DIAGNOSIS — M159 Polyosteoarthritis, unspecified: Secondary | ICD-10-CM | POA: Diagnosis not present

## 2013-04-08 DIAGNOSIS — I10 Essential (primary) hypertension: Secondary | ICD-10-CM | POA: Diagnosis not present

## 2013-04-08 DIAGNOSIS — G4700 Insomnia, unspecified: Secondary | ICD-10-CM | POA: Diagnosis not present

## 2013-05-17 DIAGNOSIS — H353290 Exudative age-related macular degeneration, unspecified eye, stage unspecified: Secondary | ICD-10-CM | POA: Diagnosis not present

## 2013-06-03 DIAGNOSIS — H353290 Exudative age-related macular degeneration, unspecified eye, stage unspecified: Secondary | ICD-10-CM | POA: Diagnosis not present

## 2013-06-17 DIAGNOSIS — I739 Peripheral vascular disease, unspecified: Secondary | ICD-10-CM | POA: Diagnosis not present

## 2013-06-24 DIAGNOSIS — E1142 Type 2 diabetes mellitus with diabetic polyneuropathy: Secondary | ICD-10-CM | POA: Diagnosis not present

## 2013-06-24 DIAGNOSIS — I8390 Asymptomatic varicose veins of unspecified lower extremity: Secondary | ICD-10-CM | POA: Diagnosis not present

## 2013-06-24 DIAGNOSIS — M216X9 Other acquired deformities of unspecified foot: Secondary | ICD-10-CM | POA: Diagnosis not present

## 2013-06-24 DIAGNOSIS — M79609 Pain in unspecified limb: Secondary | ICD-10-CM | POA: Diagnosis not present

## 2013-07-05 DIAGNOSIS — H353290 Exudative age-related macular degeneration, unspecified eye, stage unspecified: Secondary | ICD-10-CM | POA: Diagnosis not present

## 2013-07-29 ENCOUNTER — Emergency Department: Payer: Self-pay | Admitting: Emergency Medicine

## 2013-07-29 DIAGNOSIS — R42 Dizziness and giddiness: Secondary | ICD-10-CM | POA: Diagnosis not present

## 2013-07-29 LAB — CBC WITH DIFFERENTIAL/PLATELET
Basophil #: 0.1 10*3/uL (ref 0.0–0.1)
Basophil %: 1.2 %
EOS PCT: 3.2 %
Eosinophil #: 0.2 10*3/uL (ref 0.0–0.7)
HCT: 36.2 % (ref 35.0–47.0)
HGB: 12 g/dL (ref 12.0–16.0)
LYMPHS ABS: 1.6 10*3/uL (ref 1.0–3.6)
LYMPHS PCT: 27.1 %
MCH: 26.6 pg (ref 26.0–34.0)
MCHC: 33.1 g/dL (ref 32.0–36.0)
MCV: 80 fL (ref 80–100)
MONO ABS: 0.4 x10 3/mm (ref 0.2–0.9)
Monocyte %: 6.3 %
Neutrophil #: 3.6 10*3/uL (ref 1.4–6.5)
Neutrophil %: 62.2 %
Platelet: 277 10*3/uL (ref 150–440)
RBC: 4.5 10*6/uL (ref 3.80–5.20)
RDW: 14.2 % (ref 11.5–14.5)
WBC: 5.8 10*3/uL (ref 3.6–11.0)

## 2013-07-29 LAB — URINALYSIS, COMPLETE
Bilirubin,UR: NEGATIVE
Blood: NEGATIVE
Glucose,UR: NEGATIVE mg/dL (ref 0–75)
Hyaline Cast: 38
Ketone: NEGATIVE
NITRITE: NEGATIVE
Ph: 5 (ref 4.5–8.0)
Protein: NEGATIVE
RBC,UR: 5 /HPF (ref 0–5)
SPECIFIC GRAVITY: 1.019 (ref 1.003–1.030)

## 2013-07-29 LAB — BASIC METABOLIC PANEL
Anion Gap: 5 — ABNORMAL LOW (ref 7–16)
BUN: 16 mg/dL (ref 7–18)
CALCIUM: 8.7 mg/dL (ref 8.5–10.1)
CHLORIDE: 105 mmol/L (ref 98–107)
CO2: 28 mmol/L (ref 21–32)
CREATININE: 0.98 mg/dL (ref 0.60–1.30)
EGFR (African American): >60
GFR CALC NON AF AMER: 55 — AB
Glucose: 90 mg/dL (ref 65–99)
OSMOLALITY: 276 (ref 275–301)
POTASSIUM: 3.8 mmol/L (ref 3.5–5.1)
SODIUM: 138 mmol/L (ref 136–145)

## 2013-07-30 ENCOUNTER — Emergency Department: Payer: Self-pay | Admitting: Emergency Medicine

## 2013-07-30 DIAGNOSIS — I10 Essential (primary) hypertension: Secondary | ICD-10-CM | POA: Diagnosis not present

## 2013-07-30 DIAGNOSIS — Z79899 Other long term (current) drug therapy: Secondary | ICD-10-CM | POA: Diagnosis not present

## 2013-07-30 DIAGNOSIS — Z8673 Personal history of transient ischemic attack (TIA), and cerebral infarction without residual deficits: Secondary | ICD-10-CM | POA: Diagnosis not present

## 2013-07-30 DIAGNOSIS — I509 Heart failure, unspecified: Secondary | ICD-10-CM | POA: Diagnosis not present

## 2013-07-30 DIAGNOSIS — R9431 Abnormal electrocardiogram [ECG] [EKG]: Secondary | ICD-10-CM | POA: Diagnosis not present

## 2013-07-30 DIAGNOSIS — R55 Syncope and collapse: Secondary | ICD-10-CM | POA: Diagnosis not present

## 2013-07-30 DIAGNOSIS — N390 Urinary tract infection, site not specified: Secondary | ICD-10-CM | POA: Diagnosis not present

## 2013-07-30 DIAGNOSIS — R42 Dizziness and giddiness: Secondary | ICD-10-CM | POA: Diagnosis not present

## 2013-07-30 LAB — TROPONIN I: Troponin-I: <0.02 ng/mL

## 2013-07-30 LAB — CK-MB: CK-MB: 0.6 ng/mL (ref 0.5–3.6)

## 2013-07-31 LAB — URINE CULTURE

## 2013-09-02 DIAGNOSIS — I10 Essential (primary) hypertension: Secondary | ICD-10-CM | POA: Diagnosis not present

## 2013-09-02 DIAGNOSIS — G4700 Insomnia, unspecified: Secondary | ICD-10-CM | POA: Diagnosis not present

## 2013-09-02 DIAGNOSIS — E782 Mixed hyperlipidemia: Secondary | ICD-10-CM | POA: Diagnosis not present

## 2013-09-02 DIAGNOSIS — I495 Sick sinus syndrome: Secondary | ICD-10-CM | POA: Diagnosis not present

## 2013-09-02 DIAGNOSIS — N390 Urinary tract infection, site not specified: Secondary | ICD-10-CM | POA: Diagnosis not present

## 2013-10-21 DIAGNOSIS — H353190 Nonexudative age-related macular degeneration, unspecified eye, stage unspecified: Secondary | ICD-10-CM | POA: Diagnosis not present

## 2013-11-25 DIAGNOSIS — I495 Sick sinus syndrome: Secondary | ICD-10-CM | POA: Diagnosis not present

## 2013-11-25 DIAGNOSIS — I619 Nontraumatic intracerebral hemorrhage, unspecified: Secondary | ICD-10-CM | POA: Diagnosis not present

## 2013-11-25 DIAGNOSIS — H3530 Unspecified macular degeneration: Secondary | ICD-10-CM | POA: Diagnosis not present

## 2013-11-25 DIAGNOSIS — I69993 Ataxia following unspecified cerebrovascular disease: Secondary | ICD-10-CM | POA: Diagnosis not present

## 2013-11-29 DIAGNOSIS — H4010X0 Unspecified open-angle glaucoma, stage unspecified: Secondary | ICD-10-CM | POA: Diagnosis not present

## 2013-12-30 DIAGNOSIS — I10 Essential (primary) hypertension: Secondary | ICD-10-CM | POA: Diagnosis not present

## 2013-12-30 DIAGNOSIS — F411 Generalized anxiety disorder: Secondary | ICD-10-CM | POA: Diagnosis not present

## 2013-12-30 DIAGNOSIS — E782 Mixed hyperlipidemia: Secondary | ICD-10-CM | POA: Diagnosis not present

## 2013-12-30 DIAGNOSIS — R4789 Other speech disturbances: Secondary | ICD-10-CM | POA: Diagnosis not present

## 2013-12-30 DIAGNOSIS — J449 Chronic obstructive pulmonary disease, unspecified: Secondary | ICD-10-CM | POA: Diagnosis not present

## 2013-12-30 DIAGNOSIS — G4700 Insomnia, unspecified: Secondary | ICD-10-CM | POA: Diagnosis not present

## 2013-12-30 DIAGNOSIS — J309 Allergic rhinitis, unspecified: Secondary | ICD-10-CM | POA: Diagnosis not present

## 2013-12-30 DIAGNOSIS — I499 Cardiac arrhythmia, unspecified: Secondary | ICD-10-CM | POA: Diagnosis not present

## 2014-01-09 DIAGNOSIS — H3530 Unspecified macular degeneration: Secondary | ICD-10-CM | POA: Diagnosis not present

## 2014-01-09 DIAGNOSIS — Z950 Presence of cardiac pacemaker: Secondary | ICD-10-CM | POA: Diagnosis not present

## 2014-01-09 DIAGNOSIS — I495 Sick sinus syndrome: Secondary | ICD-10-CM | POA: Diagnosis not present

## 2014-02-10 DIAGNOSIS — H353290 Exudative age-related macular degeneration, unspecified eye, stage unspecified: Secondary | ICD-10-CM | POA: Diagnosis not present

## 2014-02-24 ENCOUNTER — Emergency Department: Payer: Self-pay | Admitting: Emergency Medicine

## 2014-03-03 DIAGNOSIS — S63522A Sprain of radiocarpal joint of left wrist, initial encounter: Secondary | ICD-10-CM | POA: Diagnosis not present

## 2014-03-20 DIAGNOSIS — Z45010 Encounter for checking and testing of cardiac pacemaker pulse generator [battery]: Secondary | ICD-10-CM | POA: Diagnosis not present

## 2014-03-20 DIAGNOSIS — Z950 Presence of cardiac pacemaker: Secondary | ICD-10-CM | POA: Diagnosis not present

## 2014-04-09 DIAGNOSIS — H3532 Exudative age-related macular degeneration: Secondary | ICD-10-CM | POA: Diagnosis not present

## 2014-05-05 DIAGNOSIS — R262 Difficulty in walking, not elsewhere classified: Secondary | ICD-10-CM | POA: Diagnosis not present

## 2014-05-05 DIAGNOSIS — I6931 Cognitive deficits following cerebral infarction: Secondary | ICD-10-CM | POA: Diagnosis not present

## 2014-05-05 DIAGNOSIS — N390 Urinary tract infection, site not specified: Secondary | ICD-10-CM | POA: Diagnosis not present

## 2014-05-05 DIAGNOSIS — J449 Chronic obstructive pulmonary disease, unspecified: Secondary | ICD-10-CM | POA: Diagnosis not present

## 2014-05-05 DIAGNOSIS — J441 Chronic obstructive pulmonary disease with (acute) exacerbation: Secondary | ICD-10-CM | POA: Diagnosis not present

## 2014-05-05 DIAGNOSIS — F5101 Primary insomnia: Secondary | ICD-10-CM | POA: Diagnosis not present

## 2014-05-05 DIAGNOSIS — I10 Essential (primary) hypertension: Secondary | ICD-10-CM | POA: Diagnosis not present

## 2014-05-05 DIAGNOSIS — I69321 Dysphasia following cerebral infarction: Secondary | ICD-10-CM | POA: Diagnosis not present

## 2014-05-05 DIAGNOSIS — M25551 Pain in right hip: Secondary | ICD-10-CM | POA: Diagnosis not present

## 2014-05-19 DIAGNOSIS — H3532 Exudative age-related macular degeneration: Secondary | ICD-10-CM | POA: Diagnosis not present

## 2014-06-20 DIAGNOSIS — Z950 Presence of cardiac pacemaker: Secondary | ICD-10-CM | POA: Diagnosis not present

## 2014-06-20 DIAGNOSIS — I10 Essential (primary) hypertension: Secondary | ICD-10-CM | POA: Diagnosis not present

## 2014-07-02 DIAGNOSIS — H3532 Exudative age-related macular degeneration: Secondary | ICD-10-CM | POA: Diagnosis not present

## 2014-07-07 DIAGNOSIS — H3532 Exudative age-related macular degeneration: Secondary | ICD-10-CM | POA: Diagnosis not present

## 2014-07-16 DIAGNOSIS — H402234 Chronic angle-closure glaucoma, bilateral, indeterminate stage: Secondary | ICD-10-CM | POA: Diagnosis not present

## 2014-08-04 DIAGNOSIS — E782 Mixed hyperlipidemia: Secondary | ICD-10-CM | POA: Diagnosis not present

## 2014-08-04 DIAGNOSIS — I10 Essential (primary) hypertension: Secondary | ICD-10-CM | POA: Diagnosis not present

## 2014-08-04 DIAGNOSIS — E039 Hypothyroidism, unspecified: Secondary | ICD-10-CM | POA: Diagnosis not present

## 2014-08-04 DIAGNOSIS — N390 Urinary tract infection, site not specified: Secondary | ICD-10-CM | POA: Diagnosis not present

## 2014-08-04 DIAGNOSIS — I69321 Dysphasia following cerebral infarction: Secondary | ICD-10-CM | POA: Diagnosis not present

## 2014-08-04 DIAGNOSIS — F5101 Primary insomnia: Secondary | ICD-10-CM | POA: Diagnosis not present

## 2014-08-04 DIAGNOSIS — E2839 Other primary ovarian failure: Secondary | ICD-10-CM | POA: Diagnosis not present

## 2014-08-04 DIAGNOSIS — F329 Major depressive disorder, single episode, unspecified: Secondary | ICD-10-CM | POA: Diagnosis not present

## 2014-08-06 DIAGNOSIS — H402234 Chronic angle-closure glaucoma, bilateral, indeterminate stage: Secondary | ICD-10-CM | POA: Diagnosis not present

## 2014-08-14 DIAGNOSIS — R011 Cardiac murmur, unspecified: Secondary | ICD-10-CM | POA: Diagnosis not present

## 2014-08-20 DIAGNOSIS — H2513 Age-related nuclear cataract, bilateral: Secondary | ICD-10-CM | POA: Diagnosis not present

## 2014-08-21 DIAGNOSIS — I6523 Occlusion and stenosis of bilateral carotid arteries: Secondary | ICD-10-CM | POA: Diagnosis not present

## 2014-09-05 DIAGNOSIS — H3532 Exudative age-related macular degeneration: Secondary | ICD-10-CM | POA: Diagnosis not present

## 2014-09-05 NOTE — H&P (Signed)
PATIENT NAME:  Michele Summers, Michele Summers MR#:  [id number] DATE OF BIRTH:  [birth year]  DATE OF ADMISSION:  11/27/2012  PRIMARY CARE PHYSICIAN: Dr. Clayborn Bigness  REQUESTING PHYSICIAN:  Delman Kitten, MD   CHIEF COMPLAINT: Hematuria.   HISTORY OF PRESENT ILLNESS: The patient is a 81 year old female with a known history of PE on Xarelto, depression, anxiety and recent hip fracture status post right hip hemiarthroplasty in January. She is being admitted for persistent gross hematuria. The patient reports having bloody urine for about 2 to 3 weeks, has had 1 or 2 visits with her primary care physician who prescribed her for antibiotics. She was also placed on Macrobid for prophylaxis for recurrent urinary tract infection. She continued having blood in the urine, associated with feeling weakness now for which she came to the Emergency Department. She was also having left flank pain for the last few days. While in the ED, she was found to have a blood pressure of 223/90 and her heart rate was all over the place, anywhere from low 30s to high 100s to 110s. She is being admitted for further evaluation and management. She did take her Xarelto yesterday, which is likely the cause contributor for her hematuria.   PAST MEDICAL HISTORY: 1. History of PE or fat emboli syndrome on Xarelto.  2. Asthma.  3. Hyperlipidemia.  4. Hypertension.  5. Recent hip fracture, status post right hemiarthroplasty. 6. Recurrent urinary tract infection on prophylaxis with Macrobid.  7. History of supraventricular tachycardia.  8. History of CVA with right-sided weakness.   PAST SURGICAL HISTORY:  1. Status post right hip ORIF.  2. Cholecystectomy.  3. History of cystoscopy by Dr. Jacqlyn Larsen and per the patient this was normal.  4. Hysterectomy.  5.   History of brain surgery secondary to bleed in the brain with evacuation of hematoma or decompression.   SOCIAL HISTORY: No smoking, alcohol or drug use.   FAMILY HISTORY: Mother died of stroke  at the age of 59. Father died of chronic obstructive pulmonary disease at the age of 4.  ALLERGIES TO MEDICATION:  MORPHINE, PERCOCET, SULFA AND BEE STINGS.   MEDICATION AT HOME: 1. Acetaminophen/hydrocodone 1 tablet every 6 hours as needed.  2. Advair 250/50 one puff b.i.d.  3. Alprazolam 0.25 mg p.o. daily.  4. Lovastatin 20 mg p.o. daily.  5. Lipitor 10 mg p.o. at bedtime.  6. Losartan 25 mg p.o. daily.  7. Norvasc 2.5 mg p.o. daily.  8. Potassium gluconate 250 mg p.o. daily.  9. Singulair once daily.  10. Tylenol 1000 mg p.o. every 6 hours as needed.  11. Vitamin B12 500 mcg p.o. daily. 12. Xarelto 20 mg p.o. daily.  13. Ambien 10 mg p.o. at bedtime  REVIEW OF SYSTEMS:  CONSTITUTIONAL: No fever. Positive fatigue and weakness.  EYES: No blurry or double vision.  ENT: No tinnitus or ear pain.  RESPIRATORY: No cough, wheezing, hemoptysis.  CARDIOVASCULAR: No chest pain. Positive for history of supraventricular tachycardia and now heart rate being variable.  No orthopnea or edema.  GASTROINTESTINAL: No nausea, vomiting, diarrhea.  GENITOURINARY: Positive for gross persistent hematuria for the last 2 to 3 weeks and also having left flank pain.  ENDOCRINE: No polyuria or nocturia.   HEMATOLOGY: No anemia or easy bruising.  SKIN: No rash or lesion.  MUSCULOSKELETAL: Pain in the right hip. No gout.  NEUROLOGIC: No tingling, numbness. Positive for generalized weakness. She does have a history of cerebrovascular accident.  PSYCHIATRY: History of anxiety present.  PHYSICAL EXAMINATION:  VITAL SIGNS: Temperature 97.4, heart rate 40 per minute and goes as high as up to 110s and was low, up to mid 30s. Respirations 18 per minute, blood pressure 223/90 mmHg, saturating 98% on room air.   GENERAL: The patient is a 81 year old female lying in the bed comfortably without any acute distress.   EYES: Pupils equal, round no scleral icterus. Extraocular muscles intact.   HEENT: Head  atraumatic, normocephalic. Oropharynx and nasopharynx clear.   NECK: No jugular venous distention. No thyroid enlargement or tenderness.   LUNGS: Clear to auscultation bilaterally. No wheezing, rales, rhonchi or crepitation.   CARDIOVASCULAR: S1, S2 normal. No murmurs, rubs or gallops.   ABDOMEN: Soft, nondistended. Bowel sounds present. No organomegaly or masses. She does have left flank tenderness. No guarding or rigidity.   EXTREMITIES: No pedal edema, cyanosis or clubbing.   SKIN: No obvious rash, lesion or ulcer.   NEUROLOGIC: Cranial nerves II through XII intact. Muscle strength 5/5 in all extremities. Sensation intact.   PSYCHIATRIC: The patient seems alert and oriented x 3.   MUSCULOSKELETAL: No joint effusion.   LABORATORY, DIAGNOSTIC, AND RADIOLOGICAL DATA: Normal BMP. Normal liver function tests. Normal CBC. Negative urinalysis.   Chest x-ray when in the ED showed lung fibrosis. Borderline cardiomegaly.   CT scan of the abdomen and pelvis in the ED showed mild hydronephrosis and hydroureter on the left.  Calcification in the distal left ureter and ureterovesical  junction which may reflect  stone, but there are numerous phleboliths present as well. The patient recently passed a stone.   Possible right kidney cyst. No acute hepatobiliary abnormality. No acute bowel abnormality. Small pericardial effusion and bibasilar atelectasis.   EKG shows marked sinus bradycardia with a rate of 39 per minute. No major ST-T changes.   IMPRESSION AND PLAN: 1. Persistent gross hematuria for the last 2 to 3 weeks likely due to Xarelto, could be worsened by recent passage of stone. This was discussed with Dr. Jacqlyn Larsen in the ED who did not feel there was any need for urology to see this patient in the hospital.  He recommended outpatient followup in 2 weeks.  We will stop Xarelto at this time and start her on continuous bladder irrigation. Monitor her closely for pain control.  2. Tachybrady  syndrome with heart rate ranging anywhere from the low 30s to 110s.  We will get cardiology consultation for further evaluation. Obtain 2-D echo.  3. Malignant hypertension.  We will increase the dose of her current blood pressure medication which she takes at home, start her on hydralazine, and also p.r.n. IV hydralazine for better blood pressure control. Get 2-D echocardiogram. 4. Recurrent urinary tract infection on oral Macrobid for prophylaxis which we will continue.  5. Hydroureter and hydronephrosis, likely due to recent passage of stone.  6. Asthma/chronic obstructive pulmonary disease. We will continue her home inhalers at this time. 7. Anxiety. We will continue alprazolam.  8. Deep vein thrombosis prophylaxis. We will provide bilateral SCDs and TEDs and start her on Protonix for stress ulcer prophylaxis.   CODE STATUS: Full code.   TOTAL TIME TAKING CARE OF THIS PATIENT: 55 minutes.    ____________________________ Lucina Mellow. Manuella Ghazi, MD vss:rw D: 11/27/2012 17:19:00 ET T: 11/27/2012 17:51:28 ET JOB#: [id number]  cc: [name], MD Tammala Weider S. Manuella Ghazi, MD, <Dictator> Lucina Mellow [facility] MD ELECTRONICALLY SIGNED 12/03/2012 13:08

## 2014-09-05 NOTE — Op Note (Signed)
PATIENT NAME:  Michele Summers, Michele Summers MR#:  [id number] DATE OF BIRTH:  Jun 19, 1931  DATE OF PROCEDURE:  11/30/2012  CLINICAL HISTORY:  Sick sinus syndrome with junctional bradycardia with the heart rate dropping to 30.  IMPLANTING PHYSICIAN:  Rinaldo Cloud, MD  ATTENDING PHYSICIAN:  Dr. Clayborn Bigness and Dr. Manuella Ghazi from the hospitalist.  ATTENDING CARDIOLOGIST: Dr. Rockey Situ and Dr Fletcher Anon.  PACEMAKER DATA: PACEMAKER MODEL: Adapta ADDR01, serial # [id number]. Mode: AAIRDDDR. Lower rate 60, upper rate 130, right ventricular lead 5092/52 cm, serial number [id number], atrial lead 5592, 45 cm, serial number [id number]. Threshold in the right ventricle 0.4 mA, impedance 601, R-wave 13.4. Atrial threshold:  P-wave of 2.8, , impedance 406, threshold 0.7. Current mA 1.9.   PROCEDURE:  The patient was taken to the Operating Room and the left upper chest was prepared with Hibiclens. It was draped in a sterile fashion. Next, 1% Xylocaine was infiltrated into the skin and subcutaneous tissue overlying the deltopectoral groove. Next, an incision was made on the skin overlying the deltopectoral pectoral groove. An incision was carried to the superficial fascia. Subcutaneous tissues were carefully dissected and the cephalic vein was isolated. A cutdown was made on the cephalic vein and the ventricular lead was passed through the cutdown of the cephalic vein and an optimum position was obtained at the floor of the right ventricle where stimulation thresholds were found to be satisfactory. The right ventricular lead was tied to the pectoralis major muscle on the top of the suture sleeve and also on the vein lightly. Then the atrial lead was selected. The patient was put in Trendelenburg position, and the left subclavian vein was entered on the first pass. After entering the subclavian vein, an introducer sheath was introduced on the top of the introduction wire, and a right atrial catheter was passed through the sheath and optimum  position was obtained at the appendage of the right atrium where stimulation thresholds were found to be satisfactory as described above, and the atrial lead was also tied to the pectoralis major muscle on the top of the suture sleeve. All connections were checked. Next, both leads were connected to the pacemaker adaptor manufactured by [employer]. All connections were tightened securely making sure the pace atrial lead goes into the atrial channel and the ventricular lead goes into the ventricular channel then the pocket was fashioned under the superficial fascia. All bleeding points were cauterized. And the pacemaker was put under the superficial fascia. The subcutaneous tissues and skin was sutured separately. There is no postoperative bleeding. All the positions were checked again for the pacemaker leads under fluoro, and the patient was sent in to the Recovery Room in a stable position. A postoperative chest x-ray will be obtained in the Recovery Room. Pacemaker was found to be capturing well in the Recovery Room. The family was appraised of the progress of the pacemaker insertion.   ____________________________ Cletis Athens, MD jm:jm D: 11/30/2012 16:43:15 ET T: 11/30/2012 17:05:24 ET JOB#: [id number]  cc: Cletis Athens, MD, <Dictator> Cletis Athens MD ELECTRONICALLY SIGNED 12/08/2012 13:40

## 2014-09-05 NOTE — Discharge Summary (Signed)
PATIENT NAME:  Michele Summers, Michele Summers MR#:  [id number] DATE OF BIRTH:  [birth year]  DATE OF ADMISSION:  11/27/2012 DATE OF DISCHARGE:  12/01/2012  REASON FOR ADMISSION: Hematuria.   FINAL DIAGNOSES: 1.  Persistent hematuria.  2.  Tachybrady syndrome.  3.  Malignant hypertension.  4.  Recurrent urinary tract infection.  5.  Hydroureter and hydronephrosis.  6.  Asthma.  7.  Chronic obstructive pulmonary disease.  8.  Anxiety.  9.  Hyperlipidemia.  10.  History of recent hip fracture.  11.  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  12.  History of supraventricular tachycardia.  13.  Status of PE or fat emboli, the patient is in Xarelto.   14.  Chronic anticoagulation now stopped.   DISPOSITION: Home.   MEDICATIONS AT DISCHARGE: Lipitor 10 mg once daily, alprazolam 0.25 mg daily, Singulair 10 mg daily, losartan 25 mg daily, fluoxetine 20 mg daily, zolpidem 10 mg daily, Advair Diskus Advair 250/50 one puff twice daily, Tylenol p.r.n. pain, potassium gluconate, 250 mg once a day and acetaminophen with hydrocodone 1 p.o. q. 6 hours, amlodipine 10 mg once a day, hydralazine 25 mg 4 times a day.   FOLLOW-UP: Dr. Cletis Athens for pacemaker care and related problems. Dr. Clayborn Bigness for medical management.   PROCEDURES: Surgeon, Dr. Cletis Athens, recent of the procedure, tachybrady syndrome with sick sinus syndrome. Procedure was permanent pacemaker placement.   LABORATORY DATA: BNP 755, glucose 96, creatinine 0.81. LFTs within normal limits hemoglobin 12.7, white count 6.6, platelet count 260. INR 1.3. Urinalysis shows 74 red blood cells, 2 white blood cells, no nitrites or leukocyte esterase.   HOSPITAL COURSE: Michele Summers is a very nice 81 year old female who has history of hypertension, previous PE, asthma, hyperlipidemia, hip fracture, recurrent urinary tract infections and CVA with right-sided weakness. The patient had hemiarthroplasty back in January. She is admitted due to persistent gross  hematuria. The patient reports having blood in urine for 2 to 3 weeks and had 1 to 2 visits with her primary care physician who prescribed some antibiotics. She was placed on Macrobid for prophylaxis for recurrent urinary tract infections. The blood continued to show up despite the fact that she was treated with antibiotics and started to feel weak. The patient was concerned about some left flank pain. In the Emergency Department her blood pressure was 223/90 her heart rate was all over the place in the 30s and then in the 100s and 110. She was admitted for evaluation of hematuria, but also for tachybrady syndrome. As far as problems go:  1.  Hematuria. The patient had persisting hematuria. She has been on Xarelto due to a previous pulmonary embolus . The happened over a year ago. She also has history of hemorrhagic CVA, 2006. But since the patient has been already treated for over a year, we thought it was okay to  stop it,  especially in the setting of the patient continued to bleed. This was checked with Dr. Clayborn Bigness and with Dr. Ida Rogue and they both agreed in stopping this medication.  The patient's  hematuria slowed down. The patient was put on continuous bladder irrigation, which was stopped after the urine cleared up and the patient was able to void. 2.  As far as her next problem, Tachybrady syndrome, her heart rate is in the 40s, then in the 100s, going back and forth.  The case was discussed with Dr. Ida Rogue, he recommended pacemaker. Dr. Cletis Athens was  contacted and he was kind enough to put the pacemaker in 11/30/2012 without any major complications. The patient actually was discharged the following day with follow-up in the office of Dr. [name].  3.  As far as other medical problems, the patient has malignant hypertension, her medications were addressed. Norvasc was increased and hydralazine added.  4.  As far as her other medical problems, they remained stable during this  hospitalization. The patient is discharged in good condition with general recommendations.   TIME SPENT:  I spent about 40 minutes with this discharge.    ____________________________ Moorhead Sink, MD rsg:cc D: 12/02/2012 07:04:24 ET T: 12/02/2012 14:51:19 ET JOB#: [id number]  cc:  Sink, MD, <Dictator> Adric Wrede America Brown MD ELECTRONICALLY SIGNED 12/07/2012 20:47

## 2014-09-05 NOTE — Consult Note (Signed)
General Aspect 81 year old female with a known history of PE on Xarelto, depression, anxiety and hip fracture status post right hip hemiarthroplasty in January 2011,  admitted for persistent gross hematuria. Cardiology was consulted for tach-brady syndrome.   She reports having bloody urine for about 2 to 3 weeks, saw her primary care physician who prescribed her for antibiotics. She continued having blood in the urine, now  feeling weak. she came to the Emergency Department. She was also having left flank pain for the last few days.   While in the ED, she was found to have a blood pressure of 223/90, tachybrady heart rate 30s to 110 or higher.   Present Illness . SOCIAL HISTORY: No smoking, alcohol or drug use.   FAMILY HISTORY: Mother died of stroke at the age of 14. Father died of chronic obstructive pulmonary disease at the age of 31.   Physical Exam:  GEN well developed   HEENT hearing intact to voice, moist oral mucosa   NECK supple   RESP normal resp effort  clear BS   CARD Regular rate and rhythm  Bradycardic  rate 40s to 50s at rest in bed   ABD denies tenderness  soft   LYMPH negative neck   EXTR negative edema   SKIN normal to palpation   NEURO motor/sensory function intact   PSYCH alert, agitated   Review of Systems:  Subjective/Chief Complaint weak, feels ok   General: Fatigue  Weakness   Skin: No Complaints   ENT: No Complaints   Eyes: No Complaints   Neck: No Complaints   Respiratory: No Complaints   Cardiovascular: No Complaints   Gastrointestinal: No Complaints   Genitourinary: No Complaints   Vascular: No Complaints   Musculoskeletal: No Complaints   Neurologic: No Complaints   Hematologic: No Complaints   Endocrine: No Complaints   Psychiatric: No Complaints   Review of Systems: All other systems were reviewed and found to be negative   Medications/Allergies Reviewed Medications/Allergies reviewed     Multi-drug Resistant  Organism (MDRO): Positive culture for MRSA., 09-Jun-2011   Speech affected by stroke:    Anemia:    CHF:    asthma:    CVA/Stroke: 2008   shingles: 2006   Pulmonary Embolus:    Hypertension:    Bronchitis:    O2 @ 2L/M at night PRN:    Gall Bladder Surgery:    Hysterectomy - Partial:    Craniotomy:        Admit Diagnosis:   GROSS HEMATURIA: Onset Date: 28-Nov-2012, Status: Active, Description: GROSS HEMATURIA  Home Medications: Medication Instructions Status  Lipitor 10 mg oral tablet 1 tab(s) orally once a day (at bedtime) Active  alprazolam 0.25 mg oral tablet 1 tab(s) orally once a day Active  Singulair 10 mg oral tablet 1 tab(s) orally once a day (at bedtime) Active  Xarelto 20 mg oral tablet 1 tab(s) orally once a day (in the morning) Active  losartan 25 mg oral tablet 1 tab(s) orally once a day Active  fluoxetine 20 mg oral capsule 1 cap(s) orally once a day Active  zolpidem 10 mg oral tablet 1 tab(s) orally once a day (at bedtime) Active  Advair Diskus 250 mcg-50 mcg inhalation powder 1 puff(s) inhaled 2 times a day Active  Tylenol 500 mg oral tablet 2 tab(s) orally every 6 hours, As Needed - for Pain Active  Norvasc 2.5 mg oral tablet 1 tab(s) orally once a day Active  potassium gluconate 250 milligram(s)  orally once a day Active  Vitamin B-12 500 mcg oral tablet 1 tab(s) orally once a day Active  acetaminophen-HYDROcodone 1 tab(s) (5/500 mg) orally every 6 hours, As Needed - for Pain Active   Lab Results:  Hepatic:  16-Jul-14 04:30   Bilirubin, Total  1.4  Alkaline Phosphatase 89  SGPT (ALT) 22  SGOT (AST) 29  Total Protein, Serum 6.8  Albumin, Serum 3.8  Routine Chem:  16-Jul-14 04:30   Glucose, Serum 99  BUN 17  Creatinine (comp) 1.27  Sodium, Serum 139  Potassium, Serum 4.0  Chloride, Serum 103  CO2, Serum 31  Calcium (Total), Serum 9.4  Osmolality (calc) 279  eGFR (African American)  47  eGFR (Non-African American)  40 (eGFR values  <69m/min/1.73 m2 may be an indication of chronic kidney disease (CKD). Calculated eGFR is useful in patients with stable renal function. The eGFR calculation will not be reliable in acutely ill patients when serum creatinine is changing rapidly. It is not useful in  patients on dialysis. The eGFR calculation may not be applicable to patients at the low and high extremes of body sizes, pregnant women, and vegetarians.)  Anion Gap  5  Routine Coag:  16-Jul-14 04:30   Prothrombin  16.0  INR 1.3 (INR reference interval applies to patients on anticoagulant therapy. A single INR therapeutic range for coumarins is not optimal for all indications; however, the suggested range for most indications is 2.0 - 3.0. Exceptions to the INR Reference Range may include: Prosthetic heart valves, acute myocardial infarction, prevention of myocardial infarction, and combinations of aspirin and anticoagulant. The need for a higher or lower target INR must be assessed individually. Reference: The Pharmacology and Management of the Vitamin K  antagonists: the seventh ACCP Conference on Antithrombotic and Thrombolytic Therapy. CWTUUE.2800Sept:126 (3suppl): [id number] A HCT value >55% may artifactually increase the PT.  In one study,  the increase was an average of 25%. Reference:  "Effect on Routine and Special Coagulation Testing Values of Citrate Anticoagulant Adjustment in Patients with High HCT Values." American Journal of Clinical Pathology 2006;126:400-405.)  Routine Hem:  16-Jul-14 04:30   WBC (CBC) 6.6  RBC (CBC) 4.83  Hemoglobin (CBC) 12.7  Hematocrit (CBC) 37.9  Platelet Count (CBC) 260  MCV  79  MCH 26.2  MCHC 33.4  RDW 14.0  Neutrophil % 68.2  Lymphocyte % 22.4  Monocyte % 5.9  Eosinophil % 2.5  Basophil % 1.0  Neutrophil # 4.5  Lymphocyte # 1.5  Monocyte # 0.4  Eosinophil # 0.2  Basophil # 0.1 (Result(s) reported on 28 Nov 2012 at 05:22AM.)   EKG:  Interpretation EKG shows  sinus bradycardia rate 43 bpm    Sulfa: Hives  Morphine: Hives  Bee Stings: Other, Swelling  Percocet 10/650: Unknown  Vital Signs/Nurse's Notes: **Vital Signs.:   16-Jul-14 06:22  Vital Signs Type Routine  Temperature Temperature (F) 97.8  Celsius 36.5  Temperature Source oral  Pulse Pulse 42  Respirations Respirations 15  Systolic BP Systolic BP 1349 Diastolic BP (mmHg) Diastolic BP (mmHg) 45  Mean BP 74  Pulse Ox % Pulse Ox % 94  Pulse Ox Activity Level  At rest  Oxygen Delivery Room Air/ 21 %  Pulse Ox Heart Rate 446   Impression 81year old female with a known history of PE on Xarelto, depression, anxiety and hip fracture status post right hip hemiarthroplasty in January 2011,  admitted for persistent gross hematuria. Cardiology was consulted for tach-brady syndrome.  1) Tachy-brady syndrome Patient reports she was perviously worked up for this,  Dr. Clayborn Bigness had previous mentioned she might need a pacer. Other than fatigue for the past 2 years since her hip surgery, she has felt well with no new changes. Rate steady in 40s at rest, improves with exertion. No notable heart block. She denies dizziness. She had had short runs of tachycardia, possible reentrant rhythm. asymptomatic. --Would monitor for now, no plan for pacer as she has no sx. Would not start rate control or rhythm control meds as this could make bradycardia worse. --If she has worsening tachycardia, may need a pacer so that meds can be started   Plan . 2) gross hematuria  for the last 2 to 3 weeks likely due to Xarelto,   Dr. Jacqlyn Larsen following xarelto in hold  3. Malignant hypertension.   improved on current meds. Would monitor, could increase hydralazine if needed   4. Recurrent urinary tract infection  on oral Macrobid for prophylaxis   5. Asthma/chronic obstructive pulmonary disease.   continue  home inhalers.  7. Anxiety.  on alprazolam.   8. h/o PE  bilateral SCDs and TEDs for now    Electronic Signatures: Ida Rogue (MD)  (Signed 16-Jul-14 09:58)  Authored: General Aspect/Present Illness, History and Physical Exam, Review of System, Past Medical History, Health Issues, Home Medications, Labs, EKG , Radiology, Allergies, Vital Signs/Nurse's Notes, Impression/Plan   Last Updated: 16-Jul-14 09:58 by Ida Rogue (MD)

## 2014-09-07 NOTE — Op Note (Signed)
PATIENT NAME:  Michele Summers, Michele Summers MR#:  [id number] DATE OF BIRTH:  01/25/1932  DATE OF PROCEDURE:  05/24/2011  PREOPERATIVE DIAGNOSIS: Bladder neoplasm, uncertain.   POSTOPERATIVE DIAGNOSIS: Bladder neoplasm, uncertain.   PROCEDURE: Cystoscopy, bladder biopsy.   SURGEON: Denice Bors. Jacqlyn Larsen, MD  ANESTHESIA: Laryngeal mask airway anesthesia.   INDICATIONS: The patient is a 81 year old white female with a long history of recurring bladder infections. She has undergone previous pubovaginal sling. She has developed significant irritative voiding symptoms. Recent cystoscopy demonstrated multiple areas of erythema and mucosal abnormality within the bladder. She was placed on a course of suppression antibiotic with persistence of the abnormal areas. She presents for cystoscopy, bladder biopsy.   DESCRIPTION OF PROCEDURE: After informed consent was obtained, the patient was taken to the Operating Room and placed in the dorsal lithotomy position under laryngeal mask airway anesthesia. The patient was then prepped and draped in the usual standard fashion. The 54 French rigid cystoscope was introduced into the urethra under direct vision with no urethral abnormalities noted. Upon entering the bladder, the mucosa was inspected in its entirety. Multiple areas of punctate mucosal abnormalities were noted throughout the bladder consistent with cystitis cystica. A predominance of raised, erythematous mucosa was noted on the bladder base, left lateral wall and an area on the posterior bladder wall. These are suspicious for possible carcinoma in situ. Cold cup biopsy forceps were utilized to obtain biopsies from the bladder base, the posterior wall and the left lateral wall. The areas were then cauterized utilizing a Bugbee electrode. No significant bleeding was encountered. Upon completion of the procedure the bladder was drained. An 68 French red rubber catheter was inserted into the urinary bladder. 40 mL of 2% lidocaine was  instilled into the urinary bladder. The catheter was then removed. The patient was then awakened from laryngeal mask airway anesthesia. She was taken to the recovery room in stable condition. There were no problems or complications.     Patient tolerated the procedure well.   ____________________________ Denice Bors. Jacqlyn Larsen, MD bsc:cms D: 05/24/2011 21:43:00 ET T: 05/25/2011 08:38:56 ET JOB#: [id number]  cc: Denice Bors. Jacqlyn Larsen, MD, <Dictator>  Denice Bors Jeani Fassnacht MD ELECTRONICALLY SIGNED 05/31/2011 7:10

## 2014-09-07 NOTE — Consult Note (Signed)
Chief Complaint:   Subjective/Chief Complaint Resting quietly. No cp still has some sob .   VITAL SIGNS/ANCILLARY NOTES: **Vital Signs.:   27-Jan-13 10:00   Vital Signs Type Routine   Pulse Pulse 82   Pulse source per cardiac monitor   Respirations Respirations 21   Systolic BP Systolic BP 209   Diastolic BP (mmHg) Diastolic BP (mmHg) 50   Mean BP 75   BP Source non-invasive   Pulse Ox % Pulse Ox % 96   Pulse Ox Activity Level  At rest   Oxygen Delivery 100%; Non Rebreather Mask   Pulse Ox Heart Rate 80  *Intake and Output.:   27-Jan-13 12:34   Grand Totals Intake:  85 Output:      Net:  85 24 Hr.:  270   Oral Intake      In:  60   IV (Primary)      In:  25   Brief Assessment:   Cardiac Irregular  murmur present  -- LE edema  -- JVD    Respiratory normal resp effort  clear BS    Gastrointestinal Normal    Gastrointestinal details normal Soft  Nontender  Nondistended  No masses palpable    Additional Physical Exam Leg immobilized   Routine Chem:  27-Jan-13 00:39    Glucose, Serum 119   BUN 17   Creatinine (comp) 1.26   Sodium, Serum 139   Potassium, Serum 3.9   Chloride, Serum 100   CO2, Serum 25   Calcium (Total), Serum 8.5   Osmolality (calc) 280   eGFR (African American) 53   eGFR (Non-African American) 44   Anion Gap 14  Routine Hem:  27-Jan-13 00:39    Hemoglobin (CBC) 10.5  Cardiac:  27-Jan-13 00:39    Troponin I 0.25    07:51    Troponin I 0.33   Radiology Results: XRay:    24-Jan-13 17:12, Chest 1 View AP or PA   Chest 1 View AP or PA    REASON FOR EXAM:    fall  COMMENTS:       PROCEDURE: DXR - DXR CHEST 1 VIEWAP OR PA  - Jun 09 2011  5:12PM     RESULT: Right lower lobe bibasilar infiltrates are present consistent   with pneumonia. The cardiovascular structures are unremarkable.     IMPRESSION:      Bibasilar infiltrates noted consistent with pneumonia.    Thank you for the opportunity to contribute to the care of your patient.          Verified By: Osa Craver, M.D., MD    24-Jan-13 17:12, Hip Right Complete   Hip Right Complete    REASON FOR EXAM:    fall injury  COMMENTS:       PROCEDURE: DXR - DXR HIP RIGHT COMPLETE  - Jun 09 2011  5:12PM     RESULT: Right femoral neck fracture is present. The fracture is   angulated.     IMPRESSION:     Angulated right femoral neck fracture.    Thank you for the opportunity to contribute to the care of your patient.         Verified By: Osa Craver, M.D., MD    24-Jan-13 17:12, Pelvis AP Only   Pelvis AP Only    REASON FOR EXAM:    fall injury  COMMENTS:       PROCEDURE: DXR - DXR PELVIS AP ONLY  - Jun 09 2011  5:12PM     RESULT: Right femoral neck fracture is present. Angulation deformity is   present. The left hip is intact.    IMPRESSION:      Right femoral neck fracture with angulation deformity.    Thank you for the opportunity to contribute to the care of your patient.         Verified By: Osa Craver, M.D., MD    25-Jan-13 15:05, Hip Right One View   Hip Right One View    REASON FOR EXAM:    post op  COMMENTS:   Bedside (portable):Y    PROCEDURE: DXR - DXR HIP RIGHT ONE VIEW  - Jun 10 2011  3:05PM     RESULT: The patient is status post right hip arthroplasty. Surgical   drains and skin staples are present. No immediate postoperative bone or   hardware complication is evident.    IMPRESSION:  Please see above.          Verified By: Sundra Aland, M.D., MD    25-Jan-13 15:05, Pelvis AP Only   Pelvis AP Only    REASON FOR EXAM:    post op in PACU  COMMENTS:   Bedside (portable):Y    PROCEDURE: DXR - DXR PELVIS AP ONLY  - Jun 10 2011  3:05PM     RESULT: AP view the pelvis shows a patient status post right hip   arthroplasty. Surgical drains are present. There is no evidence of an   immediate postoperative bone or hardware complication.    IMPRESSION:   1. Please see above.          Verified By:  Sundra Aland, M.D., MD    26-Jan-13 11:25, Chest Portable Single View   Chest Portable Single View    REASON FOR EXAM:    O2 sats dropping, Heart Rate increasing  COMMENTS:       PROCEDURE: DXR - DXR PORTABLE CHEST SINGLE VIEW  - Jun 11 2011 11:25AM     RESULT: Comparison is made to a prior study dated 06/09/2011.    The patient has taken a shallow inspiration. There is thickening of the   interstitial markings and peribronchial cuffing. Areas of increased   density project within the right and left lung bases. The cardiac   silhouette is enlarged indicative of cardiomegaly. The aorta is tortuous   and ectatic. The visualized bony skeleton is unremarkable.    IMPRESSION:    1. Shallow inspiration.  2. Atelectasis versus infiltrate within the lung bases as well possibly a   small effusion on the left.   3. There are also findings likely representing a component of underlying   pulmonary edema.      Thank you for the opportunity to contribute to the care of your patient.           Verified By: Mikki Santee, M.D., MD  Lab:    26-Jan-13 11:20, ABG   pH (ABG) 7.36   PCO2 46   PO2 66   FiO2 100   Base Excess 0.2   HCO3 26.0   O2 Saturation 91.9   O2 Device nrb   Specimen Site (ABG)    RT RADIAL   Specimen Type (ABG) ARTERIAL   Patient Temp (ABG) 37.0   Result(s) reported on 11 Jun 2011 at 11:25AM.    26-Jan-13 14:40, ABG   pH (ABG) 7.35   PCO2 47   PO2 68   FiO2 100   Base  Excess -0.1   HCO3 25.9   O2 Saturation 92.3   O2 Device nrb   Specimen Site (ABG)    RT RADIAL   Specimen Type (ABG) ARTERIAL   Patient Temp (ABG) 37.0   Result(s) reported on 11 Jun 2011 at 02:50PM.  Cardiology:    24-Jan-13 17:22, ED ECG   Ventricular Rate 59   Atrial Rate 59   P-R Interval 166   QRS Duration 84   QT 446   QTc 441   P Axis 59   R Axis 24   T Axis 55   ECG interpretation    Sinus bradycardia  Nonspecific ST abnormality  Abnormal ECG  When compared with  ECG of 30-Dec-2010 10:15,  No significant change was found  ----------unconfirmed----------  Confirmed by OVERREAD, NOT ([id number]), editor PEARSON, BARBARA ([id number]) on 06/10/2011 8:08:43 AM   ED ECG   CT:    26-Jan-13 15:09, CT Chest for Pulm Embolism With Contrast   CT Chest for Pulm Embolism With Contrast    REASON FOR EXAM:    Acute REsp. Failure. Shortness of breath.  COMMENTS:       PROCEDURE: CT  - CT CHEST (FOR PE) W  - Jun 11 2011  3:09PM     RESULT:     Technique: Helical 3 mm sections were obtained from the thoracic inlet   through the lung basesstatus post intravenous administration of 100 mL   of Isovue-370.    Findings:    The mediastinum and hilar regions and structures demonstrates   multichamber cardiac enlargement.  Filling defects are appreciated within segmental branches of the right   lower lobe, subsegmental branches of the right middle lobe, as well as   segmental branches of the left upper lobe and lingular regions.    Small bilateral effusions are identified. Ill-defined areas of increased   density project within the rightupper lobe, left lower lobe and lingular   regions. There is also diffuse ground-glass density within the right and   left upper lobes.     The visualized upper abdominal viscera demonstrate no gross   abnormalities. There is no evidence of an abdominal aortic aneurysm.    IMPRESSION:      1. Segmental and subsegmental pulmonary arterial filling defects as     described above consistent with embolic disease.   2. An underlying interstitial infiltrate is appreciated a component of   which may represent pulmonary edema though an infectious component cannot   be excluded.  3. Regions of atelectasis versus infiltrates within the lung bases.   4. Bilateral effusions.      Thank you for the opportunity to contribute to the care of your patient.               Verified By: Mikki Santee, M.D., MD    26-Jan-13 22:28, CT Head Without  Contrast   CT Head Without Contrast    REASON FOR EXAM:    patient fell on head now with MS changes and PE  COMMENTS:       PROCEDURE: CT  - CT HEAD WITHOUT CONTRAST  - Jun 11 2011 10:28PM     RESULT: Comparison is made to prior study dated 11/14/2008.    Technique: Helical noncontrasted 5 mm sections were obtained from the   skull base to the vertex.    Findings: An area of encephalomalacic change projects within the right   frontal region indicative of an area of chronic MCA distribution  infarction. No further intra-axial or extra-axial fluid collections are   identified nor evidence of acute hemorrhage. The ventricles and cisterns   are patent. There is no evidence of mass effect. There is no evidence of     a depressed skull fracture. Postsurgical change is identified within the   right temporal region.    IMPRESSION:      1. No evidence of acute intracranial abnormalities.  2. Chronic changes.     Thank you for the opportunity to contribute to the care of your patient.           Verified By: Mikki Santee, M.D., MD   Assessment/Plan:  Assessment/Plan:   Assessment IMP Tachycardia PE DVT Hx Fx CHF Hx CVA Hypoxemia HTN Hyperlipidemia UTI .    Plan PLAN B-blockers for tachycardia 02 for hypoxemia ICU care  Anticoug with Lovenox Agree with ECHO Vascular surgery consult Rec IVC filter Antibx for UTI   Electronic Signatures: Yolonda Kida (MD)  (Signed 27-Jan-13 23:46)  Authored: Chief Complaint, VITAL SIGNS/ANCILLARY NOTES, Brief Assessment, Lab Results, Radiology Results, Assessment/Plan   Last Updated: 27-Jan-13 23:46 by Lujean Amel D (MD)

## 2014-09-07 NOTE — H&P (Signed)
Subjective/Chief Complaint Pain right hip    History of Present Illness 81 year old female tripped over a cement parking curb last night injuring the right hip. Brought to Emergency Room where exam and X-rays show a displaced subcapital hip fracture.  Admitted for medical clearance and surgery.  Discussed treatment with patient and family who agree with hemiarthroplasty. Risks and benefits of surgery were discussed at length including but not limited to infection, non union, nerve or blood vessed damage, non union, need for repeat surgery, blood clots and lung emboli, and death.  Will proceed later this am.   Past Med/Surgical Hx:  Speech affected by stroke:   Anemia:   CHF:   asthma:   CVA/Stroke:   shingles:   Pulmonary Embolus:   Hypertension:   Bronchitis:   O2 @ 2L/M at night PRN:   Gall Bladder Surgery:   Hysterectomy - Partial:   Craniotomy:   ALLERGIES:  Sulfa: Hives  Morphine: Hives  Bee Stings: Other, Swelling  HOME MEDICATIONS:  amlodipine 2.5 mg oral tablet: 1 tab(s) orally once a day (at bedtime), Active  zolpidem 10 mg oral tablet: 1 tab(s) orally once a day (at bedtime), Active  Avalide 300 mg-12.5 mg oral tablet: 1 tab(s) orally once a day, Active  Singulair 10 mg oral tablet: 1 tab(s) orally once a day, Active  Lipitor 10 mg oral tablet: 1 tab(s) orally once a day, Active  Sanctura XR 60 mg oral capsule, extended release: 1 cap(s) orally once a day, Active  Bayer Aspirin 325 mg oral tablet: 1 tab(s) orally once a day, Active  albuterol CFC free 90 mcg/inh inhalation aerosol: 2 puff(s) inhaled once a day as needed, Active  fluoxetine 20 mg oral capsule: 1 cap(s) orally once a day, Active  Advair Diskus 250 mcg-50 mcg inhalation powder: 1 puff(s) inhaled 2 times a day as needed, Active  Aleve Caplet 220 mg oral tablet: 1 tab(s) orally once a day as needed., Active  alprazolam 0.25 mg oral tablet: 1 tab(s) orally twice daily as needed., Active  EpiPen  Auto-Injector 0.3 mg injectable kit: inject as directed as needed., Active  Family and Social History:   Family History Non-Contributory    Social History negative tobacco    Place of Living Home   Review of Systems:   Fever/Chills No    Cough No    Sputum No    Abdominal Pain No   Physical Exam:   GEN WD, WN, NAD    HEENT pink conjunctivae    NECK supple    RESP normal resp effort    CARD regular rate    ABD denies tenderness    GU foley catheter in place    LYMPH negative neck    EXTR negative edema, Right leg painful to range of motion at hip.   circulation/sensation/motor function good.  short and rotated when out of traction. skin intact    SKIN normal to palpation    NEURO motor/sensory function intact    PSYCH alert, A+O to time, place, person   Routine Coag:  24-Jan-13 16:32    Activated PTT (APTT) 27.5  Routine Chem:  24-Jan-13 16:32    Glucose, Serum 130   BUN 12   Creatinine (comp) 0.98   Sodium, Serum 140   Potassium, Serum 3.5   Chloride, Serum 101   CO2, Serum 27   Calcium (Total), Serum 8.9  Hepatic:  24-Jan-13 16:32    Bilirubin, Total 0.7   Alkaline  Phosphatase 74   SGPT (ALT) 14   SGOT (AST) 25   Total Protein, Serum 7.2   Albumin, Serum 4.1  Routine Chem:  24-Jan-13 16:32    Osmolality (calc) 281   eGFR (African American) >60   eGFR (Non-African American) 58   Anion Gap 12  Routine Hem:  24-Jan-13 16:32    WBC (CBC) 8.6   RBC (CBC) 4.88   Hemoglobin (CBC) 12.9   Hematocrit (CBC) 39.0   Platelet Count (CBC) 360   MCV 80   MCH 26.5   MCHC 33.1   RDW 13.3  Routine Coag:  24-Jan-13 16:32    Prothrombin 13.4   INR 1.0  Cardiac:  24-Jan-13 16:32    Troponin I < 0.02  Routine BB:  24-Jan-13 16:32    Antibody Screen NEGATIVE   Crossmatch Unit 1 Ready   Crossmatch Unit 2 Ready  Cardiology:  24-Jan-13 17:22    Ventricular Rate 59   Atrial Rate 59   P-R Interval 166   QRS Duration 84   QT 446   QTc 441   P  Axis 59   R Axis 24   T Axis 55  Routine UA:  24-Jan-13 18:06    Color (UA) Yellow   Clarity (UA) Cloudy   Glucose (UA) Negative   Bilirubin (UA) Negative   Ketones (UA) Negative   Specific Gravity (UA) 1.013   Blood (UA) 2+   pH (UA) 5.0   Protein (UA) Negative   Nitrite (UA) Negative   Leukocyte Esterase (UA) 3+   RBC (UA) 31 /HPF   WBC (UA) 176 /HPF   Bacteria (UA) TRACE   Mucous (UA) PRESENT  Routine Chem:  25-Jan-13 03:19    Glucose, Serum 129   BUN 13   Creatinine (comp) 0.95   Sodium, Serum 139   Potassium, Serum 4.4   Chloride, Serum 104   CO2, Serum 26   Calcium (Total), Serum 8.4   Osmolality (calc) 279   eGFR (African American) >60   eGFR (Non-African American) >60   Anion Gap 9  Routine Hem:  25-Jan-13 03:19    WBC (CBC) 8.7   RBC (CBC) 4.31   Hemoglobin (CBC) 11.4   Hematocrit (CBC) 34.3   Platelet Count (CBC) 269   MCV 80   MCH 26.3   MCHC 33.0   RDW 13.9   Neutrophil % 83.4   Lymphocyte % 10.3   Monocyte % 3.6   Eosinophil % 2.4   Basophil % 0.3   Neutrophil # 7.2   Lymphocyte # 0.9   Monocyte # 0.3   Eosinophil # 0.2   Basophil # 0.0  Routine Chem:  25-Jan-13 03:22    Hemoglobin A1c [facility]) 5.5   Radiology Results: XRay:    24-Jan-13 17:12, Hip Right Complete   Hip Right Complete   REASON FOR EXAM:    fall injury  COMMENTS:       PROCEDURE: DXR - DXR HIP RIGHT COMPLETE  - Jun 09 2011  5:12PM     RESULT: Right femoral neck fracture is present. The fracture is   angulated.     IMPRESSION:     Angulated right femoral neck fracture.    Thank you for the opportunity to contribute to the care of your patient.         Verified By: Osa Craver, M.D., MD    24-Jan-13 17:12, Pelvis AP Only   Pelvis AP Only   REASON FOR EXAM:  fall injury  COMMENTS:       PROCEDURE: DXR - DXR PELVIS AP ONLY  - Jun 09 2011  5:12PM     RESULT: Right femoral neck fracture is present. Angulation deformity is   present. The left hip is  intact.    IMPRESSION:      Right femoral neck fracture with angulation deformity.    Thank you for the opportunity to contribute to the care of your patient.         Verified By: Osa Craver, M.D., MD     Assessment/Admission Diagnosis Displaced right subcapital hip fracture. history of pulmonary embolism    Plan right hip hemiarthroplasty coumadin post op   Electronic Signatures: Park Breed (MD)  (Signed 25-Jan-13 10:57)  Authored: CHIEF COMPLAINT and HISTORY, PAST MEDICAL/SURGIAL HISTORY, ALLERGIES, HOME MEDICATIONS, FAMILY AND SOCIAL HISTORY, REVIEW OF SYSTEMS, PHYSICAL EXAM, LABS, Radiology, ASSESSMENT AND PLAN   Last Updated: 25-Jan-13 10:57 by Park Breed (MD)

## 2014-09-07 NOTE — Consult Note (Signed)
PATIENT NAME:  Michele Summers, Michele Summers MR#:  [id number] DATE OF BIRTH:  Feb 09, 1932  DATE OF CONSULTATION:  06/09/2011  REFERRING PHYSICIAN:  Earnestine Leys, MD  CONSULTING PHYSICIAN:  Tana Conch. Leslye Peer, MD  PRIMARY CARE PHYSICIAN: Clayborn Bigness, MD   REASON FOR CONSULTATION: Preop and postoperative management of hip fracture.   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 She has also had a history of pulmonary embolism and hypertension. She does wear oxygen at night, 2 liters nasal cannula. She does have asthma. She was going to her car and she fell off the sidewalk and tripped. She had a fall and landed on her back. No loss of consciousness. She is in a lot of pain in her right hip. She heard a pop. She di fall backwards and did hit her head. In the ER, she was found to have a right hip fracture. Hospitalist services were contacted for further evaluation.   PAST MEDICAL HISTORY:  1. Cerebrovascular accident with hemorrhage with right-sided weakness and speaking abnormality.  2. History of pulmonary embolism.  3. Hypertension.  4. Hyperlipidemia.  5. Depression.  PAST SURGICAL HISTORY:  1. Cholecystectomy.  2. Cystoscopes by Dr. Jacqlyn Larsen. 3. Hysterectomy. 4. Brain surgery secondary to bleed.   ALLERGIES: Sulfa and morphine.   MEDICATIONS AS PER PHARMACY TECH: 1. Advair Diskus 250/50 1 inhalation twice a day.  2. Albuterol p.r.n.  3. Aleve p.r.n.  4. Alprazolam 0.25 mg twice a day as needed.  5. Amlodipine 2.5 mg daily.  6. Avalide 300/12.5 one tablet daily. 7. Bayer aspirin 325 mg daily.  8. Fluoxetine 20 mg daily.  9. Lipitor 10 mg at bedtime.  10. Sanctura XR 60 mg daily.  11. Singulair 10 mg at bedtime.  12. Ambien 10 mg at bedtime.   SOCIAL HISTORY: No smoking. No alcohol. No drug use. Lives alone. 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   FAMILY HISTORY:  Mother died at 9, possible CVA. Father died at 30 of COPD.   REVIEW OF SYSTEMS: CONSTITUTIONAL: Positive for right-sided weakness, baseline from CVA. No fever, chills, or sweats. No weight loss. No weight gain. EYES: She does wear glasses. EARS, NOSE, MOUTH, AND THROAT: No hearing loss. No sore throat. No difficulty swallowing. CARDIOVASCULAR: No chest pain. Some palpitations with a slow heart rate. RESPIRATORY: Occasional shortness of breath, none right now. No cough. No sputum. No hemoptysis. GASTROINTESTINAL: No nausea. No vomiting. Occasional abdominal pain. No diarrhea, no constipation, no bright red blood per rectum. No melena. GENITOURINARY: Positive for burning on urination. No hematuria. Positive for vaginal itching. MUSCULOSKELETAL: Positive for right hip pain. NEUROLOGIC: Positive for dizziness on Friday. PSYCHIATRIC: Positive for anxiety and depression. ENDOCRINE: No thyroid problems. HEMATOLOGIC: No anemia.   PHYSICAL EXAMINATION:   VITAL SIGNS: Temperature 97, pulse 58, respirations 22, blood pressure 180/67, pulse oximetry 97%.   GENERAL: No respiratory distress.   EYES: Conjunctivae and lids normal. Pupils equal, round, and reactive to light. Extraocular muscles intact. No nystagmus.   EARS, NOSE, MOUTH, AND THROAT: Tympanic membrane no erythema. Nasal mucosa no erythema. Throat no erythema. No exudate seen. Lips and gums normal.   NECK: No JVD. No bruits. No lymphadenopathy. No thyromegaly. No thyroid nodules palpated.   RESPIRATORY: Lungs clear to auscultation. No use of accessory muscles to breathe. No rhonchi, rales, or wheeze heard.   CARDIOVASCULAR: S1, S2 normal. No gallops,  rubs, or murmurs heard. Carotid upstroke 2+ bilaterally. No bruits. Dorsalis pedis pulses 2+ bilaterally. No edema of the lower extremity.   ABDOMEN: Soft, nontender. No organomegaly/splenomegaly. Normoactive bowel sounds. No masses felt.   LYMPHATIC: No lymph nodes in the neck.   MUSCULOSKELETAL:  No clubbing, edema, or cyanosis. Right leg shortened and externally rotated.   SKIN: No lesions or ulcers seen.   NEUROLOGIC: Cranial nerves II through XII grossly intact. Deep tendon reflexes not tested on right lower extremity secondary to fracture. Left lower extremity 1+. The patient does stutter when speaking.   PSYCHIATRIC: The patient is oriented to person, place, and time.    LABORATORY AND RADIOLOGICAL DATA: Urinalysis 2+ blood, 3+ leukocyte esterase. Chest x-ray read as bibasilar infiltrates consistent with pneumonia. Right hip shows angulated right femoral neck fracture. Glucose 130, BUN 12, creatinine 0.98, sodium 140, potassium 3.5, chloride 101, CO2 27, calcium 8.9. Liver function tests normal. White blood cell count 8.6, hemoglobin and hematocrit 12.9 and 39.0, platelet count 360. INR 1.0. Troponin negative. EKG sinus bradycardia, 59 beats per minute, no acute ST-T wave changes.   ASSESSMENT AND PLAN:  1. Preop consultation. No contraindications to surgery at this time. Beta-blocker contraindicated with bradycardia. The patient is moderate risk secondary to medical problems. Surgery must be done in order to walk again. I do not think this patient clinically has pneumonia. No white count. No fever. No cough. Looking back at old chest x-rays, they do comment on fibrosis. The patient has had an old CT scan of the chest that showed atelectasis versus infiltrate on the lower lobes so this may be chronic.  2. Urinary tract infection. Will give IV Levaquin stat and daily. Will send off a urine culture.  3. Asthma. Respiratory status is stable. Continue Advair and albuterol.  4. History of hemorrhagic CVA status post brain surgery. The patient is on aspirin daily full dose so bleeding risk at this point is probably low.  5. Hyperlipidemia. Continue Lipitor.  6. Hypertension. Blood pressure probably elevated secondary to pain. Continue following up usual medications.  7. Did hit head with a  fall. Will get a CT scan of the head.  8. Impaired fasting glucose. Will check a hemoglobin A1c.  9. Hypokalemia. Will replace potassium.   TIME SPENT ON CONSULT: 55 minutes.   ____________________________ Tana Conch. Leslye Peer, MD rjw:drc D: 06/09/2011 19:17:01 ET T: 06/10/2011 07:37:01 ET JOB#: [id number]  cc: Tana Conch. Leslye Peer, MD, <Dictator> [name], MD Park Breed, MD Marisue Brooklyn MD ELECTRONICALLY SIGNED 06/24/2011 16:17

## 2014-09-07 NOTE — Consult Note (Signed)
General Aspect Bilateral PE    Present Illness The patient is a 81 year old female who tripped over a cement parking curb two days ago injuring the right hip.  X-rays showed a displaced subcapital hip fracture. She was taken to the OR yesterday for repair of the right hip which was successful.  Today she had an abrupt decompensation of her pulmonary stuts and was transfered to the ICU.  She is now on a 100% nonrebreather.  CT was done which shows bilateral PE and Lovenox has been initiated.  The CT also shows probable pneumonia and bilateral atelechtasis.  She has a history of DVT and PE 6 years ago surrounding a crainiotomy surgery for uncertain reasons.  She did not have a hemorrhagic stroke per the family and the event appears to have been coincident with the crainiotomy.  Past Medical History:               Anemia:   CHF:   asthma:   CVA/Stroke:   shingles:   Pulmonary Embolus:   Hypertension:   Bronchitis:   O2 @ 2L/M at night PRN:   Gall Bladder Surgery:   Hysterectomy - Partial:   Craniotomy:   Home Medications:  amlodipine 2.5 mg oral tablet: 1 tab(s) orally once a day (at bedtime), Active  zolpidem 10 mg oral tablet: 1 tab(s) orally once a day (at bedtime), Active  Avalide 300 mg-12.5 mg oral tablet: 1 tab(s) orally once a day, Active  Singulair 10 mg oral tablet: 1 tab(s) orally once a day, Active  Lipitor 10 mg oral tablet: 1 tab(s) orally once a day, Active  Sanctura XR 60 mg oral capsule, extended release: 1 cap(s) orally once a day, Active  Bayer Aspirin 325 mg oral tablet: 1 tab(s) orally once a day, Active  albuterol CFC free 90 mcg/inh inhalation aerosol: 2 puff(s) inhaled once a day as needed, Active  fluoxetine 20 mg oral capsule: 1 cap(s) orally once a day, Active  Advair Diskus 250 mcg-50 mcg inhalation powder: 1 puff(s) inhaled 2 times a day as needed, Active  Aleve Caplet 220 mg oral tablet: 1 tab(s) orally once a day as needed., Active  alprazolam 0.25 mg oral  tablet: 1 tab(s) orally twice daily as needed., Active  EpiPen Auto-Injector 0.3 mg injectable kit: inject as directed as needed., Active   Sulfa: Hives  Morphine: Hives  Bee Stings: Other, Swelling  Norco: Other  Case History:   Family History Non-Contributory    Social History negative tobacco, negative ETOH, negative Illicit drugs   Review of Systems:   ROS Pt not able to provide ROS  aphasia and sortness fo breath    Medications/Allergies Reviewed Medications/Allergies reviewed   Physical Exam:   GEN WD, critically ill appearing    HEENT pink conjunctivae, PERRL, dry oral mucosa    NECK supple  No masses  trachea midline    RESP normal resp effort  no use of accessory muscles    CARD regular rate  murmur present  no JVD    ABD denies tenderness  denies Flank Tenderness  soft    GU foley catheter in place  clear yellow urine draining    EXTR negative cyanosis/clubbing, negative edema    SKIN No rashes, No ulcers, skin turgor poor    NEURO follows commands, aphasic    PSYCH alert, poor insight   Nursing/Ancillary Notes: **Vital Signs.:   27-Jan-13 13:00   Vital Signs Type Routine   Pulse  Pulse 64   Respirations Respirations 25   Systolic BP Systolic BP 98   Diastolic BP (mmHg) Diastolic BP (mmHg) 44   Mean BP 62   BP Source non-invasive   Pulse Ox % Pulse Ox % 94   Pulse Ox Activity Level  At rest   Oxygen Delivery 100%; Non Rebreather Mask   Pulse Ox Heart Rate 64   Routine Chem:  26-Jan-13 04:48    Glucose, Serum 119   BUN 14   Creatinine (comp) 1.08   Sodium, Serum 139   Potassium, Serum 4.4   Chloride, Serum 104   CO2, Serum 27   Calcium (Total), Serum 8.0   Osmolality (calc) 279   eGFR (African American) >60   eGFR (Non-African American) 52   Anion Gap 8  Routine Hem:  26-Jan-13 04:48    WBC (CBC) 8.2   RBC (CBC) 3.74   Hemoglobin (CBC) 9.9   Hematocrit (CBC) 30.4   Platelet Count (CBC) 187   MCV 81   MCH 26.3   MCHC 32.5   RDW  13.5   Neutrophil % 81.7   Lymphocyte % 7.9   Monocyte % 4.5   Eosinophil % 5.8   Basophil % 0.1   Neutrophil # 6.7   Lymphocyte # 0.6   Monocyte # 0.4   Eosinophil # 0.5   Basophil # 0.0  Lab:  26-Jan-13 11:20    pH (ABG) 7.36   PCO2 46   PO2 66   FiO2 100   Base Excess 0.2   HCO3 26.0   O2 Saturation 91.9   O2 Device nrb   Specimen Type (ABG) ARTERIAL   Patient Temp (ABG) 37.0    14:40    pH (ABG) 7.35   PCO2 47   PO2 68   FiO2 100   Base Excess -0.1   HCO3 25.9   O2 Saturation 92.3   O2 Device nrb   Specimen Type (ABG) ARTERIAL   Patient Temp (ABG) 37.0  Routine Chem:  26-Jan-13 15:24    Glucose, Serum 130   BUN 16   Creatinine (comp) 1.31   Sodium, Serum 136   Potassium, Serum 4.3   Chloride, Serum 100   CO2, Serum 27   Calcium (Total), Serum 8.0  Hepatic:  26-Jan-13 15:24    Bilirubin, Total 1.0   Alkaline Phosphatase 51   SGPT (ALT) 11   SGOT (AST) 29   Total Protein, Serum 5.4   Albumin, Serum 2.5  Routine Chem:  26-Jan-13 15:24    Osmolality (calc) 275   eGFR (African American) 51   eGFR (Non-African American) 42   Anion Gap 9  Cardiac:  26-Jan-13 15:24    Troponin I 0.19   CK, Total 380   CPK-MB, Serum 3.2   Radiology Results: CT:    26-Jan-13 15:09, CT Chest for Pulm Embolism With Contrast   CT Chest for Pulm Embolism With Contrast    REASON FOR EXAM:    Acute REsp. Failure. Shortness of breath.  COMMENTS:       PROCEDURE: CT  - CT CHEST (FOR PE) W  - Jun 11 2011  3:09PM     RESULT:     Technique: Helical 3 mm sections were obtained from the thoracic inlet   through the lung basesstatus post intravenous administration of 100 mL   of Isovue-370.    Findings:    The mediastinum and hilar regions and structures demonstrates   multichamber cardiac enlargement.  Filling  defects are appreciated within segmental branches of the right   lower lobe, subsegmental branches of the right middle lobe, as well as   segmental branches of  the left upper lobe and lingular regions.    Small bilateral effusions are identified. Ill-defined areas of increased   density project within the rightupper lobe, left lower lobe and lingular   regions. There is also diffuse ground-glass density within the right and   left upper lobes.     The visualized upper abdominal viscera demonstrate no gross   abnormalities. There is no evidence of an abdominal aortic aneurysm.    IMPRESSION:      1. Segmental and subsegmental pulmonary arterial filling defects as     described above consistent with embolic disease.   2. An underlying interstitial infiltrate is appreciated a component of   which may represent pulmonary edema though an infectious component cannot   be excluded.  3. Regions of atelectasis versus infiltrates within the lung bases.   4. Bilateral effusions.      Thank you for the opportunity to contribute to the care of your patient.               Verified By: Mikki Santee, M.D., MD     Impression 1.  Acute PE         currently patient is appropriately anticoagulated.         I recommend placement of an IVC filter given her severe pulmonary disease, home O2, and the abrupt episode with no antecedent symptoms. The risks benefits and alternatives were reviewed with the patient and family all questions were answered.  Patient and family agree to proceed. I do not feel there is any contraindication to coumadin since the history of CVA (which appears to be nonhemorrhagic) was over 6 years ago 2.  Pneumonia         patient started on ABx 3.  Respiratory failure          continue supplemental O2          plan per medical service 4.  Fractured right hip          plan per ortho 5.  Hypertension          hold home medications for now as the patient is hypotensive          resart per medical servcie    Plan level 5 consult   Electronic Signatures: Hortencia Pilar (MD)  (Signed 27-Jan-13 15:17)  Authored: General  Aspect/Present Illness, Home Medications, Allergies, History and Physical Exam, Vital Signs, Labs, Radiology, Impression/Plan   Last Updated: 27-Jan-13 15:17 by Hortencia Pilar (MD)

## 2014-09-07 NOTE — Consult Note (Signed)
1. pre-op consultno contraindications to surgery at this timemoderate cardiovascular risk secondary to medical problemscontraindicated secondary to bradycardiaclinically does no have pneumoniauti- levaquin, ucasthmahx hemorragic cvahyperlipidemia- lipitorhtn- bp elevated with painhit head with fall- refused ct headimpaired fasting glucose, check hga1chypokalemia- replace potassium Yeng Frankie , md  Electronic Signatures: Loletha Grayer (MD)  (Signed on 24-Jan-13 19:21)  Authored  Last Updated: 24-Jan-13 19:21 by Loletha Grayer (MD)

## 2014-09-07 NOTE — Discharge Summary (Signed)
PATIENT NAME:  Michele Summers, Michele Summers MR#:  [id number] DATE OF BIRTH:  04-Mar-1932  DATE OF ADMISSION:  06/09/2011 DATE OF DISCHARGE:  06/14/2011  ADMITTING PHYSICIAN: Dr. Earnestine Leys  DISCHARGE PHYSICIAN: Dr. Deanne Coffer  PRIMARY CARE PHYSICIAN: Dr. Clayborn Bigness   PLEASE NOTE THIS IS ADDENDUM TO DR. MILLER'S INTERIM DISCHARGE SUMMARY   ADMITTING DIAGNOSIS: Right hip fracture, displaced subcapital hip fracture.   DISCHARGE DIAGNOSES:  1. Right hip fracture displaced subcapital right hip fracture status post right hip hemiarthroplasty.  2. Respiratory failure due to possible pulmonary embolus versus fat emboli. 3. Pneumonia. 4. Urinary tract infection methicillin-resistant Staphylococcus aureus and enterococcus. 5. Supraventricular tachycardia, which has resolved.  6. Hypertension.  7. Hyperlipidemia.  8. Anemia requiring 2 units of packed red blood cell blood transfusion. 9. Depression, anxiety.  10. Leukocytosis.  11. Hypomagnesemia.  12. Anemia postoperative blood loss.   CONSULTANTS:  1. Dr. Mortimer Fries. 2. Dr. Devona Konig. 3. Dr. Earnestine Leys. 4. Dr. Hortencia Pilar.  5. Case management.  6. Palliative care. 7. Dr. Candiss Norse of nephrology. 8. Dr. Clayborn Bigness, cardiology.   LABORATORY, DIAGNOSTIC AND RADIOLOGICAL DATA: Pelvis AP 01/24 showed right femoral neck angulation deformity. Hip complete 01/24 angulated right femoral neck fracture. 01/24 chest x-ray showed bibasilar infiltrates most consistent with pneumonia. Pelvis AP 01/25 status post right hip arthroplasty, surgical drains are present. Patient is status post right hip arthroplasty right hip 01/25. Chest x-ray 01/26 showed shallow inspiration, atelectasis versus infiltrate within lung bases as well as possible small left pleural effusion. Chest x-ray 01/28 showed cardiomegaly, mild interstitial edema lung base, atelectasis, small pleural effusions are not excluded. CT of the chest 01/26 showed subsegmental pulmonary arterial filling  defects, underlying interstitial infiltrate is appreciated. Component of which may represent pulmonary edema through infectious component, bilateral pleural effusions. Lower extremity Doppler 01/28 showed normal exam.    HOSPITAL COURSE: Initial history and physical were done by Dr. Earnestine Leys. Please refer to his note dated 06/08/2010 for complete details. In brief, this is a 81 year old white female with past medical history of cerebrovascular accident, hemorrhagic stroke, residual right-sided weakness, history of pulmonary embolism who presents with mechanical fall resulting in right hip fracture. Patient uses oxygen 2 liters at night. She was admitted to the hospitalist service. Underwent surgery on 06/10/2011. On 01/25 to 01/26 she developed respiratory failure. ABG was consistent with severe hypoxemia. Patient was lethargic. She was subsequently moved to the Critical Care Unit with nonrebreather  1. Acute hypoxic respiratory failure, likely multifactorial, possible PE with suspected pneumonia. Patient had echo in 2010 with ejection fraction 55%. Echo still pending. Chest x-ray and CT showed possible pneumonia and pulmonary embolus. Patient was initially started on Lovenox and heparin nomogram and was possibly going to be started on Coumadin. She had head CT which showed no bleeding. Because of her worsening hypoxemia and inability to be weaned off nonrebreather, Dr. Verdell Carmine anticoagulated her on 01/26. He discussed with Dr. Devona Konig and Dr. Christophe Louis who agreed. Patient continued to be lethargic and requiring nonrebreather. We added Solu-Medrol. She does have a mild history of reactive airway disease and changed her antibiotics from Levaquin to vancomycin and Zosyn which she received the last three days. ABGs were fairly stable. Last ABG pO2 77, pCO2 45, pH 7.34. Patient continued to have minimal improvement on being able to be weaned off nonrebreather. Later on 01/29 Dr. Mortimer Fries was able to wean  the patient further to 6 liters of oxygen. Family requested patient be transferred to  [facility] as she has had all her care there. Spoke to Dr. Nelda Marseille of Intensive Care Unit who refused and recommended patient hospitalist service Dr. Bonnell Public who accepted the patient.  2. Pulmonary embolism. This could be pulmonary emboli versus fat emboli. With her history of brain bleed we did CT scan and started her on Lovenox then she was switched on 01/28 to heparin. Discussion with Dr. Humphrey Rolls, Dr. Mortimer Fries we decided to hold off on anticoagulation as she had no deep vein thrombosis and this could all be fat emboli.  3. Supraventricular tachycardia. Patient had supraventricular tachycardia on 01/25. Was started on low dose beta blocker. We continued her on heparin initially. We have consulted Dr. Clayborn Bigness who recommended continuing on beta blocker. Echo is still pending.  4. Right hip fracture, postoperative day number operative day four for right hip hemiarthroplasty. Patient was initially receiving Demerol for pain control. We have discontinued this because of her mental status. She has Hemovac which has been having more blood and patient has postoperative anemia which required 2 units of packed red blood cells which she is getting now.  Will need PT once more stable, rehab and follow up with Dr. Earnestine Leys 5. Hypertension. We held her hydrochlorothiazide and Norvasc. We have continued her on metoprolol, Avapro.  6. Hyperlipidemia. We continued Lipitor.  7. Depression. We continued Prozac.  8. Patient had urinary tract infection which grew MRSA and enterococcus. We initially considered switching her to tigecycline but kept her on vancomycin and Zosyn.  9. Anxiety. We held her Xanax due to her lethargy.  10. Patient developed acute renal failure. She was initially getting Lasix, we held this and gave her gentle IV fluid hydration. Uric acid is elevated. I believe she is still dry. Nephrology has been consulted. They have  recommended continuing with IV fluids.  11. Hyperglycemia. A1c was checked and noted to be 5.5, most likely stress reaction.  12. Leukocytosis. This is most likely due to pneumonia.  13. Hypomagnesia. This was repleted.  14. Anemia with shortness of breath, hypotension, postoperative anemia. Patient's family was agreeable and we transfused 2 units of packed red blood cells.  15. Deep vein thrombosis prophylaxis. Initially with heparin now with TED stockings and SCDs.  16. CODE STATUS: FULL CODE. Discussed with palliative care. Family I would like aggressive treatment and transfer to [facility]. Discussed with Dr. Bonnell Public who is acceptable with transfer. Patient will be transferred on 06/14/2011.    PHYSICAL EXAMINATION: VITAL SIGNS: At time of transfer vital signs: Heart rate 58, respiratory rate 20, patient is afebrile with temperature 97.9, sating 95% on 6 liters, blood sugar 147.  MEDICATIONS: She will be transferred on the following medications:  1. Tylenol 650 mg p.o. q.4 hours p.r.n. temperature. 2. Lipitor 10 mg at bedtime.  3. Dulcolax suppository 10 mg rectal at bedtime p.r.n. constipation. 4. Calcium carbonate 500 mg plus vitamin D 200 international units 1 tablet b.i.d.  5. Docusate 240 mg at bedtime.  6. Ferrous sulfate 325 mg p.o. b.i.d.  7. Fluoxetine 20 mg daily.  8. Advair Diskus 250/50, 1 puff b.i.d.  9. Avapro 300 mg daily.  10. Magnesium hydroxide 30 mL orally at bedtime p.r.n. constipation.  11. Singulair 10 mg daily.  12. Sanctura 60 mg daily. 13. Zofran 4 mg IV every four hours p.r.n.  14. Solu-Medrol 125 IV q.8. 15. Protonix 40 mg IV daily.  16. Zosyn 3.375 grams IV q.8. 17. Vancomycin 750 mg IV q.24. 18. Albuterol ipratropium SVN  3 mL q.6 hours p.r.n.  19. Metoprolol 50mg  po bid  OXYGEN: 6 liters nasal cannula oxygen.   DIET: Full liquid diet.   ACTIVITY: Bedrest right now with assistance if patient is able to tolerate.   CODE STATUS: FULL CODE.   Thank  you for allowing me to participate in the care of this patient.   TOTAL TIME SPENT ON DISCHARGE: 60 minutes.  ____________________________ Judeth Horn Royden Purl, MD aaf:cms D: 06/14/2011 14:07:14 ET T: 06/14/2011 14:43:32 ET JOB#: [id number] cc: Mike Craze A. Royden Purl, MD, <Dictator> [name], MD Park Breed, MD Mariane Duval, MD Murlean Iba, MD Dwayne D. Clayborn Bigness, MD Efraim Kaufmann, MD Joaquin Bend MD ELECTRONICALLY SIGNED 06/15/2011 15:31

## 2014-09-07 NOTE — Consult Note (Signed)
Brief Consult Note: Diagnosis: Right greater trochanteric fracture.   Patient was seen by consultant.   Discussed with Attending MD.   Comments: Discussed with Dr. Manuella Ghazi, hospitalist.  Patient has a mildly displaced fracture of the greater trochanter by x-ray.  Patient states she had a secondary fall after he hip fracture was fixed while feeding her pet at home.  She has tenderness over the greater trochanter to palpation, but no groin pain with log rolling of the hip.  She is NVI in the R LE.  Recommend patient work with PT on WBAT using a walker.  May be discharged to rehab per medicine.  Recommend abduction precautions.  Follow-up in the office in 2 weeks with Dr. Sabra Heck.  Electronic Signatures: Thornton Park (MD)  (Signed 26-Jun-13 10:19)  Authored: Brief Consult Note   Last Updated: 26-Jun-13 10:19 by Thornton Park (MD)

## 2014-09-07 NOTE — Discharge Summary (Signed)
PATIENT NAME:  Michele Summers, AUTHIER MR#:  [id number] DATE OF BIRTH:  1932/03/07  DATE OF ADMISSION:  11/08/2011 DATE OF DISCHARGE:  11/09/2011  DISCHARGE DIAGNOSES:  1. Mildly displaced fracture of the greater trochanter, status post fall, with minimal tenderness over the greater trochanter to palpation. No groin pain with logrolling of the hip. Orthopedics recommending outpatient follow-up and walker.  2. Urinary tract infection based on urinalysis. Urine culture growing more than 100,000 colonies of gram-negative rods. Sensitivities are still pending on discharge.   SECONDARY DIAGNOSES:  1. History of right hip fracture, status post right hip hemiarthroplasty.  2. Respiratory failure due to possible pulmonary emboli versus fat emboli.  3. History of supraventricular tachycardia.  4. Hypertension.  5. Hyperlipidemia.  6. Depression/anxiety.  7. Asthma.  8. History of cerebrovascular accident.   CONSULTATIONS:  1. Orthopedics, Dr. Mack Guise.  2. Physical Therapy.   LABORATORY, DIAGNOSTIC AND RADIOLOGICAL DATA:  Right hip x-ray on the 24th of June showed fracture of the superior lateral tip of the right greater trochanter, slightly displaced fracture.  Pelvic x-ray on the 24th of June showed fracture of the proximal tip of the right greater trochanter.  Urinalysis on admission showed 468 WBCs, 3+ bacteria, 74 RBCs, 3+ leukocyte esterase, positive nitrites, WBC in clumps present.  Urine culture is growing more than 10,000 colonies of gram-negative rods.   HISTORY AND SHORT HOSPITAL COURSE: The patient is a 81 year old female with the above-mentioned medical problems who was admitted for a right greater trochanteric fracture. Conservative management was recommended by Orthopedics. Consultation was obtained with Dr. Mack Guise from Orthopedics, who recommended outpatient follow-up with Orthopedics and weight-bearing using a walker with physical therapy. The patient's pain was much better controlled.  She was evaluated by Physical Therapy and was recommended discharge back home with Home Health, which was set up by Care Management. She was discharged home in stable condition.   PERTINENT DISCHARGE PHYSICAL EXAMINATION: VITAL SIGNS: On the date of discharge, her vital signs were as follows: Temperature 98.2, heart rate 68 per minute, respirations 18 per minute, blood pressure 121/51 mmHg. She was saturating 93% on room air. CARDIOVASCULAR: S1, S2 normal. No murmurs, rubs, or gallops. LUNGS: Clear to auscultation bilaterally. No wheezes, rales, rhonchi, or crepitation. ABDOMEN: Soft, benign. NEUROLOGIC: Nonfocal examination. RIGHT HIP: On her right hip area examination, she  had tenderness over the greater trochanter to palpation but did not have any groin pain with logrolling of the hip.   DISCHARGE MEDICATIONS:  1. Colace 100 mg p.o. daily as needed.  2. Lipitor 10 mg p.o. at bedtime.  3. Alprazolam 0.25 mg p.o. daily.  4. Fluoxetine 20 mg p.o. daily.  5. Singulair once daily.  6. Norvasc 2.5 mg p.o. daily.  7. Hydrochlorothiazide/irbesartan 12.5/300 mg 1 tablet p.o. daily.  8. Tylenol 650 mg p.o. every 4 hours as needed.  9. Dilaudid 2 mg p.o. every 12 hours as needed for 7 days.  10. Xarelto 20 mg p.o. daily.  11. Nitrofurantoin 100 mg p.o. daily for 4 days.   DISCHARGE DIET: Low sodium.   DISCHARGE ACTIVITY: As tolerated.   DISCHARGE INSTRUCTIONS AND FOLLOW-UP:  1. The patient was instructed to follow up with her primary care physician, Dr. Clayborn Bigness, in 1 to 2 weeks.  2. She will need followup with Orthopedics, Dr. Earnestine Leys, in 2 to 4 weeks.  3. She was set up to go home with Home Health with physical therapy.   TOTAL TIME DISCHARGING THIS PATIENT: 55 minutes.  ____________________________ Lucina Mellow. Manuella Ghazi, MD vss:cbb D: 11/10/2011 08:20:51 ET T: 11/10/2011 17:38:43 ET JOB#: [id number]  cc: Briea Mcenery S. Manuella Ghazi, MD, <Dictator> [name], MD Park Breed, MD Timoteo Gaul, MD Lucina Mellow [facility] MD ELECTRONICALLY SIGNED 11/13/2011 19:24

## 2014-09-07 NOTE — Op Note (Signed)
PATIENT NAME:  Michele Summers, Michele Summers MR#:  [id number] DATE OF BIRTH:  19-Nov-1931  DATE OF PROCEDURE:  06/10/2011  PREOPERATIVE DIAGNOSIS: Displaced subcapital fracture of right hip.   POSTOPERATIVE DIAGNOSIS: Displaced subcapital fracture of right hip.   PROCEDURE PERFORMED: Right hip hemiarthroplasty (Stryker #4 Accolade stem, 47 mm unipolar head, standard neck length).   SURGEON: Park Breed, M.D.   ANESTHESIA: Spinal.   COMPLICATIONS: None.   DRAINS: Two Autovacs.   ESTIMATED BLOOD LOSS: 200 mL.  REPLACEMENT: None.   DESCRIPTION OF PROCEDURE: The patient was brought to the Operating Room where she underwent satisfactory spinal anesthesia and was placed in the left lateral decubitus position with the beanbag and padded appropriately. The right hip was prepped and draped in sterile fashion. A posterolateral incision was made and dissection carried out down to the fascia which was incised longitudinally. A Charnley retractor was inserted. Electrocautery was used for hemostasis. The short external rotators were exposed and divided at their insertion and tagged. The sciatic nerve was protected. The posterior capsule was opened in a T fashion and tagged. The femoral neck was cut with an oscillating saw at an appropriate angle and the head was removed. The head measured about 47 to 48 mm in size. Trial heads showed the 47 mm to fit the best with the tightest fit. The acetabulum was cleared of debris and soft tissue cauterized. The femoral canal was opened and the canal was then sequentially rasped to a #4 rasp. The calcar reamer was used to smooth the neck. The cut was excellent. Trial reduction was then carried out using 47 mm head and -4 and standard neck lengths. The standard neck length appeared to provide the best leg length and trials were then removed. The joint was thoroughly irrigated. A #4 Accolade stem with a 47 mm unipolar head and standard neck length was inserted. This was reduced and  was very stable. The leg lengths were good. After further irrigation, the posterior capsule was closed with #2 Tycron. The short external rotators were repaired with the same suture. The fascia was closed with #0 Vicryl over an Autovac drain and the subcutaneous tissue was closed with #2-0 Vicryl over another Autovac. The skin was closed with staples. Dry sterile dressing was applied and the Autovac was activated. The patient was transferred to her hospital bed and taken to recovery in good condition having tolerated this well. Her leg lengths appeared to be excellent.  ____________________________ Park Breed, MD hem:slb D: 06/10/2011 14:32:12 ET T: 06/10/2011 15:17:29 ET JOB#: [id number]  cc: Park Breed, MD, <Dictator> Park Breed MD ELECTRONICALLY SIGNED 06/10/2011 17:16

## 2014-09-07 NOTE — H&P (Signed)
PATIENT NAME:  Michele Summers, MOEHLE MR#:  [id number] DATE OF BIRTH:  1932/04/19  DATE OF ADMISSION:  11/08/2011  PRIMARY MD: Clayborn Bigness, MD   ED REFERRING DOCTOR: Dr. Lovena Le    CHIEF COMPLAINT: Status post fall.   HISTORY OF PRESENT ILLNESS: The patient is a 81 year old Caucasian female who was actually hospitalized here in January 2013. At that time she had presented with fall and had sustained a hip fracture. The patient underwent an ORIF of the right hip and her hospital course was complicated by acute respiratory failure, altered mental status, pneumonia, urinary tract infection, supraventricular tachycardia, and anemia. There was concern that she might have had a possible pulmonary emboli and/or fat emboli. The patient was subsequently transferred to [facility] per the family's request. The family reports and the patient reports that she did well after the transfer. She was subsequently transferred to rehab and had done well up until yesterday. The patient reports that she was trying to turn and lost balance and fell. She came to the ED and had x-rays which showed fracture of the proximal tip of the right greater trochanter. The ED physician discussed the case with Dr. Sabra Heck who had operated on her and he felt that the fracture was old. The patient was discharged with analgesics. She returns today with continued pain in the hip area and difficulty with ambulation. I am asked to admit the patient for pain control as well as possible rehab placement. The patient otherwise has been doing well and has not had any fevers, chills. No chest pains. No shortness of breath. No abdominal pain. No nausea, vomiting, diarrhea, or any urinary frequency.   PAST MEDICAL HISTORY:  1. Right hip fracture, displaced, subcapital right hip fracture status post right hip hemiarthroplasty in January.  2. Respiratory failure due to possible pulmonary emboli versus fat emboli.  3. History of supraventricular tachycardia during  her January hospitalization. Also had pneumonia and urinary tract infection at that time.  4. Hypertension.  5. Hyperlipidemia.  6. Depression/anxiety.  7. Asthma. As per patient's report, only uses inhalers.  8. History of cerebrovascular accident with hemorrhage with right-sided weakness and speaking abnormalities. Her speech is back to normal. Her weakness is still mild, persistent.   PAST SURGICAL HISTORY:  1. Status post right hip ORIF.  2. Status post cholecystectomy.  3. Status post cystoscopy by Dr. Jacqlyn Larsen.  4. Status post hysterectomy.  5. History of brain surgery secondary to bleed in the brain presuming there was evacuation of the hematoma or decompression.   ALLERGIES: Sulfa, morphine, bee stings, and Norco.   CURRENT MEDICATIONS: The patient is on: 1. Acetaminophen/hydrocodone 500/5 one tab p.o. b.i.d. as needed for pain.  2. Alprazolam 0.5 one tab p.o. daily.  3. Docusate 100 one tab p.o. daily as needed for constipation. 4. Fluoxetine 20 daily.  5. Lipitor 10 mg at bedtime.  6. Nitrofurantoin 100 mg 1 tab p.o. daily. She is on that chronically for recurrent urinary tract infections. 7. Norvasc 2.5 daily. 8. Premarin 0.625 mg/g vaginal cream application once a week. 9. ProAir 2 puffs q.4 to 6 p.r.n. shortness of breath.  10. Singular 10 daily.  11. Xarelto 20 one tab p.o. q. evening. 12. Ambien 10 at bedtime.   SOCIAL HISTORY: No smoking. No alcohol. No drug use. Lives at home.   FAMILY HISTORY: Mother died of 87, possible CVA. Father died at 39 of COPD.   REVIEW OF SYSTEMS: CONSTITUTIONAL: Denies any fevers. No fatigue. No weakness.  Complains of right hip pain since yesterday. No weight loss. No weight gain. EYES: No blurred or double vision. No pain. No redness. No inflammation. No glaucoma. No cataracts. ENT: No tinnitus. No ear pain. No hearing loss. No seasonal or year round allergies. No difficulty with swallowing. RESPIRATORY: No cough. No wheezing. Does have  history of asthma which is apparently under control. No COPD. CARDIOVASCULAR: No chest pain. No orthopnea. Does complain of edema in both of her lower extremities especially at night. GI: No nausea, vomiting, diarrhea. No abdominal pain. No hematemesis. No rectal bleeding. GU: Denies any dysuria, hematuria, renal calculi, or frequency. ENDOCRINE: Denies any polyuria, nocturia, or thyroid problems. HEME/LYMPH: Denies anemia, easy bruisability, or bleeding. SKIN: No acne. No rash. No changes in mole, hair or skin. MUSCULOSKELETAL: Complains of pain in the right hip. No gout. NEUROLOGIC: No numbness. No weakness. No CVA. No TIA. PSYCHIATRIC: No anxiety. No insomnia. No ADD.   PHYSICAL EXAMINATION:   VITAL SIGNS: Temperature 97.3, pulse 57, respirations 16, blood pressure 129/61, O2 99% on room air.   GENERAL: The patient is a well developed, well nourished Caucasian female currently uncomfortable due to her right hip pain.   HEENT: Head atraumatic, normocephalic. Pupils equally round, reactive to light and accommodation. Extraocular movements intact. There is no conjunctival pallor. No scleral icterus. Nasal exam showed no drainage or ulceration. Oropharynx is clear without any exudates.   NECK: No thyromegaly. No carotid bruits.   CARDIOVASCULAR: Regular rate and rhythm. No murmurs, rubs, clicks, or gallops. PMI is not displaced.   LUNGS: Clear to auscultation bilaterally without any rales, rhonchi, or wheezing.   ABDOMEN: Soft, nontender, nondistended. Positive bowel sounds x4. There is no hepatosplenomegaly.   EXTREMITIES: No clubbing, cyanosis, or edema. There is limited range of movement in the right hip due to pain.   SKIN: No rash.   LYMPHATICS: No lymph nodes palpable.   VASCULAR: Good DP, PT pulses.   PSYCHIATRIC: Not anxious or depressed.   NEUROLOGICAL: Cranial nerves II through XII grossly intact. No focal deficits.   LABORATORY, DIAGNOSTIC, AND RADIOLOGICAL DATA: Evaluations  so far is x-ray done yesterday which shows fracture of the proximal tip of the right greater trochanter. CBC and BMP is currently pending.   ASSESSMENT AND PLAN: The patient is a 81 year old white female with previous history of right hip fracture in January 2013 who presents after another fall due to loss of balance, has small fracture but intractable pain. 1. Right trochanteric fracture which is very small in nature. I have discussed the case with Dr. Sabra Heck who reviewed the x-ray yesterday. He recommends pain control, PT evaluation with weightbearing with a walker as tolerated. The patient may need placement and further rehab if pain is not controlled.  2. Asthma. Will continue p.r.n. ProAir inhaler.  3. Hyperlipidemia. Continue Lipitor.  4. Hypertension. Will continue Norvasc. Blood pressure is currently stable.  5. History of PE, currently on Xarelto. Will continue that as taking before.  6. Recurrent urinary tract infections. Will check a urinalysis. Continue nitrofurantoin for suppressive therapy as before.  7. Miscellaneous. The patient is on full dose Xarelto which should be sufficient for DVT prophylaxis.   TIME SPENT: 40 minutes.    ____________________________ Lafonda Mosses Posey Pronto, MD shp:drc D: 11/08/2011 13:44:14 ET T: 11/08/2011 14:15:01 ET JOB#: [id number]  cc: Guillermo Nehring H. Posey Pronto, MD, <Dictator> [name], MD Alric Seton MD ELECTRONICALLY SIGNED 11/10/2011 16:37

## 2014-09-08 DIAGNOSIS — H2513 Age-related nuclear cataract, bilateral: Secondary | ICD-10-CM | POA: Diagnosis not present

## 2014-09-09 ENCOUNTER — Encounter: Payer: Self-pay | Admitting: *Deleted

## 2014-09-10 DIAGNOSIS — H3532 Exudative age-related macular degeneration: Secondary | ICD-10-CM | POA: Diagnosis not present

## 2014-09-10 NOTE — Anesthesia Preprocedure Evaluation (Deleted)

## 2014-09-15 ENCOUNTER — Ambulatory Visit
Admission: RE | Admit: 2014-09-15 | Discharge: 2014-09-15 | Disposition: A | Discharge status: Home / Self Care | Payer: Medicare Other | Source: Ambulatory Visit | Attending: Ophthalmology | Admitting: Ophthalmology

## 2014-09-15 ENCOUNTER — Encounter
Admission: RE | Disposition: A | Discharge status: Home / Self Care | Payer: Self-pay | Source: Ambulatory Visit | Attending: Ophthalmology

## 2014-09-15 ENCOUNTER — Ambulatory Visit: Payer: Medicare Other | Admitting: Anesthesiology

## 2014-09-15 ENCOUNTER — Encounter: Payer: Self-pay | Admitting: *Deleted

## 2014-09-15 DIAGNOSIS — H2513 Age-related nuclear cataract, bilateral: Secondary | ICD-10-CM | POA: Diagnosis not present

## 2014-09-15 DIAGNOSIS — H2512 Age-related nuclear cataract, left eye: Secondary | ICD-10-CM | POA: Diagnosis present

## 2014-09-15 HISTORY — DX: Essential (primary) hypertension: I10

## 2014-09-15 HISTORY — DX: Fracture of unspecified part of neck of unspecified femur, initial encounter for closed fracture: S72.009A

## 2014-09-15 HISTORY — DX: Presence of cardiac pacemaker: Z95.0

## 2014-09-15 HISTORY — PX: CATARACT EXTRACTION W/PHACO: SHX586

## 2014-09-15 HISTORY — DX: Unspecified osteoarthritis, unspecified site: M19.90

## 2014-09-15 SURGERY — PHACOEMULSIFICATION, CATARACT, WITH IOL INSERTION
Anesthesia: Monitor Anesthesia Care | Laterality: Left

## 2014-09-15 MED ORDER — BSS IO SOLN
INTRAOCULAR | Status: DC | PRN
  Administered 2014-09-15: 125 mL via INTRAOCULAR

## 2014-09-15 MED ORDER — CEFUROXIME OPHTHALMIC INJECTION 1 MG/0.1 ML
INJECTION | OPHTHALMIC | Status: DC | PRN
  Administered 2014-09-15: .1 mg via OPHTHALMIC

## 2014-09-15 MED ORDER — TIMOLOL MALEATE 0.5 % OP SOLN
OPHTHALMIC | Status: DC | PRN
  Administered 2014-09-15: 2 [drp] via OPHTHALMIC

## 2014-09-15 MED ORDER — ACETAMINOPHEN 160 MG/5ML PO SOLN: 325.0000 mg | ORAL | Status: DC | PRN

## 2014-09-15 MED ORDER — BRIMONIDINE TARTRATE 0.2 % OP SOLN
OPHTHALMIC | Status: DC | PRN
  Administered 2014-09-15: 2 [drp] via OPHTHALMIC

## 2014-09-15 MED ORDER — LIDOCAINE HCL (PF) 4 % IJ SOLN
INTRAMUSCULAR | Status: DC | PRN
  Administered 2014-09-15: 1 mL

## 2014-09-15 MED ORDER — POVIDONE-IODINE 5 % OP SOLN
1.0000 "application " | Freq: Once | OPHTHALMIC | Status: AC
  Administered 2014-09-15: 1 via OPHTHALMIC

## 2014-09-15 MED ORDER — MIDAZOLAM HCL 2 MG/2ML IJ SOLN
INTRAMUSCULAR | Status: DC | PRN
  Administered 2014-09-15: 1.5 mg via INTRAVENOUS

## 2014-09-15 MED ORDER — TETRACAINE HCL 0.5 % OP SOLN
1.0000 [drp] | Freq: Once | OPHTHALMIC | Status: AC
  Administered 2014-09-15: 1 [drp] via OPHTHALMIC

## 2014-09-15 MED ORDER — ACETAMINOPHEN 325 MG PO TABS: 325.0000 mg | ORAL_TABLET | ORAL | Status: DC | PRN

## 2014-09-15 MED ORDER — EPINEPHRINE HCL 1 MG/ML IJ SOLN
INTRAMUSCULAR | Status: DC | PRN
  Administered 2014-09-15: 1 mg

## 2014-09-15 MED ORDER — ARMC OPHTHALMIC DILATING GEL
1.0000 "application " | OPHTHALMIC | Status: DC | PRN
  Administered 2014-09-15 (×2): 1 via OPHTHALMIC

## 2014-09-15 MED ORDER — FENTANYL CITRATE (PF) 100 MCG/2ML IJ SOLN
INTRAMUSCULAR | Status: DC | PRN
  Administered 2014-09-15: 50 ug via INTRAVENOUS

## 2014-09-15 SURGICAL SUPPLY — 28 items
APPLICATOR COTTON TIP 3IN (MISCELLANEOUS) ×3 IMPLANT
CANNULA ANT/CHMB 27GA (MISCELLANEOUS) ×3 IMPLANT
DISSECTOR HYDRO NUCLEUS 50X22 (MISCELLANEOUS) ×3 IMPLANT
GLOVE BIO SURGEON STRL SZ7 (GLOVE) ×3 IMPLANT
GLOVE SURG LX 6.5 MICRO (GLOVE) ×2
GLOVE SURG LX STRL 6.5 MICRO (GLOVE) ×1 IMPLANT
GOWN STRL REUS W/ TWL LRG LVL3 (GOWN DISPOSABLE) ×2 IMPLANT
GOWN STRL REUS W/TWL LRG LVL3 (GOWN DISPOSABLE) ×4
LENS IOL ACRSF IQ PC 22.0 (Intraocular Lens) ×1 IMPLANT
LENS IOL ACRYSOF IQ POST 22.0 (Intraocular Lens) ×3 IMPLANT
MARKER SKIN SURG W/RULER VIO (MISCELLANEOUS) ×3 IMPLANT
NEEDLE FILTER BLUNT 18X 1/2SAF (NEEDLE) ×2
NEEDLE FILTER BLUNT 18X1 1/2 (NEEDLE) ×1 IMPLANT
PACK CATARACT BRASINGTON (MISCELLANEOUS) ×3 IMPLANT
PACK EYE AFTER SURG (MISCELLANEOUS) ×3 IMPLANT
PACK OPTHALMIC (MISCELLANEOUS) IMPLANT
RING MALYGIN 7.0 (MISCELLANEOUS) IMPLANT
SOL BAL SALT 15ML (MISCELLANEOUS)
SOLUTION BAL SALT 15ML (MISCELLANEOUS) IMPLANT
SUT ETHILON 10-0 CS-B-6CS-B-6 (SUTURE)
SUT VICRYL  9 0 (SUTURE)
SUT VICRYL 9 0 (SUTURE) IMPLANT
SUTURE EHLN 10-0 CS-B-6CS-B-6 (SUTURE) IMPLANT
SYR 3ML LL SCALE MARK (SYRINGE) ×3 IMPLANT
SYR TB 1ML LUER SLIP (SYRINGE) ×3 IMPLANT
WATER STERILE IRR 500ML POUR (IV SOLUTION) ×3 IMPLANT
WICK EYE OCUCEL (MISCELLANEOUS) IMPLANT
WIPE NON LINTING 3.25X3.25 (MISCELLANEOUS) ×3 IMPLANT

## 2014-09-15 NOTE — Anesthesia Postprocedure Evaluation (Signed)
  Anesthesia Post-op Note  Patient: Michele Summers  Procedure(s) Performed: Procedure(s): CATARACT EXTRACTION PHACO AND INTRAOCULAR LENS PLACEMENT (IOC) (Left)  Anesthesia type:MAC  Patient location: PACU  Post pain: Pain level controlled  Post assessment: Post-op Vital signs reviewed, Patient's Cardiovascular Status Stable, Respiratory Function Stable, Patent Airway and No signs of Nausea or vomiting  Post vital signs: Reviewed and stable  Last Vitals:  Filed Vitals:   09/15/14 0915  BP: 144/61  Pulse:   Temp: 35.9 C  Resp: 16    Level of consciousness: awake, alert  and patient cooperative  Complications: No apparent anesthesia complications

## 2014-09-15 NOTE — Addendum Note (Signed)
Addendum  created 09/15/14 0955 by Ronelle Nigh, MD   Modules edited: Anesthesia Attestations

## 2014-09-15 NOTE — Transfer of Care (Signed)
Immediate Anesthesia Transfer of Care Note  Patient: Michele Summers  Procedure(s) Performed: Procedure(s): CATARACT EXTRACTION PHACO AND INTRAOCULAR LENS PLACEMENT (IOC) (Left)  Patient Location: PACU  Anesthesia Type: MAC  Level of Consciousness: awake, alert  and patient cooperative  Airway and Oxygen Therapy: Patient Spontanous Breathing and Patient connected to supplemental oxygen  Post-op Assessment: Post-op Vital signs reviewed, Patient's Cardiovascular Status Stable, Respiratory Function Stable, Patent Airway and No signs of Nausea or vomiting  Post-op Vital Signs: Reviewed and stable  Complications: No apparent anesthesia complications

## 2014-09-15 NOTE — Op Note (Signed)
Date of Surgery: 09/15/2014  PREOPERATIVE DIAGNOSES: Visually significant nuclear sclerotic cataract, left eye.  POSTOPERATIVE DIAGNOSES: Same  PROCEDURES PERFORMED: Cataract extraction with intraocular lens implant, left eye.  SURGEON: Almon Hercules, M.D.  ANESTHESIA: MAC and topical  IMPLANTS: AcrySof IQ SN60WF +24.0 D, SN: [id number], Exp: 6/38/75  COMPLICATIONS: None.  DESCRIPTION OF PROCEDURE: Therapeutic options were discussed with the patient preoperatively, including a discussion of risks and benefits of surgery. Informed consent was obtained. An IOL-Master and immersion biometry were used to take the lens measurements, and a dilated fundus exam was performed within 6 months of the surgical date.  The patient was premedicated and brought to the operating room and placed on the operating table in the supine position. After adequate anesthesia, the patient was prepped and draped in the usual sterile ophthalmic fashion. A wire lid speculum was inserted and the microscope was positioned. A Superblade was used to create a paracentesis site at the limbus and a small amount of dilute preservative free lidocaine was instilled into the anterior chamber, followed by dispersive viscoelastic. A clear corneal incision was created temporally using a 2.4 mm keratome blade. Capsulorrhexis was then performed. In situ phacoemulsification was performed.  Cortical material was removed with the irrigation-aspiration unit. Dispersive viscoelastic was instilled to open the capsular bag. A posterior chamber intraocular lens with the specifications above was inserted and positioned. Irrigation-aspiration was used to remove all viscoelastic. Cefuroxime 1cc was into the anterior chamber, and the corneal incision was checked and found to be water tight. The eyelid speculum was removed.  The operative eye was covered with protective goggles after instilling 1 drop of timolol and brimonidine. The patient tolerated the  procedure well. There were no complications.

## 2014-09-15 NOTE — Anesthesia Preprocedure Evaluation (Signed)
Anesthesia Evaluation    Airway Mallampati: II  TM Distance: >3 FB Neck ROM: full  Mouth opening: Limited Mouth Opening  Dental  (+) Upper Dentures, Lower Dentures   Pulmonary asthma ,    Pulmonary exam normal       Cardiovascular hypertension, +CHF + pacemaker     Neuro/Psych CVA    GI/Hepatic   Endo/Other    Renal/GU      Musculoskeletal   Abdominal Normal abdominal exam  (+)   Peds  Hematology   Anesthesia Other Findings   Reproductive/Obstetrics                             Anesthesia Physical Anesthesia Plan  ASA: III  Anesthesia Plan: MAC   Post-op Pain Management:    Induction:   Airway Management Planned:   Additional Equipment:   Intra-op Plan:   Post-operative Plan:   Informed Consent: I have reviewed the patients History and Physical, chart, labs and discussed the procedure including the risks, benefits and alternatives for the proposed anesthesia with the patient or authorized representative who has indicated his/her understanding and acceptance.     Plan Discussed with: Anesthesiologist  Anesthesia Plan Comments:         Anesthesia Quick Evaluation

## 2014-09-15 NOTE — Discharge Instructions (Addendum)

## 2014-09-17 MED ORDER — NA HYALUR & NA CHOND-NA HYALUR 0.55-0.5 ML IO KIT
PACK | INTRAOCULAR | Status: DC | PRN
  Administered 2014-09-15: 1 via OPHTHALMIC

## 2014-09-18 DIAGNOSIS — Z950 Presence of cardiac pacemaker: Secondary | ICD-10-CM | POA: Diagnosis not present

## 2014-09-18 DIAGNOSIS — G459 Transient cerebral ischemic attack, unspecified: Secondary | ICD-10-CM | POA: Diagnosis not present

## 2014-09-18 DIAGNOSIS — I495 Sick sinus syndrome: Secondary | ICD-10-CM | POA: Diagnosis not present

## 2014-09-18 DIAGNOSIS — H3530 Unspecified macular degeneration: Secondary | ICD-10-CM | POA: Diagnosis not present

## 2014-09-19 ENCOUNTER — Encounter: Payer: Self-pay | Admitting: Ophthalmology

## 2014-10-29 DIAGNOSIS — H43813 Vitreous degeneration, bilateral: Secondary | ICD-10-CM | POA: Diagnosis not present

## 2014-10-29 DIAGNOSIS — H3532 Exudative age-related macular degeneration: Secondary | ICD-10-CM | POA: Diagnosis not present

## 2014-11-02 ENCOUNTER — Emergency Department: Payer: Medicare Other

## 2014-11-02 ENCOUNTER — Encounter: Payer: Self-pay | Admitting: Emergency Medicine

## 2014-11-02 ENCOUNTER — Emergency Department
Admission: EM | Admit: 2014-11-02 | Discharge: 2014-11-02 | Disposition: A | Discharge status: Home / Self Care | Payer: Medicare Other | Attending: Emergency Medicine | Admitting: Emergency Medicine

## 2014-11-02 DIAGNOSIS — I10 Essential (primary) hypertension: Secondary | ICD-10-CM | POA: Diagnosis not present

## 2014-11-02 DIAGNOSIS — W010XXA Fall on same level from slipping, tripping and stumbling without subsequent striking against object, initial encounter: Secondary | ICD-10-CM | POA: Diagnosis not present

## 2014-11-02 DIAGNOSIS — M25551 Pain in right hip: Secondary | ICD-10-CM | POA: Diagnosis not present

## 2014-11-02 DIAGNOSIS — M7989 Other specified soft tissue disorders: Secondary | ICD-10-CM | POA: Diagnosis not present

## 2014-11-02 DIAGNOSIS — Y998 Other external cause status: Secondary | ICD-10-CM | POA: Diagnosis not present

## 2014-11-02 DIAGNOSIS — S93401A Sprain of unspecified ligament of right ankle, initial encounter: Secondary | ICD-10-CM | POA: Diagnosis not present

## 2014-11-02 DIAGNOSIS — Y9289 Other specified places as the place of occurrence of the external cause: Secondary | ICD-10-CM | POA: Diagnosis not present

## 2014-11-02 DIAGNOSIS — S79921A Unspecified injury of right thigh, initial encounter: Secondary | ICD-10-CM | POA: Diagnosis not present

## 2014-11-02 DIAGNOSIS — S99911A Unspecified injury of right ankle, initial encounter: Secondary | ICD-10-CM | POA: Diagnosis not present

## 2014-11-02 DIAGNOSIS — M25571 Pain in right ankle and joints of right foot: Secondary | ICD-10-CM | POA: Diagnosis not present

## 2014-11-02 DIAGNOSIS — Y9389 Activity, other specified: Secondary | ICD-10-CM | POA: Diagnosis not present

## 2014-11-02 DIAGNOSIS — R52 Pain, unspecified: Secondary | ICD-10-CM

## 2014-11-02 DIAGNOSIS — Z79899 Other long term (current) drug therapy: Secondary | ICD-10-CM | POA: Insufficient documentation

## 2014-11-02 MED ORDER — NAPROXEN 500 MG PO TABS: 500.0000 mg | ORAL_TABLET | Freq: Two times a day (BID) | ORAL | Status: AC

## 2014-11-02 NOTE — ED Notes (Signed)
Pt reports she tripped over a door stop last Sunday at her nieces house. Pt reports pain in to top of right foot. Pt states she also has pain to right thigh when she walks. Pt states she has been treating her pain with tylenol each night with no relief. No swelling or brusing noted. But pt states it was swollen and bruised.

## 2014-11-02 NOTE — ED Provider Notes (Signed)
[facility] Emergency Department Provider Note  ____________________________________________  Time seen: Approximately 3:04 PM  I have reviewed the triage vital signs and the nursing notes.   HISTORY  Chief Complaint Foot Pain    HPI Michele Summers is a 81 y.o. female presents today for right ankle, foot, and lower femur pain post falling about one week ago. She states she tripped over a door stop and twist her foot and ankle. She states that initially there was bruising to the ankle and foot, but that has resolved. Pain continues.   Past Medical History  Diagnosis Date  . Asthma   . Headache(784.0)   . PE (pulmonary embolism)   . DVT (deep venous thrombosis)   . Depression   . CHF (congestive heart failure)   . Stroke   . Presence of permanent cardiac pacemaker   . Hypertension   . Arthritis     Hands, feet, jaw  . Fracture of hip     right    Patient Active Problem List   Diagnosis Date Noted  . Altered mental status 06/14/2011  . Pulmonary embolism 06/14/2011  . Respiratory failure with hypoxia 06/14/2011  . Pneumonia 06/14/2011  . Acute renal failure 06/14/2011  . Femur fracture, right 06/14/2011  . Asthma 06/14/2011  . UTI (lower urinary tract infection) 06/14/2011    Past Surgical History  Procedure Laterality Date  . Abdominal hysterectomy    . Craniotomy    . Bladder surgery    . Brain surgery    . Insert / replace / remove pacemaker    . Cataract extraction w/phaco Left 09/15/2014    Procedure: CATARACT EXTRACTION PHACO AND INTRAOCULAR LENS PLACEMENT (IOC);  Surgeon: Ronnell Freshwater, MD;  Location: [facility];  Service: Ophthalmology;  Laterality: Left;    Current Outpatient Rx  Name  Route  Sig  Dispense  Refill  . albuterol (PROVENTIL HFA;VENTOLIN HFA) 108 (90 BASE) MCG/ACT inhaler   Inhalation   Inhale 2 puffs into the lungs every 6 (six) hours as needed. For shortness of breath         . ALPRAZolam  (XANAX) 0.25 MG tablet   Oral   Take 0.25 mg by mouth at bedtime as needed for anxiety. 1/2 - 1 tab         . amLODipine (NORVASC) 2.5 MG tablet   Oral   Take 5 mg by mouth daily. PM         . atorvastatin (LIPITOR) 10 MG tablet   Oral   Take 10 mg by mouth daily. AM         . FLUoxetine (PROZAC) 20 MG capsule   Oral   Take 20 mg by mouth daily. AM         . montelukast (SINGULAIR) 10 MG tablet   Oral   Take 10 mg by mouth daily. AM         . Multiple Vitamins-Minerals (PRESERVISION AREDS PO)   Oral   Take by mouth. AM         . naproxen (NAPROSYN) 500 MG tablet   Oral   Take 1 tablet (500 mg total) by mouth 2 (two) times daily with a meal.   30 tablet   0   . rivaroxaban 20 MG TABS   Oral   Take 20 mg by mouth daily. Patient taking differently: Take 20 mg by mouth daily. AM   12 tablet      . senna (SENOKOT)  8.6 MG TABS   Oral   Take 1 tablet (8.6 mg total) by mouth 2 (two) times daily.   120 each      . zolpidem (AMBIEN) 10 MG tablet   Oral   Take 10 mg by mouth at bedtime as needed for sleep.           Allergies Morphine and related; Sulfur; and Vicodin  No family history on file.  Social History History  Substance Use Topics  . Smoking status: Never Smoker   . Smokeless tobacco: Never Used  . Alcohol Use: No    Review of Systems Constitutional: No recent illness. Eyes: No visual changes. ENT: No sore throat. Cardiovascular: Denies chest pain or palpitations. Respiratory: Denies shortness of breath. Gastrointestinal: No abdominal pain.  Genitourinary: Negative for dysuria. Musculoskeletal: Pain in right ankle, foot and just above the right knee. Skin: Negative for rash. Neurological: Negative for headaches, focal weakness or numbness. 10-point ROS otherwise negative.  ____________________________________________   PHYSICAL EXAM:  VITAL SIGNS: ED Triage Vitals  Enc Vitals Group     BP 11/02/14 1236 155/65 mmHg      Pulse Rate 11/02/14 1236 72     Resp 11/02/14 1236 18     Temp 11/02/14 1236 97.6 F (36.4 C)     Temp Source 11/02/14 1236 Oral     SpO2 11/02/14 1236 95 %     Weight 11/02/14 1236 152 lb (68.947 kg)     Height 11/02/14 1236 5\' 6"  (1.676 m)     Head Cir --      Peak Flow --      Pain Score 11/02/14 1242 8     Pain Loc --      Pain Edu? --      Excl. in [place]? --     Constitutional: Alert and oriented. Well appearing and in no acute distress. Eyes: Conjunctivae are normal. EOMI. Head: Atraumatic. Nose: No congestion/rhinnorhea. Neck: No stridor.  Respiratory: Normal respiratory effort.   Musculoskeletal: Tenderness to right lateral malleolus.  Neurologic:  Normal speech and language. No gross focal neurologic deficits are appreciated. Speech is normal. No gait instability. Skin:  Skin is warm, dry and intact. Atraumatic. Psychiatric: Mood and affect are normal. Speech and behavior are normal.  ____________________________________________   LABS (all labs ordered are listed, but only abnormal results are displayed)  Labs Reviewed - No data to display ____________________________________________  RADIOLOGY  No acute abnormality ____________________________________________   PROCEDURES  Procedure(s) performed: Ankle stirrup splint applied by tech. Neurovascularly intact post splint application.   ____________________________________________   INITIAL IMPRESSION / ASSESSMENT AND PLAN / ED COURSE  Pertinent labs & imaging results that were available during my care of the patient were reviewed by me and considered in my medical decision making (see chart for details).  Patient was advised to follow up with the orthopedic doctor for symptoms that are not improving over the week. She was advised to return to the ER for symptoms that change or worsen if she is unable to schedule an appointment. ____________________________________________   FINAL CLINICAL IMPRESSION(S)  / ED DIAGNOSES  Final diagnoses:  Pain  Ankle sprain, right, initial encounter      Victorino Dike, FNP 11/02/14 1830  Lisa Roca, MD 11/03/14 1447

## 2014-12-03 DIAGNOSIS — I10 Essential (primary) hypertension: Secondary | ICD-10-CM | POA: Diagnosis not present

## 2014-12-03 DIAGNOSIS — F411 Generalized anxiety disorder: Secondary | ICD-10-CM | POA: Diagnosis not present

## 2014-12-03 DIAGNOSIS — Z0001 Encounter for general adult medical examination with abnormal findings: Secondary | ICD-10-CM | POA: Diagnosis not present

## 2014-12-03 DIAGNOSIS — G4700 Insomnia, unspecified: Secondary | ICD-10-CM | POA: Diagnosis not present

## 2014-12-03 DIAGNOSIS — I499 Cardiac arrhythmia, unspecified: Secondary | ICD-10-CM | POA: Diagnosis not present

## 2014-12-03 DIAGNOSIS — I69392 Facial weakness following cerebral infarction: Secondary | ICD-10-CM | POA: Diagnosis not present

## 2014-12-03 DIAGNOSIS — N390 Urinary tract infection, site not specified: Secondary | ICD-10-CM | POA: Diagnosis not present

## 2014-12-03 DIAGNOSIS — J449 Chronic obstructive pulmonary disease, unspecified: Secondary | ICD-10-CM | POA: Diagnosis not present

## 2014-12-18 DIAGNOSIS — Z950 Presence of cardiac pacemaker: Secondary | ICD-10-CM | POA: Diagnosis not present

## 2014-12-18 DIAGNOSIS — I495 Sick sinus syndrome: Secondary | ICD-10-CM | POA: Diagnosis not present

## 2014-12-18 DIAGNOSIS — I10 Essential (primary) hypertension: Secondary | ICD-10-CM | POA: Diagnosis not present

## 2015-01-07 DIAGNOSIS — H402233 Chronic angle-closure glaucoma, bilateral, severe stage: Secondary | ICD-10-CM | POA: Diagnosis not present

## 2015-02-18 DIAGNOSIS — H2511 Age-related nuclear cataract, right eye: Secondary | ICD-10-CM | POA: Diagnosis not present

## 2015-03-13 DIAGNOSIS — H2511 Age-related nuclear cataract, right eye: Secondary | ICD-10-CM | POA: Diagnosis not present

## 2015-03-20 NOTE — Discharge Instructions (Signed)

## 2015-03-23 ENCOUNTER — Encounter
Admission: RE | Disposition: A | Discharge status: Home / Self Care | Payer: Self-pay | Source: Ambulatory Visit | Attending: Ophthalmology

## 2015-03-23 ENCOUNTER — Ambulatory Visit: Payer: Medicare Other | Admitting: Anesthesiology

## 2015-03-23 ENCOUNTER — Ambulatory Visit
Admission: RE | Admit: 2015-03-23 | Discharge: 2015-03-23 | Disposition: A | Discharge status: Home / Self Care | Payer: Medicare Other | Source: Ambulatory Visit | Attending: Ophthalmology | Admitting: Ophthalmology

## 2015-03-23 DIAGNOSIS — H2511 Age-related nuclear cataract, right eye: Secondary | ICD-10-CM | POA: Insufficient documentation

## 2015-03-23 DIAGNOSIS — E7800 Pure hypercholesterolemia, unspecified: Secondary | ICD-10-CM | POA: Insufficient documentation

## 2015-03-23 DIAGNOSIS — Z8673 Personal history of transient ischemic attack (TIA), and cerebral infarction without residual deficits: Secondary | ICD-10-CM | POA: Diagnosis not present

## 2015-03-23 DIAGNOSIS — Z90710 Acquired absence of both cervix and uterus: Secondary | ICD-10-CM | POA: Diagnosis not present

## 2015-03-23 DIAGNOSIS — Z950 Presence of cardiac pacemaker: Secondary | ICD-10-CM | POA: Insufficient documentation

## 2015-03-23 DIAGNOSIS — M1990 Unspecified osteoarthritis, unspecified site: Secondary | ICD-10-CM | POA: Diagnosis not present

## 2015-03-23 DIAGNOSIS — Z882 Allergy status to sulfonamides status: Secondary | ICD-10-CM | POA: Insufficient documentation

## 2015-03-23 DIAGNOSIS — I10 Essential (primary) hypertension: Secondary | ICD-10-CM | POA: Insufficient documentation

## 2015-03-23 DIAGNOSIS — Z9109 Other allergy status, other than to drugs and biological substances: Secondary | ICD-10-CM | POA: Diagnosis not present

## 2015-03-23 DIAGNOSIS — M25472 Effusion, left ankle: Secondary | ICD-10-CM | POA: Insufficient documentation

## 2015-03-23 DIAGNOSIS — J449 Chronic obstructive pulmonary disease, unspecified: Secondary | ICD-10-CM | POA: Diagnosis not present

## 2015-03-23 DIAGNOSIS — Z885 Allergy status to narcotic agent status: Secondary | ICD-10-CM | POA: Diagnosis not present

## 2015-03-23 DIAGNOSIS — Z96649 Presence of unspecified artificial hip joint: Secondary | ICD-10-CM | POA: Diagnosis not present

## 2015-03-23 DIAGNOSIS — Z9049 Acquired absence of other specified parts of digestive tract: Secondary | ICD-10-CM | POA: Diagnosis not present

## 2015-03-23 DIAGNOSIS — F329 Major depressive disorder, single episode, unspecified: Secondary | ICD-10-CM | POA: Diagnosis not present

## 2015-03-23 DIAGNOSIS — M25471 Effusion, right ankle: Secondary | ICD-10-CM | POA: Insufficient documentation

## 2015-03-23 DIAGNOSIS — I4891 Unspecified atrial fibrillation: Secondary | ICD-10-CM | POA: Insufficient documentation

## 2015-03-23 DIAGNOSIS — Z9842 Cataract extraction status, left eye: Secondary | ICD-10-CM | POA: Insufficient documentation

## 2015-03-23 DIAGNOSIS — J45909 Unspecified asthma, uncomplicated: Secondary | ICD-10-CM | POA: Diagnosis not present

## 2015-03-23 DIAGNOSIS — Z7951 Long term (current) use of inhaled steroids: Secondary | ICD-10-CM | POA: Diagnosis not present

## 2015-03-23 HISTORY — DX: Pure hypercholesterolemia, unspecified: E78.00

## 2015-03-23 HISTORY — DX: Presence of dental prosthetic device (complete) (partial): Z97.2

## 2015-03-23 HISTORY — DX: Cardiac arrhythmia, unspecified: I49.9

## 2015-03-23 HISTORY — PX: CATARACT EXTRACTION W/PHACO: SHX586

## 2015-03-23 HISTORY — DX: Complete loss of teeth, unspecified cause, unspecified class: K08.109

## 2015-03-23 HISTORY — DX: Urinary tract infection, site not specified: N39.0

## 2015-03-23 HISTORY — DX: Cerebral infarction, unspecified: I63.9

## 2015-03-23 SURGERY — PHACOEMULSIFICATION, CATARACT, WITH IOL INSERTION
Anesthesia: Monitor Anesthesia Care | Laterality: Right | Wound class: Clean

## 2015-03-23 MED ORDER — POVIDONE-IODINE 5 % OP SOLN
1.0000 "application " | OPHTHALMIC | Status: DC | PRN
  Administered 2015-03-23: 1 via OPHTHALMIC

## 2015-03-23 MED ORDER — EPINEPHRINE HCL 1 MG/ML IJ SOLN
INTRAMUSCULAR | Status: DC | PRN
  Administered 2015-03-23: 95 mL via OPHTHALMIC

## 2015-03-23 MED ORDER — FENTANYL CITRATE (PF) 100 MCG/2ML IJ SOLN
INTRAMUSCULAR | Status: DC | PRN
  Administered 2015-03-23: 50 ug via INTRAVENOUS

## 2015-03-23 MED ORDER — CEFUROXIME OPHTHALMIC INJECTION 1 MG/0.1 ML
INJECTION | OPHTHALMIC | Status: DC | PRN
  Administered 2015-03-23: 0.1 mL via INTRACAMERAL

## 2015-03-23 MED ORDER — NA HYALUR & NA CHOND-NA HYALUR 0.4-0.35 ML IO KIT
PACK | INTRAOCULAR | Status: DC | PRN
  Administered 2015-03-23: 1 mL via INTRAOCULAR

## 2015-03-23 MED ORDER — TIMOLOL MALEATE 0.5 % OP SOLN
OPHTHALMIC | Status: DC | PRN
  Administered 2015-03-23: 1 [drp] via OPHTHALMIC

## 2015-03-23 MED ORDER — LACTATED RINGERS IV SOLN: INTRAVENOUS | Status: DC

## 2015-03-23 MED ORDER — TETRACAINE HCL 0.5 % OP SOLN
1.0000 [drp] | OPHTHALMIC | Status: DC | PRN
  Administered 2015-03-23: 1 [drp] via OPHTHALMIC

## 2015-03-23 MED ORDER — BRIMONIDINE TARTRATE 0.2 % OP SOLN
OPHTHALMIC | Status: DC | PRN
  Administered 2015-03-23: 1 [drp] via OPHTHALMIC

## 2015-03-23 MED ORDER — ARMC OPHTHALMIC DILATING GEL
1.0000 "application " | OPHTHALMIC | Status: DC | PRN
  Administered 2015-03-23 (×2): 1 via OPHTHALMIC

## 2015-03-23 MED ORDER — BALANCED SALT IO SOLN
INTRAOCULAR | Status: DC | PRN
  Administered 2015-03-23: 1 mL via OPHTHALMIC

## 2015-03-23 MED ORDER — MIDAZOLAM HCL 5 MG/5ML IJ SOLN
INTRAMUSCULAR | Status: DC | PRN
  Administered 2015-03-23: 1 mg via INTRAVENOUS

## 2015-03-23 SURGICAL SUPPLY — 29 items
APPLICATOR COTTON TIP 3IN (MISCELLANEOUS) ×2 IMPLANT
CANNULA ANT/CHMB 27GA (MISCELLANEOUS) ×2 IMPLANT
DISSECTOR HYDRO NUCLEUS 50X22 (MISCELLANEOUS) ×2 IMPLANT
GLOVE BIO SURGEON STRL SZ7 (GLOVE) ×2 IMPLANT
GLOVE SURG LX 6.5 MICRO (GLOVE) ×1
GLOVE SURG LX STRL 6.5 MICRO (GLOVE) ×1 IMPLANT
GOWN STRL REUS W/ TWL LRG LVL3 (GOWN DISPOSABLE) ×2 IMPLANT
GOWN STRL REUS W/TWL LRG LVL3 (GOWN DISPOSABLE) ×2
LENS IOL ACRSF IQ PC 23.0 (Intraocular Lens) ×1 IMPLANT
LENS IOL ACRYSOF IQ POST 23.0 (Intraocular Lens) ×2 IMPLANT
MARKER SKIN SURG W/RULER VIO (MISCELLANEOUS) ×2 IMPLANT
NEEDLE FILTER BLUNT 18X 1/2SAF (NEEDLE) ×1
NEEDLE FILTER BLUNT 18X1 1/2 (NEEDLE) ×1 IMPLANT
PACK CATARACT BRASINGTON (MISCELLANEOUS) ×2 IMPLANT
PACK EYE AFTER SURG (MISCELLANEOUS) ×2 IMPLANT
PACK OPTHALMIC (MISCELLANEOUS) ×2 IMPLANT
RING MALYGIN 7.0 (MISCELLANEOUS) IMPLANT
SOL BAL SALT 15ML (MISCELLANEOUS)
SOLUTION BAL SALT 15ML (MISCELLANEOUS) IMPLANT
SUT ETHILON 10-0 CS-B-6CS-B-6 (SUTURE)
SUT VICRYL  9 0 (SUTURE)
SUT VICRYL 9 0 (SUTURE) IMPLANT
SUTURE EHLN 10-0 CS-B-6CS-B-6 (SUTURE) IMPLANT
SYR 3ML LL SCALE MARK (SYRINGE) ×2 IMPLANT
SYR TB 1ML LUER SLIP (SYRINGE) ×2 IMPLANT
WATER STERILE IRR 250ML POUR (IV SOLUTION) ×2 IMPLANT
WATER STERILE IRR 500ML POUR (IV SOLUTION) IMPLANT
WICK EYE OCUCEL (MISCELLANEOUS) ×2 IMPLANT
WIPE NON LINTING 3.25X3.25 (MISCELLANEOUS) ×2 IMPLANT

## 2015-03-23 NOTE — H&P (Signed)
H+P reviewed and is up to date, please see paper chart.  

## 2015-03-23 NOTE — Anesthesia Procedure Notes (Signed)
Procedure Name: MAC Performed by: Christean Silvestri Pre-anesthesia Checklist: Patient identified, Emergency Drugs available, Suction available, Timeout performed and Patient being monitored Patient Re-evaluated:Patient Re-evaluated prior to inductionOxygen Delivery Method: Nasal cannula Placement Confirmation: positive ETCO2     

## 2015-03-23 NOTE — Anesthesia Preprocedure Evaluation (Signed)
Anesthesia Evaluation  Patient identified by MRN, date of birth, ID band Patient awake    Airway Mallampati: II  TM Distance: >3 FB Neck ROM: Full    Dental  (+) Upper Dentures, Lower Dentures   Pulmonary COPD,  COPD inhaler,           Cardiovascular hypertension, Pt. on medications +CHF  + pacemaker      Neuro/Psych    GI/Hepatic   Endo/Other    Renal/GU      Musculoskeletal  (+) Arthritis , Osteoarthritis,    Abdominal   Peds  Hematology   Anesthesia Other Findings   Reproductive/Obstetrics                             Anesthesia Physical Anesthesia Plan  ASA: III  Anesthesia Plan: MAC   Post-op Pain Management:    Induction: Intravenous  Airway Management Planned:   Additional Equipment:   Intra-op Plan:   Post-operative Plan:   Informed Consent: I have reviewed the patients History and Physical, chart, labs and discussed the procedure including the risks, benefits and alternatives for the proposed anesthesia with the patient or authorized representative who has indicated his/her understanding and acceptance.     Plan Discussed with: CRNA  Anesthesia Plan Comments:         Anesthesia Quick Evaluation

## 2015-03-23 NOTE — Transfer of Care (Signed)
Immediate Anesthesia Transfer of Care Note  Patient: Michele Summers  Procedure(s) Performed: Procedure(s): CATARACT EXTRACTION PHACO AND INTRAOCULAR LENS PLACEMENT (IOC) (Right)  Patient Location: PACU  Anesthesia Type: MAC  Level of Consciousness: awake, alert  and patient cooperative  Airway and Oxygen Therapy: Patient Spontanous Breathing and Patient connected to supplemental oxygen  Post-op Assessment: Post-op Vital signs reviewed, Patient's Cardiovascular Status Stable, Respiratory Function Stable, Patent Airway and No signs of Nausea or vomiting  Post-op Vital Signs: Reviewed and stable  Complications: No apparent anesthesia complications

## 2015-03-23 NOTE — Op Note (Signed)
Date of Surgery: 03/23/2015  PREOPERATIVE DIAGNOSES: Visually significant nuclear sclerotic cataract, right eye.  POSTOPERATIVE DIAGNOSES: Same  PROCEDURES PERFORMED: Cataract extraction with intraocular lens implant, right eye.  SURGEON: Almon Hercules, M.D.  ANESTHESIA: MAC and topical  IMPLANTS: AcrySof IQ [id number] +23.0 D   Implant Name Type Inv. Item Serial No. Manufacturer Lot No. LRB No. Used  IMPLANT LENS - [id number] Intraocular Lens IMPLANT LENS [id number] ALCON   Right 1     COMPLICATIONS: None.  DESCRIPTION OF PROCEDURE: Therapeutic options were discussed with the patient preoperatively, including a discussion of risks and benefits of surgery. Informed consent was obtained. An IOL-Master and immersion biometry were used to take the lens measurements, and a dilated fundus exam was performed within 6 months of the surgical date.  The patient was premedicated and brought to the operating room and placed on the operating table in the supine position. After adequate anesthesia, the patient was prepped and draped in the usual sterile ophthalmic fashion. A wire lid speculum was inserted and the microscope was positioned. A Superblade was used to create a paracentesis site at the limbus and a small amount of dilute preservative free lidocaine was instilled into the anterior chamber, followed by dispersive viscoelastic. A clear corneal incision was created temporally using a 2.4 mm keratome blade. Capsulorrhexis was then performed. In situ phacoemulsification was performed.  Cortical material was removed with the irrigation-aspiration unit. Dispersive viscoelastic was instilled to open the capsular bag. A posterior chamber intraocular lens with the specifications above was inserted and positioned. Irrigation-aspiration was used to remove all viscoelastic. Cefuroxime 1cc was instilled into the anterior chamber, and the corneal incision was checked and found to be water tight. The eyelid  speculum was removed.  The operative eye was covered with protective goggles after instilling 1 drop of timolol and brimonidine. The patient tolerated the procedure well. There were no complications.

## 2015-03-23 NOTE — Anesthesia Postprocedure Evaluation (Signed)
  Anesthesia Post-op Note  Patient: Michele Summers  Procedure(s) Performed: Procedure(s): CATARACT EXTRACTION PHACO AND INTRAOCULAR LENS PLACEMENT (IOC) (Right)  Anesthesia type:MAC  Patient location: PACU  Post pain: Pain level controlled  Post assessment: Post-op Vital signs reviewed, Patient's Cardiovascular Status Stable, Respiratory Function Stable, Patent Airway and No signs of Nausea or vomiting  Post vital signs: Reviewed and stable  Last Vitals:  Filed Vitals:   03/23/15 1043  BP: 130/51  Pulse: 68  Temp:   Resp: 17    Level of consciousness: awake, alert  and patient cooperative  Complications: No apparent anesthesia complications

## 2015-03-24 ENCOUNTER — Encounter: Payer: Self-pay | Admitting: Ophthalmology

## 2015-04-13 DIAGNOSIS — G4700 Insomnia, unspecified: Secondary | ICD-10-CM | POA: Diagnosis not present

## 2015-04-13 DIAGNOSIS — J449 Chronic obstructive pulmonary disease, unspecified: Secondary | ICD-10-CM | POA: Diagnosis not present

## 2015-04-13 DIAGNOSIS — F329 Major depressive disorder, single episode, unspecified: Secondary | ICD-10-CM | POA: Diagnosis not present

## 2015-04-13 DIAGNOSIS — I499 Cardiac arrhythmia, unspecified: Secondary | ICD-10-CM | POA: Diagnosis not present

## 2015-04-13 DIAGNOSIS — I10 Essential (primary) hypertension: Secondary | ICD-10-CM | POA: Diagnosis not present

## 2015-04-13 DIAGNOSIS — Z23 Encounter for immunization: Secondary | ICD-10-CM | POA: Diagnosis not present

## 2015-04-13 DIAGNOSIS — F411 Generalized anxiety disorder: Secondary | ICD-10-CM | POA: Diagnosis not present

## 2015-04-20 DIAGNOSIS — G459 Transient cerebral ischemic attack, unspecified: Secondary | ICD-10-CM | POA: Diagnosis not present

## 2015-04-20 DIAGNOSIS — R079 Chest pain, unspecified: Secondary | ICD-10-CM | POA: Diagnosis not present

## 2015-04-20 DIAGNOSIS — I4891 Unspecified atrial fibrillation: Secondary | ICD-10-CM | POA: Diagnosis not present

## 2015-04-20 DIAGNOSIS — M79603 Pain in arm, unspecified: Secondary | ICD-10-CM | POA: Diagnosis not present

## 2015-04-27 DIAGNOSIS — M79603 Pain in arm, unspecified: Secondary | ICD-10-CM | POA: Diagnosis not present

## 2015-04-27 DIAGNOSIS — I4891 Unspecified atrial fibrillation: Secondary | ICD-10-CM | POA: Diagnosis not present

## 2015-04-27 DIAGNOSIS — R079 Chest pain, unspecified: Secondary | ICD-10-CM | POA: Diagnosis not present

## 2015-05-25 ENCOUNTER — Encounter: Payer: Self-pay | Admitting: Ophthalmology

## 2015-05-25 NOTE — Addendum Note (Signed)
Addendum  created 05/25/15 1158 by Londell Moh, CRNA   Modules edited: Anesthesia Events, Narrator, Narrator Events   Narrator:  Narrator: Event Log Edited   Narrator Events:  Delete Induction event

## 2015-06-05 DIAGNOSIS — H402223 Chronic angle-closure glaucoma, left eye, severe stage: Secondary | ICD-10-CM | POA: Diagnosis not present

## 2015-06-19 DIAGNOSIS — H3530 Unspecified macular degeneration: Secondary | ICD-10-CM | POA: Diagnosis not present

## 2015-06-19 DIAGNOSIS — Z950 Presence of cardiac pacemaker: Secondary | ICD-10-CM | POA: Diagnosis not present

## 2015-06-19 DIAGNOSIS — I495 Sick sinus syndrome: Secondary | ICD-10-CM | POA: Diagnosis not present

## 2015-06-19 DIAGNOSIS — I10 Essential (primary) hypertension: Secondary | ICD-10-CM | POA: Diagnosis not present

## 2015-07-17 ENCOUNTER — Emergency Department
Admission: EM | Admit: 2015-07-17 | Discharge: 2015-07-17 | Disposition: A | Discharge status: Home / Self Care | Payer: Medicare Other | Attending: Emergency Medicine | Admitting: Emergency Medicine

## 2015-07-17 ENCOUNTER — Emergency Department: Payer: Medicare Other

## 2015-07-17 DIAGNOSIS — M549 Dorsalgia, unspecified: Secondary | ICD-10-CM | POA: Diagnosis not present

## 2015-07-17 DIAGNOSIS — W010XXA Fall on same level from slipping, tripping and stumbling without subsequent striking against object, initial encounter: Secondary | ICD-10-CM | POA: Insufficient documentation

## 2015-07-17 DIAGNOSIS — S3992XA Unspecified injury of lower back, initial encounter: Secondary | ICD-10-CM | POA: Diagnosis present

## 2015-07-17 DIAGNOSIS — Y9289 Other specified places as the place of occurrence of the external cause: Secondary | ICD-10-CM | POA: Insufficient documentation

## 2015-07-17 DIAGNOSIS — M545 Low back pain: Secondary | ICD-10-CM | POA: Diagnosis not present

## 2015-07-17 DIAGNOSIS — R531 Weakness: Secondary | ICD-10-CM | POA: Insufficient documentation

## 2015-07-17 DIAGNOSIS — S39012A Strain of muscle, fascia and tendon of lower back, initial encounter: Secondary | ICD-10-CM | POA: Insufficient documentation

## 2015-07-17 DIAGNOSIS — N390 Urinary tract infection, site not specified: Secondary | ICD-10-CM | POA: Diagnosis not present

## 2015-07-17 DIAGNOSIS — Y9389 Activity, other specified: Secondary | ICD-10-CM | POA: Insufficient documentation

## 2015-07-17 DIAGNOSIS — R112 Nausea with vomiting, unspecified: Secondary | ICD-10-CM | POA: Insufficient documentation

## 2015-07-17 DIAGNOSIS — I10 Essential (primary) hypertension: Secondary | ICD-10-CM | POA: Diagnosis not present

## 2015-07-17 DIAGNOSIS — F329 Major depressive disorder, single episode, unspecified: Secondary | ICD-10-CM | POA: Insufficient documentation

## 2015-07-17 DIAGNOSIS — Y998 Other external cause status: Secondary | ICD-10-CM | POA: Insufficient documentation

## 2015-07-17 LAB — COMPREHENSIVE METABOLIC PANEL
ALBUMIN: 3.9 g/dL (ref 3.5–5.0)
ALK PHOS: 72 U/L (ref 38–126)
ALT: 9 U/L — AB (ref 14–54)
AST: 18 U/L (ref 15–41)
Anion gap: 5 (ref 5–15)
BUN: 16 mg/dL (ref 6–20)
CALCIUM: 9.2 mg/dL (ref 8.9–10.3)
CHLORIDE: 108 mmol/L (ref 101–111)
CO2: 29 mmol/L (ref 22–32)
CREATININE: 0.75 mg/dL (ref 0.44–1.00)
GFR calc Af Amer: >60 mL/min (ref >60)
GFR calc non Af Amer: >60 mL/min (ref >60)
GLUCOSE: 105 mg/dL — AB (ref 65–99)
Potassium: 4 mmol/L (ref 3.5–5.1)
SODIUM: 142 mmol/L (ref 135–145)
Total Bilirubin: 0.8 mg/dL (ref 0.3–1.2)
Total Protein: 6.7 g/dL (ref 6.5–8.1)

## 2015-07-17 LAB — URINALYSIS COMPLETE WITH MICROSCOPIC (ARMC ONLY)
Bilirubin Urine: NEGATIVE
GLUCOSE, UA: NEGATIVE mg/dL
HGB URINE DIPSTICK: NEGATIVE
Ketones, ur: NEGATIVE mg/dL
NITRITE: POSITIVE — AB
PH: 5 (ref 5.0–8.0)
Protein, ur: NEGATIVE mg/dL
SPECIFIC GRAVITY, URINE: 1.012 (ref 1.005–1.030)

## 2015-07-17 LAB — CBC WITH DIFFERENTIAL/PLATELET
Basophils Absolute: 0.1 10*3/uL (ref 0–0.1)
Basophils Relative: 1 %
EOS ABS: 0.4 10*3/uL (ref 0–0.7)
EOS PCT: 7 %
HCT: 37.8 % (ref 35.0–47.0)
Hemoglobin: 12.6 g/dL (ref 12.0–16.0)
LYMPHS ABS: 1.2 10*3/uL (ref 1.0–3.6)
Lymphocytes Relative: 20 %
MCH: 26.5 pg (ref 26.0–34.0)
MCHC: 33.2 g/dL (ref 32.0–36.0)
MCV: 79.9 fL — ABNORMAL LOW (ref 80.0–100.0)
Monocytes Absolute: 0.4 10*3/uL (ref 0.2–0.9)
Monocytes Relative: 6 %
Neutro Abs: 4.1 10*3/uL (ref 1.4–6.5)
Neutrophils Relative %: 66 %
PLATELETS: 301 10*3/uL (ref 150–440)
RBC: 4.73 MIL/uL (ref 3.80–5.20)
RDW: 13.9 % (ref 11.5–14.5)
WBC: 6.2 10*3/uL (ref 3.6–11.0)

## 2015-07-17 LAB — TROPONIN I: Troponin I: <0.03 ng/mL (ref <0.031)

## 2015-07-17 MED ORDER — DOCUSATE SODIUM 100 MG PO CAPS: 100.0000 mg | ORAL_CAPSULE | Freq: Every day | ORAL | Status: AC | PRN

## 2015-07-17 MED ORDER — CIPROFLOXACIN HCL 500 MG PO TABS: 500.0000 mg | ORAL_TABLET | Freq: Two times a day (BID) | ORAL | Status: AC

## 2015-07-17 MED ORDER — SODIUM CHLORIDE 0.9 % IV SOLN
1000.0000 mL | Freq: Once | INTRAVENOUS | Status: AC
  Administered 2015-07-17: 1000 mL via INTRAVENOUS

## 2015-07-17 MED ORDER — OXYCODONE-ACETAMINOPHEN 5-325 MG PO TABS: 2.0000 | ORAL_TABLET | Freq: Four times a day (QID) | ORAL | Status: DC | PRN

## 2015-07-17 MED ORDER — MORPHINE SULFATE (PF) 4 MG/ML IV SOLN
4.0000 mg | Freq: Once | INTRAVENOUS | Status: AC
  Administered 2015-07-17: 4 mg via INTRAVENOUS
  Filled 2015-07-17: qty 1

## 2015-07-17 MED ORDER — DEXTROSE 5 % IV SOLN
1.0000 g | Freq: Once | INTRAVENOUS | Status: AC
  Administered 2015-07-17: 1 g via INTRAVENOUS
  Filled 2015-07-17: qty 10

## 2015-07-17 NOTE — ED Notes (Signed)
Pt comes into the ED via EMS from home with c/o lower back pain for the past 2 weeks since tripping and falling.. States she also has increased urinary frequency and pain with urination.

## 2015-07-17 NOTE — Discharge Instructions (Signed)
utiLumbosacral Strain Lumbosacral strain is a strain of any of the parts that make up your lumbosacral vertebrae. Your lumbosacral vertebrae are the bones that make up the lower third of your backbone. Your lumbosacral vertebrae are held together by muscles and tough, fibrous tissue (ligaments).  CAUSES  A sudden blow to your back can cause lumbosacral strain. Also, anything that causes an excessive stretch of the muscles in the low back can cause this strain. This is typically seen when people exert themselves strenuously, fall, lift heavy objects, bend, or crouch repeatedly. RISK FACTORS  Physically demanding work.  Participation in pushing or pulling sports or sports that require a sudden twist of the back (tennis, golf, baseball).  Weight lifting.  Excessive lower back curvature.  Forward-tilted pelvis.  Weak back or abdominal muscles or both.  Tight hamstrings. SIGNS AND SYMPTOMS  Lumbosacral strain may cause pain in the area of your injury or pain that moves (radiates) down your leg.  DIAGNOSIS Your health care provider can often diagnose lumbosacral strain through a physical exam. In some cases, you may need tests such as X-ray exams.  TREATMENT  Treatment for your lower back injury depends on many factors that your clinician will have to evaluate. However, most treatment will include the use of anti-inflammatory medicines. HOME CARE INSTRUCTIONS   Avoid hard physical activities (tennis, racquetball, waterskiing) if you are not in proper physical condition for it. This may aggravate or create problems.  If you have a back problem, avoid sports requiring sudden body movements. Swimming and walking are generally safer activities.  Maintain good posture.  Maintain a healthy weight.  For acute conditions, you may put ice on the injured area.  Put ice in a plastic bag.  Place a towel between your skin and the bag.  Leave the ice on for 20 minutes, 2-3 times a day.  When  the low back starts healing, stretching and strengthening exercises may be recommended. SEEK MEDICAL CARE IF:  Your back pain is getting worse.  You experience severe back pain not relieved with medicines. SEEK IMMEDIATE MEDICAL CARE IF:   You have numbness, tingling, weakness, or problems with the use of your arms or legs.  There is a change in bowel or bladder control.  You have increasing pain in any area of the body, including your belly (abdomen).  You notice shortness of breath, dizziness, or feel faint.  You feel sick to your stomach (nauseous), are throwing up (vomiting), or become sweaty.  You notice discoloration of your toes or legs, or your feet get very cold. MAKE SURE YOU:   Understand these instructions.  Will watch your condition.  Will get help right away if you are not doing well or get worse.   This information is not intended to replace advice given to you by your health care provider. Make sure you discuss any questions you have with your health care provider.   Document Released: 02/09/2005 Document Revised: 05/23/2014 Document Reviewed: 12/19/2012 Elsevier Interactive Patient Education 2016 Else[employer] Urinary Tract Infection Urinary tract infections (UTIs) can develop anywhere along your urinary tract. Your urinary tract is your body's drainage system for removing wastes and extra water. Your urinary tract includes two kidneys, two ureters, a bladder, and a urethra. Your kidneys are a pair of bean-shaped organs. Each kidney is about the size of your fist. They are located below your ribs, one on each side of your spine. CAUSES Infections are caused by microbes, which are microscopic organisms,  including fungi, viruses, and bacteria. These organisms are so small that they can only be seen through a microscope. Bacteria are the microbes that most commonly cause UTIs. SYMPTOMS  Symptoms of UTIs may vary by age and gender of the patient and by the location  of the infection. Symptoms in young women typically include a frequent and intense urge to urinate and a painful, burning feeling in the bladder or urethra during urination. Older women and men are more likely to be tired, shaky, and weak and have muscle aches and abdominal pain. A fever may mean the infection is in your kidneys. Other symptoms of a kidney infection include pain in your back or sides below the ribs, nausea, and vomiting. DIAGNOSIS To diagnose a UTI, your caregiver will ask you about your symptoms. Your caregiver will also ask you to provide a urine sample. The urine sample will be tested for bacteria and white blood cells. White blood cells are made by your body to help fight infection. TREATMENT  Typically, UTIs can be treated with medication. Because most UTIs are caused by a bacterial infection, they usually can be treated with the use of antibiotics. The choice of antibiotic and length of treatment depend on your symptoms and the type of bacteria causing your infection. HOME CARE INSTRUCTIONS  If you were prescribed antibiotics, take them exactly as your caregiver instructs you. Finish the medication even if you feel better after you have only taken some of the medication.  Drink enough water and fluids to keep your urine clear or pale yellow.  Avoid caffeine, tea, and carbonated beverages. They tend to irritate your bladder.  Empty your bladder often. Avoid holding urine for long periods of time.  Empty your bladder before and after sexual intercourse.  After a bowel movement, women should cleanse from front to back. Use each tissue only once. SEEK MEDICAL CARE IF:   You have back pain.  You develop a fever.  Your symptoms do not begin to resolve within 3 days. SEEK IMMEDIATE MEDICAL CARE IF:   You have severe back pain or lower abdominal pain.  You develop chills.  You have nausea or vomiting.  You have continued burning or discomfort with urination. MAKE SURE  YOU:   Understand these instructions.  Will watch your condition.  Will get help right away if you are not doing well or get worse.   This information is not intended to replace advice given to you by your health care provider. Make sure you discuss any questions you have with your health care provider.   Document Released: 02/09/2005 Document Revised: 01/21/2015 Document Reviewed: 06/10/2011 Elsevier Interactive Patient Education 2016 Elsevier Inc. Weakness Weakness is a lack of strength. It may be felt all over the body (generalized) or in one specific part of the body (focal). Some causes of weakness can be serious. You may need further medical evaluation, especially if you are elderly or you have a history of immunosuppression (such as chemotherapy or HIV), kidney disease, heart disease, or diabetes. CAUSES  Weakness can be caused by many different things, including:  Infection.  Physical exhaustion.  Internal bleeding or other blood loss that results in a lack of red blood cells (anemia).  Dehydration. This cause is more common in elderly people.  Side effects or electrolyte abnormalities from medicines, such as pain medicines or sedatives.  Emotional distress, anxiety, or depression.  Circulation problems, especially severe peripheral arterial disease.  Heart disease, such as rapid atrial fibrillation, bradycardia,  or heart failure.  Nervous system disorders, such as Guillain-Barr syndrome, multiple sclerosis, or stroke. DIAGNOSIS  To find the cause of your weakness, your caregiver will take your history and perform a physical exam. Lab tests or X-rays may also be ordered, if needed. TREATMENT  Treatment of weakness depends on the cause of your symptoms and can vary greatly. HOME CARE INSTRUCTIONS   Rest as needed.  Eat a well-balanced diet.  Try to get some exercise every day.  Only take over-the-counter or prescription medicines as directed by your  caregiver. SEEK MEDICAL CARE IF:   Your weakness seems to be getting worse or spreads to other parts of your body.  You develop new aches or pains. SEEK IMMEDIATE MEDICAL CARE IF:   You cannot perform your normal daily activities, such as getting dressed and feeding yourself.  You cannot walk up and down stairs, or you feel exhausted when you do so.  You have shortness of breath or chest pain.  You have difficulty moving parts of your body.  You have weakness in only one area of the body or on only one side of the body.  You have a fever.  You have trouble speaking or swallowing.  You cannot control your bladder or bowel movements.  You have black or bloody vomit or stools. MAKE SURE YOU:  Understand these instructions.  Will watch your condition.  Will get help right away if you are not doing well or get worse.   This information is not intended to replace advice given to you by your health care provider. Make sure you discuss any questions you have with your health care provider.   Document Released: 05/02/2005 Document Revised: 11/01/2011 Document Reviewed: 07/01/2011 Elsevier Interactive Patient Education [employer].

## 2015-07-17 NOTE — ED Provider Notes (Addendum)
[facility] Emergency Department Provider Note     Time seen: ----------------------------------------- 2:24 PM on 07/17/2015 -----------------------------------------    I have reviewed the triage vital signs and the nursing notes.   HISTORY  Chief Complaint Fall; Back Pain; and Urinary Frequency    HPI Michele Summers is a 82 y.o. female who presents to ER for lower back pain for the past 2 weeks since tripping and falling. Patient is also had increased urinary frequency and weakness. Patient states she still weak and can only walk a few feet. She states her son cannot take care of her and cannot dresser. She currently lives with one of her sons. She also reports nausea and vomiting as well as recent constipation.   Past Medical History  Diagnosis Date  . Asthma   . Headache(784.0)   . PE (pulmonary embolism)   . DVT (deep venous thrombosis) ([place])   . Depression   . CHF (congestive heart failure) ([place])   . Presence of permanent cardiac pacemaker   . Arthritis     Hands, feet, jaw  . Fracture of hip ([place])     right  . Dysrhythmia     A-FIB  . Hypercholesterolemia   . UTI (urinary tract infection)     FREQUENT  . Stroke [facility]) 2006    CVA-CEREBRAL ANEURYSM  . Stroke (cerebrum) ([facility])     DIFFICULT FOR HER TO COMPLETE HER SENTENCES,RESPONDS SLOWLY  . Hypertension     CONTROLLED ON MEDS  . Full dentures     UPPER AND LOWER    Patient Active Problem List   Diagnosis Date Noted  . Altered mental status 06/14/2011  . Pulmonary embolism ([place]) 06/14/2011  . Respiratory failure with hypoxia ([place]) 06/14/2011  . Pneumonia 06/14/2011  . Acute renal failure ([place]) 06/14/2011  . Femur fracture, right ([place]) 06/14/2011  . Asthma 06/14/2011  . UTI (lower urinary tract infection) 06/14/2011    Past Surgical History  Procedure Laterality Date  . Abdominal hysterectomy    . Craniotomy    . Bladder surgery    . Brain surgery    . Insert / replace  / remove pacemaker    . Cataract extraction w/phaco Left 09/15/2014    Procedure: CATARACT EXTRACTION PHACO AND INTRAOCULAR LENS PLACEMENT (IOC);  Surgeon: Ronnell Freshwater, MD;  Location: [facility];  Service: Ophthalmology;  Laterality: Left;  . Cholecystectomy    . Joint replacement      TOTAL HIP  . Cardiac catheterization    . Cataract extraction w/phaco Right 03/23/2015    Procedure: CATARACT EXTRACTION PHACO AND INTRAOCULAR LENS PLACEMENT (IOC);  Surgeon: Ronnell Freshwater, MD;  Location: [place];  Service: Ophthalmology;  Laterality: Right;    Allergies Bee pollen; Sulfa antibiotics; Vicodin; and Morphine and related  Social History Social History  Substance Use Topics  . Smoking status: Never Smoker   . Smokeless tobacco: Never Used  . Alcohol Use: No    Review of Systems Constitutional: Negative for fever. Eyes: Negative for visual changes. ENT: Negative for sore throat. Cardiovascular: Negative for chest pain. Respiratory: Negative for shortness of breath. Gastrointestinal: Negative for abdominal pain, positive for nausea and vomiting Genitourinary: Positive for polyuria Musculoskeletal: Positive for low back pain Skin: Negative for rash. Neurological: Negative for headaches, positive for generalized weakness  10-point ROS otherwise negative.  ____________________________________________   PHYSICAL EXAM:  VITAL SIGNS: ED Triage Vitals  Enc Vitals Group     BP 07/17/15 1323  146/92 mmHg     Pulse Rate 07/17/15 1323 74     Resp 07/17/15 1323 18     Temp 07/17/15 1323 97.6 F (36.4 C)     Temp Source 07/17/15 1323 Oral     SpO2 07/17/15 1323 97 %     Weight 07/17/15 1323 140 lb (63.504 kg)     Height 07/17/15 1323 5\' 6"  (1.676 m)     Head Cir --      Peak Flow --      Pain Score 07/17/15 1324 6     Pain Loc --      Pain Edu? --      Excl. in [place]? --     Constitutional: Alert and oriented. Mild distress Eyes:  Conjunctivae are injected. PERRL. Normal extraocular movements. ENT   Head: Normocephalic and atraumatic.   Nose: No congestion/rhinnorhea.   Mouth/Throat: Mucous membranes are moist.   Neck: No stridor. Cardiovascular: Normal rate, regular rhythm. Normal and symmetric distal pulses are present in all extremities. No murmurs, rubs, or gallops. Respiratory: Normal respiratory effort without tachypnea nor retractions. Breath sounds are clear and equal bilaterally. No wheezes/rales/rhonchi. Gastrointestinal: Soft and nontender. No distention. No abdominal bruits.  Musculoskeletal: Limited range of motion particularly of the lower extremities. Lumbar spine tenderness. Neurologic:  Normal speech and language. No gross focal neurologic deficits are appreciated. Patient with minimal ability to ambulate or safely pivot. Require significant assistance. Generalized weakness, nothing focal Skin:  Skin is warm, dry and intact. No rash noted. Psychiatric: Depressed mood and affect ____________________________________________  EKG: Interpreted by me. Atrial fibrillation with occasional paced rhythm. Normal axis. No evidence of acute infarction. Rate is 90 bpm  ____________________________________________  ED COURSE:  Pertinent labs & imaging results that were available during my care of the patient were reviewed by me and considered in my medical decision making (see chart for details). Patient with weakness, polyuria, fall with low back pain. She will need basic labs and imaging. ____________________________________________    LABS (pertinent positives/negatives)  Labs Reviewed  URINALYSIS COMPLETEWITH MICROSCOPIC ([facility] ONLY) - Abnormal; Notable for the following:    Color, Urine YELLOW (*)    APPearance HAZY (*)    Nitrite POSITIVE (*)    Leukocytes, UA 3+ (*)    Bacteria, UA RARE (*)    Squamous Epithelial / LPF 0-5 (*)    All other components within normal limits  CBC WITH  DIFFERENTIAL/PLATELET - Abnormal; Notable for the following:    MCV 79.9 (*)    All other components within normal limits  COMPREHENSIVE METABOLIC PANEL - Abnormal; Notable for the following:    Glucose, Bld 105 (*)    ALT 9 (*)    All other components within normal limits  URINE CULTURE  TROPONIN I    RADIOLOGY Images were viewed by me  Lumbar spine films IMPRESSION: Osteopenia without evidence of fracture or significant spondylosis. ____________________________________________  FINAL ASSESSMENT AND PLAN  Weakness, UTI, lumbosacral strain  Plan: Patient with labs and imaging as dictated above. Patient with weakness and significant UTI. Urine culture was sent and she was given IV Rocephin. Patient prefers to go home, I will try to arrange for home health and physical therapy. Labs are unremarkable.   Earleen Newport, MD   Earleen Newport, MD 07/17/15 [place], MD 07/17/15 [id number]

## 2015-07-19 LAB — URINE CULTURE: Special Requests: NORMAL

## 2015-07-24 DIAGNOSIS — H402233 Chronic angle-closure glaucoma, bilateral, severe stage: Secondary | ICD-10-CM | POA: Diagnosis not present

## 2015-07-27 DIAGNOSIS — N390 Urinary tract infection, site not specified: Secondary | ICD-10-CM | POA: Diagnosis not present

## 2015-07-27 DIAGNOSIS — M545 Low back pain: Secondary | ICD-10-CM | POA: Diagnosis not present

## 2015-07-27 DIAGNOSIS — R262 Difficulty in walking, not elsewhere classified: Secondary | ICD-10-CM | POA: Diagnosis not present

## 2015-07-27 DIAGNOSIS — I10 Essential (primary) hypertension: Secondary | ICD-10-CM | POA: Diagnosis not present

## 2015-07-27 DIAGNOSIS — F329 Major depressive disorder, single episode, unspecified: Secondary | ICD-10-CM | POA: Diagnosis not present

## 2015-07-27 DIAGNOSIS — F411 Generalized anxiety disorder: Secondary | ICD-10-CM | POA: Diagnosis not present

## 2015-07-27 DIAGNOSIS — M6281 Muscle weakness (generalized): Secondary | ICD-10-CM | POA: Diagnosis not present

## 2015-07-29 DIAGNOSIS — H402233 Chronic angle-closure glaucoma, bilateral, severe stage: Secondary | ICD-10-CM | POA: Diagnosis not present

## 2015-08-12 DIAGNOSIS — M25551 Pain in right hip: Secondary | ICD-10-CM | POA: Diagnosis not present

## 2015-08-12 DIAGNOSIS — J449 Chronic obstructive pulmonary disease, unspecified: Secondary | ICD-10-CM | POA: Diagnosis not present

## 2015-08-12 DIAGNOSIS — Z96641 Presence of right artificial hip joint: Secondary | ICD-10-CM | POA: Diagnosis not present

## 2015-08-12 DIAGNOSIS — Z950 Presence of cardiac pacemaker: Secondary | ICD-10-CM | POA: Diagnosis not present

## 2015-08-12 DIAGNOSIS — I69321 Dysphasia following cerebral infarction: Secondary | ICD-10-CM | POA: Diagnosis not present

## 2015-08-12 DIAGNOSIS — R55 Syncope and collapse: Secondary | ICD-10-CM | POA: Diagnosis not present

## 2015-08-12 DIAGNOSIS — Z9181 History of falling: Secondary | ICD-10-CM | POA: Diagnosis not present

## 2015-08-12 DIAGNOSIS — Z86718 Personal history of other venous thrombosis and embolism: Secondary | ICD-10-CM | POA: Diagnosis not present

## 2015-08-12 DIAGNOSIS — F329 Major depressive disorder, single episode, unspecified: Secondary | ICD-10-CM | POA: Diagnosis not present

## 2015-08-12 DIAGNOSIS — M6281 Muscle weakness (generalized): Secondary | ICD-10-CM | POA: Diagnosis not present

## 2015-08-12 DIAGNOSIS — R2689 Other abnormalities of gait and mobility: Secondary | ICD-10-CM | POA: Diagnosis not present

## 2015-08-12 DIAGNOSIS — F411 Generalized anxiety disorder: Secondary | ICD-10-CM | POA: Diagnosis not present

## 2015-08-13 DIAGNOSIS — I69321 Dysphasia following cerebral infarction: Secondary | ICD-10-CM | POA: Diagnosis not present

## 2015-08-13 DIAGNOSIS — M25551 Pain in right hip: Secondary | ICD-10-CM | POA: Diagnosis not present

## 2015-08-13 DIAGNOSIS — R2689 Other abnormalities of gait and mobility: Secondary | ICD-10-CM | POA: Diagnosis not present

## 2015-08-13 DIAGNOSIS — F329 Major depressive disorder, single episode, unspecified: Secondary | ICD-10-CM | POA: Diagnosis not present

## 2015-08-13 DIAGNOSIS — M6281 Muscle weakness (generalized): Secondary | ICD-10-CM | POA: Diagnosis not present

## 2015-08-13 DIAGNOSIS — R55 Syncope and collapse: Secondary | ICD-10-CM | POA: Diagnosis not present

## 2015-08-19 DIAGNOSIS — M25551 Pain in right hip: Secondary | ICD-10-CM | POA: Diagnosis not present

## 2015-08-19 DIAGNOSIS — I69321 Dysphasia following cerebral infarction: Secondary | ICD-10-CM | POA: Diagnosis not present

## 2015-08-19 DIAGNOSIS — R55 Syncope and collapse: Secondary | ICD-10-CM | POA: Diagnosis not present

## 2015-08-19 DIAGNOSIS — F329 Major depressive disorder, single episode, unspecified: Secondary | ICD-10-CM | POA: Diagnosis not present

## 2015-08-19 DIAGNOSIS — R2689 Other abnormalities of gait and mobility: Secondary | ICD-10-CM | POA: Diagnosis not present

## 2015-08-19 DIAGNOSIS — M6281 Muscle weakness (generalized): Secondary | ICD-10-CM | POA: Diagnosis not present

## 2015-08-20 DIAGNOSIS — R55 Syncope and collapse: Secondary | ICD-10-CM | POA: Diagnosis not present

## 2015-08-20 DIAGNOSIS — R2689 Other abnormalities of gait and mobility: Secondary | ICD-10-CM | POA: Diagnosis not present

## 2015-08-20 DIAGNOSIS — M6281 Muscle weakness (generalized): Secondary | ICD-10-CM | POA: Diagnosis not present

## 2015-08-20 DIAGNOSIS — M25551 Pain in right hip: Secondary | ICD-10-CM | POA: Diagnosis not present

## 2015-08-20 DIAGNOSIS — F329 Major depressive disorder, single episode, unspecified: Secondary | ICD-10-CM | POA: Diagnosis not present

## 2015-08-20 DIAGNOSIS — I69321 Dysphasia following cerebral infarction: Secondary | ICD-10-CM | POA: Diagnosis not present

## 2015-08-25 DIAGNOSIS — R55 Syncope and collapse: Secondary | ICD-10-CM | POA: Diagnosis not present

## 2015-08-25 DIAGNOSIS — R2689 Other abnormalities of gait and mobility: Secondary | ICD-10-CM | POA: Diagnosis not present

## 2015-08-25 DIAGNOSIS — M25551 Pain in right hip: Secondary | ICD-10-CM | POA: Diagnosis not present

## 2015-08-25 DIAGNOSIS — F329 Major depressive disorder, single episode, unspecified: Secondary | ICD-10-CM | POA: Diagnosis not present

## 2015-08-25 DIAGNOSIS — M6281 Muscle weakness (generalized): Secondary | ICD-10-CM | POA: Diagnosis not present

## 2015-08-25 DIAGNOSIS — I69321 Dysphasia following cerebral infarction: Secondary | ICD-10-CM | POA: Diagnosis not present

## 2015-08-26 DIAGNOSIS — M6281 Muscle weakness (generalized): Secondary | ICD-10-CM | POA: Diagnosis not present

## 2015-08-26 DIAGNOSIS — M25551 Pain in right hip: Secondary | ICD-10-CM | POA: Diagnosis not present

## 2015-08-26 DIAGNOSIS — R2689 Other abnormalities of gait and mobility: Secondary | ICD-10-CM | POA: Diagnosis not present

## 2015-08-26 DIAGNOSIS — I69321 Dysphasia following cerebral infarction: Secondary | ICD-10-CM | POA: Diagnosis not present

## 2015-08-26 DIAGNOSIS — R55 Syncope and collapse: Secondary | ICD-10-CM | POA: Diagnosis not present

## 2015-08-26 DIAGNOSIS — F329 Major depressive disorder, single episode, unspecified: Secondary | ICD-10-CM | POA: Diagnosis not present

## 2015-08-27 DIAGNOSIS — R262 Difficulty in walking, not elsewhere classified: Secondary | ICD-10-CM | POA: Diagnosis not present

## 2015-08-27 DIAGNOSIS — I10 Essential (primary) hypertension: Secondary | ICD-10-CM | POA: Diagnosis not present

## 2015-08-27 DIAGNOSIS — F329 Major depressive disorder, single episode, unspecified: Secondary | ICD-10-CM | POA: Diagnosis not present

## 2015-08-27 DIAGNOSIS — R55 Syncope and collapse: Secondary | ICD-10-CM | POA: Diagnosis not present

## 2015-08-27 DIAGNOSIS — M6281 Muscle weakness (generalized): Secondary | ICD-10-CM | POA: Diagnosis not present

## 2015-08-27 DIAGNOSIS — F5102 Adjustment insomnia: Secondary | ICD-10-CM | POA: Diagnosis not present

## 2015-08-27 DIAGNOSIS — F411 Generalized anxiety disorder: Secondary | ICD-10-CM | POA: Diagnosis not present

## 2015-08-27 DIAGNOSIS — N390 Urinary tract infection, site not specified: Secondary | ICD-10-CM | POA: Diagnosis not present

## 2015-08-27 DIAGNOSIS — I499 Cardiac arrhythmia, unspecified: Secondary | ICD-10-CM | POA: Diagnosis not present

## 2015-08-27 DIAGNOSIS — M25551 Pain in right hip: Secondary | ICD-10-CM | POA: Diagnosis not present

## 2015-08-27 DIAGNOSIS — R2689 Other abnormalities of gait and mobility: Secondary | ICD-10-CM | POA: Diagnosis not present

## 2015-08-27 DIAGNOSIS — I69321 Dysphasia following cerebral infarction: Secondary | ICD-10-CM | POA: Diagnosis not present

## 2015-09-01 DIAGNOSIS — F329 Major depressive disorder, single episode, unspecified: Secondary | ICD-10-CM | POA: Diagnosis not present

## 2015-09-01 DIAGNOSIS — M25551 Pain in right hip: Secondary | ICD-10-CM | POA: Diagnosis not present

## 2015-09-01 DIAGNOSIS — I69321 Dysphasia following cerebral infarction: Secondary | ICD-10-CM | POA: Diagnosis not present

## 2015-09-01 DIAGNOSIS — R2689 Other abnormalities of gait and mobility: Secondary | ICD-10-CM | POA: Diagnosis not present

## 2015-09-01 DIAGNOSIS — R55 Syncope and collapse: Secondary | ICD-10-CM | POA: Diagnosis not present

## 2015-09-01 DIAGNOSIS — M6281 Muscle weakness (generalized): Secondary | ICD-10-CM | POA: Diagnosis not present

## 2015-09-02 DIAGNOSIS — M6281 Muscle weakness (generalized): Secondary | ICD-10-CM | POA: Diagnosis not present

## 2015-09-02 DIAGNOSIS — M25551 Pain in right hip: Secondary | ICD-10-CM | POA: Diagnosis not present

## 2015-09-02 DIAGNOSIS — R2689 Other abnormalities of gait and mobility: Secondary | ICD-10-CM | POA: Diagnosis not present

## 2015-09-02 DIAGNOSIS — F329 Major depressive disorder, single episode, unspecified: Secondary | ICD-10-CM | POA: Diagnosis not present

## 2015-09-02 DIAGNOSIS — I69321 Dysphasia following cerebral infarction: Secondary | ICD-10-CM | POA: Diagnosis not present

## 2015-09-02 DIAGNOSIS — R55 Syncope and collapse: Secondary | ICD-10-CM | POA: Diagnosis not present

## 2015-09-03 DIAGNOSIS — M6281 Muscle weakness (generalized): Secondary | ICD-10-CM | POA: Diagnosis not present

## 2015-09-03 DIAGNOSIS — I69321 Dysphasia following cerebral infarction: Secondary | ICD-10-CM | POA: Diagnosis not present

## 2015-09-03 DIAGNOSIS — F329 Major depressive disorder, single episode, unspecified: Secondary | ICD-10-CM | POA: Diagnosis not present

## 2015-09-03 DIAGNOSIS — M25551 Pain in right hip: Secondary | ICD-10-CM | POA: Diagnosis not present

## 2015-09-03 DIAGNOSIS — R2689 Other abnormalities of gait and mobility: Secondary | ICD-10-CM | POA: Diagnosis not present

## 2015-09-03 DIAGNOSIS — R55 Syncope and collapse: Secondary | ICD-10-CM | POA: Diagnosis not present

## 2015-09-08 DIAGNOSIS — F329 Major depressive disorder, single episode, unspecified: Secondary | ICD-10-CM | POA: Diagnosis not present

## 2015-09-08 DIAGNOSIS — I69321 Dysphasia following cerebral infarction: Secondary | ICD-10-CM | POA: Diagnosis not present

## 2015-09-08 DIAGNOSIS — M6281 Muscle weakness (generalized): Secondary | ICD-10-CM | POA: Diagnosis not present

## 2015-09-08 DIAGNOSIS — M25551 Pain in right hip: Secondary | ICD-10-CM | POA: Diagnosis not present

## 2015-09-08 DIAGNOSIS — R2689 Other abnormalities of gait and mobility: Secondary | ICD-10-CM | POA: Diagnosis not present

## 2015-09-08 DIAGNOSIS — R55 Syncope and collapse: Secondary | ICD-10-CM | POA: Diagnosis not present

## 2015-09-10 DIAGNOSIS — R2689 Other abnormalities of gait and mobility: Secondary | ICD-10-CM | POA: Diagnosis not present

## 2015-09-10 DIAGNOSIS — R55 Syncope and collapse: Secondary | ICD-10-CM | POA: Diagnosis not present

## 2015-09-10 DIAGNOSIS — I69321 Dysphasia following cerebral infarction: Secondary | ICD-10-CM | POA: Diagnosis not present

## 2015-09-10 DIAGNOSIS — F329 Major depressive disorder, single episode, unspecified: Secondary | ICD-10-CM | POA: Diagnosis not present

## 2015-09-10 DIAGNOSIS — M25551 Pain in right hip: Secondary | ICD-10-CM | POA: Diagnosis not present

## 2015-09-10 DIAGNOSIS — M6281 Muscle weakness (generalized): Secondary | ICD-10-CM | POA: Diagnosis not present

## 2015-09-17 DIAGNOSIS — I495 Sick sinus syndrome: Secondary | ICD-10-CM | POA: Diagnosis not present

## 2015-09-17 DIAGNOSIS — H3530 Unspecified macular degeneration: Secondary | ICD-10-CM | POA: Diagnosis not present

## 2015-09-17 DIAGNOSIS — G459 Transient cerebral ischemic attack, unspecified: Secondary | ICD-10-CM | POA: Diagnosis not present

## 2015-09-17 DIAGNOSIS — Z950 Presence of cardiac pacemaker: Secondary | ICD-10-CM | POA: Diagnosis not present

## 2015-09-17 DIAGNOSIS — I10 Essential (primary) hypertension: Secondary | ICD-10-CM | POA: Diagnosis not present

## 2015-11-04 DIAGNOSIS — H402233 Chronic angle-closure glaucoma, bilateral, severe stage: Secondary | ICD-10-CM | POA: Diagnosis not present

## 2015-12-15 DIAGNOSIS — B373 Candidiasis of vulva and vagina: Secondary | ICD-10-CM | POA: Diagnosis not present

## 2015-12-15 DIAGNOSIS — I10 Essential (primary) hypertension: Secondary | ICD-10-CM | POA: Diagnosis not present

## 2015-12-15 DIAGNOSIS — R300 Dysuria: Secondary | ICD-10-CM | POA: Diagnosis not present

## 2015-12-15 DIAGNOSIS — N390 Urinary tract infection, site not specified: Secondary | ICD-10-CM | POA: Diagnosis not present

## 2015-12-17 DIAGNOSIS — I10 Essential (primary) hypertension: Secondary | ICD-10-CM | POA: Diagnosis not present

## 2015-12-17 DIAGNOSIS — I495 Sick sinus syndrome: Secondary | ICD-10-CM | POA: Diagnosis not present

## 2015-12-17 DIAGNOSIS — Z950 Presence of cardiac pacemaker: Secondary | ICD-10-CM | POA: Diagnosis not present

## 2015-12-17 DIAGNOSIS — G459 Transient cerebral ischemic attack, unspecified: Secondary | ICD-10-CM | POA: Diagnosis not present

## 2015-12-17 DIAGNOSIS — H3530 Unspecified macular degeneration: Secondary | ICD-10-CM | POA: Diagnosis not present

## 2015-12-30 DIAGNOSIS — I10 Essential (primary) hypertension: Secondary | ICD-10-CM | POA: Diagnosis not present

## 2015-12-30 DIAGNOSIS — J449 Chronic obstructive pulmonary disease, unspecified: Secondary | ICD-10-CM | POA: Diagnosis not present

## 2015-12-30 DIAGNOSIS — N390 Urinary tract infection, site not specified: Secondary | ICD-10-CM | POA: Diagnosis not present

## 2015-12-30 DIAGNOSIS — Z0001 Encounter for general adult medical examination with abnormal findings: Secondary | ICD-10-CM | POA: Diagnosis not present

## 2015-12-30 DIAGNOSIS — F411 Generalized anxiety disorder: Secondary | ICD-10-CM | POA: Diagnosis not present

## 2015-12-30 DIAGNOSIS — I6523 Occlusion and stenosis of bilateral carotid arteries: Secondary | ICD-10-CM | POA: Diagnosis not present

## 2015-12-30 DIAGNOSIS — F5102 Adjustment insomnia: Secondary | ICD-10-CM | POA: Diagnosis not present

## 2016-01-01 DIAGNOSIS — H3554 Dystrophies primarily involving the retinal pigment epithelium: Secondary | ICD-10-CM | POA: Diagnosis not present

## 2016-01-11 ENCOUNTER — Other Ambulatory Visit: Payer: Self-pay

## 2016-01-20 DIAGNOSIS — I6523 Occlusion and stenosis of bilateral carotid arteries: Secondary | ICD-10-CM | POA: Diagnosis not present

## 2016-01-22 DIAGNOSIS — I6523 Occlusion and stenosis of bilateral carotid arteries: Secondary | ICD-10-CM | POA: Diagnosis not present

## 2016-01-22 DIAGNOSIS — N390 Urinary tract infection, site not specified: Secondary | ICD-10-CM | POA: Diagnosis not present

## 2016-01-22 DIAGNOSIS — J449 Chronic obstructive pulmonary disease, unspecified: Secondary | ICD-10-CM | POA: Diagnosis not present

## 2016-01-22 DIAGNOSIS — I10 Essential (primary) hypertension: Secondary | ICD-10-CM | POA: Diagnosis not present

## 2016-01-22 DIAGNOSIS — I499 Cardiac arrhythmia, unspecified: Secondary | ICD-10-CM | POA: Diagnosis not present

## 2016-02-02 DIAGNOSIS — I6523 Occlusion and stenosis of bilateral carotid arteries: Secondary | ICD-10-CM | POA: Diagnosis not present

## 2016-02-02 DIAGNOSIS — F329 Major depressive disorder, single episode, unspecified: Secondary | ICD-10-CM | POA: Diagnosis not present

## 2016-02-02 DIAGNOSIS — I10 Essential (primary) hypertension: Secondary | ICD-10-CM | POA: Diagnosis not present

## 2016-02-02 DIAGNOSIS — N390 Urinary tract infection, site not specified: Secondary | ICD-10-CM | POA: Diagnosis not present

## 2016-02-02 DIAGNOSIS — I499 Cardiac arrhythmia, unspecified: Secondary | ICD-10-CM | POA: Diagnosis not present

## 2016-02-03 DIAGNOSIS — H3554 Dystrophies primarily involving the retinal pigment epithelium: Secondary | ICD-10-CM | POA: Diagnosis not present

## 2016-03-17 DIAGNOSIS — Z950 Presence of cardiac pacemaker: Secondary | ICD-10-CM | POA: Diagnosis not present

## 2016-03-17 DIAGNOSIS — H3530 Unspecified macular degeneration: Secondary | ICD-10-CM | POA: Diagnosis not present

## 2016-03-17 DIAGNOSIS — G459 Transient cerebral ischemic attack, unspecified: Secondary | ICD-10-CM | POA: Diagnosis not present

## 2016-04-01 DIAGNOSIS — R05 Cough: Secondary | ICD-10-CM | POA: Diagnosis not present

## 2016-04-01 DIAGNOSIS — M545 Low back pain: Secondary | ICD-10-CM | POA: Diagnosis not present

## 2016-04-01 DIAGNOSIS — J209 Acute bronchitis, unspecified: Secondary | ICD-10-CM | POA: Diagnosis not present

## 2016-04-01 DIAGNOSIS — I10 Essential (primary) hypertension: Secondary | ICD-10-CM | POA: Diagnosis not present

## 2016-04-28 DIAGNOSIS — J449 Chronic obstructive pulmonary disease, unspecified: Secondary | ICD-10-CM | POA: Diagnosis not present

## 2016-04-28 DIAGNOSIS — E039 Hypothyroidism, unspecified: Secondary | ICD-10-CM | POA: Diagnosis not present

## 2016-04-28 DIAGNOSIS — N390 Urinary tract infection, site not specified: Secondary | ICD-10-CM | POA: Diagnosis not present

## 2016-04-28 DIAGNOSIS — I10 Essential (primary) hypertension: Secondary | ICD-10-CM | POA: Diagnosis not present

## 2016-04-28 DIAGNOSIS — G4700 Insomnia, unspecified: Secondary | ICD-10-CM | POA: Diagnosis not present

## 2016-05-04 DIAGNOSIS — H402233 Chronic angle-closure glaucoma, bilateral, severe stage: Secondary | ICD-10-CM | POA: Diagnosis not present

## 2016-06-15 DIAGNOSIS — F329 Major depressive disorder, single episode, unspecified: Secondary | ICD-10-CM | POA: Diagnosis not present

## 2016-06-15 DIAGNOSIS — F5102 Adjustment insomnia: Secondary | ICD-10-CM | POA: Diagnosis not present

## 2016-06-15 DIAGNOSIS — I10 Essential (primary) hypertension: Secondary | ICD-10-CM | POA: Diagnosis not present

## 2016-06-15 DIAGNOSIS — I499 Cardiac arrhythmia, unspecified: Secondary | ICD-10-CM | POA: Diagnosis not present

## 2016-06-15 DIAGNOSIS — N390 Urinary tract infection, site not specified: Secondary | ICD-10-CM | POA: Diagnosis not present

## 2016-06-15 DIAGNOSIS — F411 Generalized anxiety disorder: Secondary | ICD-10-CM | POA: Diagnosis not present

## 2016-06-22 DIAGNOSIS — H02403 Unspecified ptosis of bilateral eyelids: Secondary | ICD-10-CM | POA: Diagnosis not present

## 2016-08-04 DIAGNOSIS — H402233 Chronic angle-closure glaucoma, bilateral, severe stage: Secondary | ICD-10-CM | POA: Diagnosis not present

## 2016-08-10 DIAGNOSIS — H402233 Chronic angle-closure glaucoma, bilateral, severe stage: Secondary | ICD-10-CM | POA: Diagnosis not present

## 2016-08-16 DIAGNOSIS — E785 Hyperlipidemia, unspecified: Secondary | ICD-10-CM | POA: Diagnosis not present

## 2016-08-16 DIAGNOSIS — Z950 Presence of cardiac pacemaker: Secondary | ICD-10-CM | POA: Diagnosis not present

## 2016-08-16 DIAGNOSIS — I10 Essential (primary) hypertension: Secondary | ICD-10-CM | POA: Diagnosis not present

## 2016-08-16 DIAGNOSIS — Z01818 Encounter for other preprocedural examination: Secondary | ICD-10-CM | POA: Diagnosis not present

## 2016-08-16 DIAGNOSIS — F329 Major depressive disorder, single episode, unspecified: Secondary | ICD-10-CM | POA: Diagnosis not present

## 2016-08-31 DIAGNOSIS — L259 Unspecified contact dermatitis, unspecified cause: Secondary | ICD-10-CM | POA: Diagnosis not present

## 2016-09-09 DIAGNOSIS — S01101A Unspecified open wound of right eyelid and periocular area, initial encounter: Secondary | ICD-10-CM | POA: Diagnosis not present

## 2016-09-09 DIAGNOSIS — H02105 Unspecified ectropion of left lower eyelid: Secondary | ICD-10-CM | POA: Diagnosis not present

## 2016-09-09 DIAGNOSIS — J45909 Unspecified asthma, uncomplicated: Secondary | ICD-10-CM | POA: Diagnosis not present

## 2016-09-09 DIAGNOSIS — H168 Other keratitis: Secondary | ICD-10-CM | POA: Diagnosis not present

## 2016-09-09 DIAGNOSIS — H02102 Unspecified ectropion of right lower eyelid: Secondary | ICD-10-CM | POA: Diagnosis not present

## 2016-09-09 DIAGNOSIS — H02403 Unspecified ptosis of bilateral eyelids: Secondary | ICD-10-CM | POA: Diagnosis not present

## 2016-09-09 DIAGNOSIS — Z8673 Personal history of transient ischemic attack (TIA), and cerebral infarction without residual deficits: Secondary | ICD-10-CM | POA: Diagnosis not present

## 2016-09-09 DIAGNOSIS — S01102A Unspecified open wound of left eyelid and periocular area, initial encounter: Secondary | ICD-10-CM | POA: Diagnosis not present

## 2016-09-09 DIAGNOSIS — Z882 Allergy status to sulfonamides status: Secondary | ICD-10-CM | POA: Diagnosis not present

## 2016-09-09 DIAGNOSIS — H02132 Senile ectropion of right lower eyelid: Secondary | ICD-10-CM | POA: Diagnosis not present

## 2016-09-09 DIAGNOSIS — H02135 Senile ectropion of left lower eyelid: Secondary | ICD-10-CM | POA: Diagnosis not present

## 2016-09-09 DIAGNOSIS — Z7982 Long term (current) use of aspirin: Secondary | ICD-10-CM | POA: Diagnosis not present

## 2016-09-09 DIAGNOSIS — I10 Essential (primary) hypertension: Secondary | ICD-10-CM | POA: Diagnosis not present

## 2016-09-09 DIAGNOSIS — Z885 Allergy status to narcotic agent status: Secondary | ICD-10-CM | POA: Diagnosis not present

## 2016-09-15 DIAGNOSIS — Z950 Presence of cardiac pacemaker: Secondary | ICD-10-CM | POA: Diagnosis not present

## 2016-09-15 DIAGNOSIS — Z882 Allergy status to sulfonamides status: Secondary | ICD-10-CM | POA: Diagnosis not present

## 2016-09-15 DIAGNOSIS — I495 Sick sinus syndrome: Secondary | ICD-10-CM | POA: Diagnosis not present

## 2016-09-15 DIAGNOSIS — I10 Essential (primary) hypertension: Secondary | ICD-10-CM | POA: Diagnosis not present

## 2016-09-15 DIAGNOSIS — G459 Transient cerebral ischemic attack, unspecified: Secondary | ICD-10-CM | POA: Diagnosis not present

## 2016-10-26 DIAGNOSIS — F5102 Adjustment insomnia: Secondary | ICD-10-CM | POA: Diagnosis not present

## 2016-10-26 DIAGNOSIS — J449 Chronic obstructive pulmonary disease, unspecified: Secondary | ICD-10-CM | POA: Diagnosis not present

## 2016-10-26 DIAGNOSIS — F411 Generalized anxiety disorder: Secondary | ICD-10-CM | POA: Diagnosis not present

## 2016-10-26 DIAGNOSIS — L0390 Cellulitis, unspecified: Secondary | ICD-10-CM | POA: Diagnosis not present

## 2016-10-26 DIAGNOSIS — I10 Essential (primary) hypertension: Secondary | ICD-10-CM | POA: Diagnosis not present

## 2016-10-26 DIAGNOSIS — N390 Urinary tract infection, site not specified: Secondary | ICD-10-CM | POA: Diagnosis not present

## 2016-12-08 DIAGNOSIS — J449 Chronic obstructive pulmonary disease, unspecified: Secondary | ICD-10-CM | POA: Diagnosis not present

## 2016-12-08 DIAGNOSIS — N390 Urinary tract infection, site not specified: Secondary | ICD-10-CM | POA: Diagnosis not present

## 2016-12-08 DIAGNOSIS — F411 Generalized anxiety disorder: Secondary | ICD-10-CM | POA: Diagnosis not present

## 2016-12-08 DIAGNOSIS — G4700 Insomnia, unspecified: Secondary | ICD-10-CM | POA: Diagnosis not present

## 2016-12-08 DIAGNOSIS — I10 Essential (primary) hypertension: Secondary | ICD-10-CM | POA: Diagnosis not present

## 2016-12-08 DIAGNOSIS — M6281 Muscle weakness (generalized): Secondary | ICD-10-CM | POA: Diagnosis not present

## 2016-12-08 DIAGNOSIS — E039 Hypothyroidism, unspecified: Secondary | ICD-10-CM | POA: Diagnosis not present

## 2017-01-02 DIAGNOSIS — F411 Generalized anxiety disorder: Secondary | ICD-10-CM | POA: Diagnosis not present

## 2017-01-02 DIAGNOSIS — I69392 Facial weakness following cerebral infarction: Secondary | ICD-10-CM | POA: Diagnosis not present

## 2017-01-02 DIAGNOSIS — F5102 Adjustment insomnia: Secondary | ICD-10-CM | POA: Diagnosis not present

## 2017-01-02 DIAGNOSIS — J449 Chronic obstructive pulmonary disease, unspecified: Secondary | ICD-10-CM | POA: Diagnosis not present

## 2017-01-02 DIAGNOSIS — N390 Urinary tract infection, site not specified: Secondary | ICD-10-CM | POA: Diagnosis not present

## 2017-01-02 DIAGNOSIS — Z0001 Encounter for general adult medical examination with abnormal findings: Secondary | ICD-10-CM | POA: Diagnosis not present

## 2017-01-02 DIAGNOSIS — I10 Essential (primary) hypertension: Secondary | ICD-10-CM | POA: Diagnosis not present

## 2017-01-02 DIAGNOSIS — I499 Cardiac arrhythmia, unspecified: Secondary | ICD-10-CM | POA: Diagnosis not present

## 2017-01-06 DIAGNOSIS — H402233 Chronic angle-closure glaucoma, bilateral, severe stage: Secondary | ICD-10-CM | POA: Diagnosis not present

## 2017-01-06 DIAGNOSIS — H02055 Trichiasis without entropian left lower eyelid: Secondary | ICD-10-CM | POA: Diagnosis not present

## 2017-01-23 DIAGNOSIS — G4700 Insomnia, unspecified: Secondary | ICD-10-CM | POA: Diagnosis not present

## 2017-01-23 DIAGNOSIS — J449 Chronic obstructive pulmonary disease, unspecified: Secondary | ICD-10-CM | POA: Diagnosis not present

## 2017-01-23 DIAGNOSIS — N390 Urinary tract infection, site not specified: Secondary | ICD-10-CM | POA: Diagnosis not present

## 2017-01-23 DIAGNOSIS — I10 Essential (primary) hypertension: Secondary | ICD-10-CM | POA: Diagnosis not present

## 2017-01-23 DIAGNOSIS — F411 Generalized anxiety disorder: Secondary | ICD-10-CM | POA: Diagnosis not present

## 2017-01-23 DIAGNOSIS — E039 Hypothyroidism, unspecified: Secondary | ICD-10-CM | POA: Diagnosis not present

## 2017-01-23 DIAGNOSIS — M6281 Muscle weakness (generalized): Secondary | ICD-10-CM | POA: Diagnosis not present

## 2017-03-16 DIAGNOSIS — Z950 Presence of cardiac pacemaker: Secondary | ICD-10-CM | POA: Diagnosis not present

## 2017-03-16 DIAGNOSIS — I10 Essential (primary) hypertension: Secondary | ICD-10-CM | POA: Diagnosis not present

## 2017-03-16 DIAGNOSIS — G459 Transient cerebral ischemic attack, unspecified: Secondary | ICD-10-CM | POA: Diagnosis not present

## 2017-03-16 DIAGNOSIS — H3530 Unspecified macular degeneration: Secondary | ICD-10-CM | POA: Diagnosis not present

## 2017-03-16 DIAGNOSIS — I495 Sick sinus syndrome: Secondary | ICD-10-CM | POA: Diagnosis not present

## 2017-04-05 DIAGNOSIS — I10 Essential (primary) hypertension: Secondary | ICD-10-CM | POA: Diagnosis not present

## 2017-04-05 DIAGNOSIS — J069 Acute upper respiratory infection, unspecified: Secondary | ICD-10-CM | POA: Diagnosis not present

## 2017-05-05 ENCOUNTER — Other Ambulatory Visit: Payer: Self-pay | Admitting: Nurse Practitioner

## 2017-05-05 DIAGNOSIS — F409 Phobic anxiety disorder, unspecified: Secondary | ICD-10-CM

## 2017-05-05 DIAGNOSIS — F5105 Insomnia due to other mental disorder: Principal | ICD-10-CM

## 2017-05-05 MED ORDER — ALPRAZOLAM 0.25 MG PO TABS: 0.2500 mg | ORAL_TABLET | Freq: Every evening | ORAL | 2 refills | Status: DC | PRN

## 2017-05-05 NOTE — Progress Notes (Signed)
Renewed rx for alprazolam 0.25mg  at bedtime as needed - #3- with 2 refills

## 2017-05-23 ENCOUNTER — Other Ambulatory Visit: Payer: Self-pay | Admitting: Internal Medicine

## 2017-06-01 ENCOUNTER — Ambulatory Visit (INDEPENDENT_AMBULATORY_CARE_PROVIDER_SITE_OTHER): Payer: Medicare Other | Admitting: Nurse Practitioner

## 2017-06-01 ENCOUNTER — Encounter: Payer: Self-pay | Admitting: Nurse Practitioner

## 2017-06-01 VITALS — BP 142/79 | HR 88 | Temp 96.9°F | Ht 64.0 in | Wt 151.0 lb

## 2017-06-01 DIAGNOSIS — R059 Cough, unspecified: Secondary | ICD-10-CM

## 2017-06-01 DIAGNOSIS — R300 Dysuria: Secondary | ICD-10-CM | POA: Diagnosis not present

## 2017-06-01 DIAGNOSIS — J209 Acute bronchitis, unspecified: Secondary | ICD-10-CM

## 2017-06-01 DIAGNOSIS — F5101 Primary insomnia: Secondary | ICD-10-CM | POA: Diagnosis not present

## 2017-06-01 DIAGNOSIS — F411 Generalized anxiety disorder: Secondary | ICD-10-CM | POA: Diagnosis not present

## 2017-06-01 DIAGNOSIS — F409 Phobic anxiety disorder, unspecified: Secondary | ICD-10-CM | POA: Diagnosis not present

## 2017-06-01 DIAGNOSIS — R05 Cough: Secondary | ICD-10-CM | POA: Diagnosis not present

## 2017-06-01 DIAGNOSIS — N390 Urinary tract infection, site not specified: Secondary | ICD-10-CM

## 2017-06-01 DIAGNOSIS — F5105 Insomnia due to other mental disorder: Secondary | ICD-10-CM | POA: Diagnosis not present

## 2017-06-01 LAB — POCT URINALYSIS DIPSTICK
Bilirubin, UA: NEGATIVE
Blood, UA: NEGATIVE
Glucose, UA: NEGATIVE
KETONES UA: NEGATIVE
NITRITE UA: NEGATIVE
PH UA: 6 (ref 5.0–8.0)
Spec Grav, UA: 1.01 (ref 1.010–1.025)
Urobilinogen, UA: 0.2 E.U./dL

## 2017-06-01 MED ORDER — ALPRAZOLAM 0.25 MG PO TABS: 0.2500 mg | ORAL_TABLET | Freq: Two times a day (BID) | ORAL | 1 refills | Status: DC | PRN

## 2017-06-01 MED ORDER — PROMETHAZINE-CODEINE 6.25-10 MG/5ML PO SYRP: 5.0000 mL | ORAL_SOLUTION | Freq: Three times a day (TID) | ORAL | 0 refills | Status: DC | PRN

## 2017-06-01 MED ORDER — ZOLPIDEM TARTRATE 10 MG PO TABS: 10.0000 mg | ORAL_TABLET | Freq: Every evening | ORAL | 1 refills | Status: DC | PRN

## 2017-06-01 MED ORDER — LEVOFLOXACIN 500 MG PO TABS: 500.0000 mg | ORAL_TABLET | Freq: Every day | ORAL | 0 refills | Status: DC

## 2017-06-01 MED ORDER — PHENAZOPYRIDINE HCL 200 MG PO TABS: 200.0000 mg | ORAL_TABLET | Freq: Three times a day (TID) | ORAL | 0 refills | Status: DC | PRN

## 2017-06-01 NOTE — Progress Notes (Signed)
[facility] [place], [place] [address]  Internal MEDICINE  Office Visit Note  Patient Name: Michele Summers  [id number]  [id number]  Date of Service: 06/01/2017  Chief Complaint  Patient presents with  . Cough  . Urinary Tract Infection     Cough  This is a new problem. The current episode started in the past 7 days. The problem has been unchanged. The problem occurs every few minutes. The cough is non-productive. Associated symptoms include chills, nasal congestion, postnasal drip, rhinorrhea and a sore throat. Pertinent negatives include no fever. The symptoms are aggravated by lying down. She has tried OTC cough suppressant for the symptoms. The treatment provided no relief. Her past medical history is significant for environmental allergies.  Urinary Tract Infection   This is a recurrent problem. The current episode started in the past 7 days. The problem has been rapidly worsening. The quality of the pain is described as burning. The pain is at a severity of 3/10. The pain is moderate. There has been no fever. Associated symptoms include chills, frequency, nausea and urgency. She has tried antibiotics for the symptoms. The treatment provided no relief. Her past medical history is significant for recurrent UTIs.   Pt is here for a sick visit.     Current Medication:  Outpatient Encounter Medications as of 06/01/2017  Medication Sig  . albuterol (PROVENTIL HFA;VENTOLIN HFA) 108 (90 BASE) MCG/ACT inhaler Inhale 2 puffs into the lungs every 6 (six) hours as needed. For shortness of breath  . albuterol-ipratropium (COMBIVENT) 18-103 MCG/ACT inhaler Inhale into the lungs every 4 (four) hours.  . ALPRAZolam (XANAX) 0.25 MG tablet Take 1 tablet (0.25 mg total) by mouth at bedtime as needed for anxiety. 1/2 - 1 tab  . amLODipine (NORVASC) 5 MG tablet TAKE ONE TABLET BY MOUTH AT BEDTIME FOR HTN  . brimonidine-timolol (COMBIGAN) 0.2-0.5 % ophthalmic solution  Place 1 drop into both eyes every 12 (twelve) hours.  Marland Kitchen FLUoxetine (PROZAC) 20 MG capsule Take 20 mg by mouth daily. AM  . hydrALAZINE (APRESOLINE) 25 MG tablet Take 25 mg by mouth daily.  Marland Kitchen HYDROcodone-acetaminophen (NORCO/VICODIN) 5-325 MG tablet Take 1 tablet by mouth every 6 (six) hours as needed for moderate pain.  Marland Kitchen KRILL OIL PO Take by mouth.  . montelukast (SINGULAIR) 10 MG tablet Take 10 mg by mouth daily. AM  . Multiple Vitamins-Minerals (PRESERVISION AREDS PO) Take by mouth. AM  . mupirocin ointment (BACTROBAN) 2 % Place 1 application into the nose 2 (two) times daily.  . vitamin B-12 (CYANOCOBALAMIN) 500 MCG tablet Take 500 mcg by mouth daily.  Marland Kitchen zolpidem (AMBIEN) 10 MG tablet Take 10 mg by mouth at bedtime as needed for sleep.  Marland Kitchen amLODipine (NORVASC) 2.5 MG tablet Take 5 mg by mouth daily. PM  . atorvastatin (LIPITOR) 10 MG tablet Take 10 mg by mouth daily. AM  . Difluprednate (DUREZOL) 0.05 % EMUL Apply to eye.  . moxifloxacin (VIGAMOX) 0.5 % ophthalmic solution 1 drop 3 (three) times daily.  . nepafenac (ILEVRO) 0.3 % ophthalmic suspension 1 drop daily.  Marland Kitchen oxyCODONE-acetaminophen (PERCOCET) 5-325 MG tablet Take 2 tablets by mouth every 6 (six) hours as needed for moderate pain or severe pain. (Patient not taking: Reported on 06/01/2017)  . rivaroxaban 20 MG TABS Take 20 mg by mouth daily. (Patient taking differently: Take 20 mg by mouth daily. AM)  . senna (SENOKOT) 8.6 MG TABS Take 1 tablet (8.6 mg total) by mouth 2 (two)  times daily.  . travoprost, benzalkonium, (TRAVATAN) 0.004 % ophthalmic solution 1 drop at bedtime.   No facility-administered encounter medications on file as of 06/01/2017.       Medical History: Past Medical History:  Diagnosis Date  . Arthritis    Hands, feet, jaw  . Asthma   . CHF (congestive heart failure) ([place])   . Depression   . DVT (deep venous thrombosis) ([place])   . Dysrhythmia    A-FIB  . Fracture of hip ([place])    right  . Full dentures     UPPER AND LOWER  . Headache(784.0)   . Hypercholesterolemia   . Hypertension    CONTROLLED ON MEDS  . PE (pulmonary embolism)   . Presence of permanent cardiac pacemaker   . Stroke (cerebrum) ([facility])    DIFFICULT FOR HER TO COMPLETE HER SENTENCES,RESPONDS SLOWLY  . Stroke [facility]) 2006   CVA-CEREBRAL ANEURYSM  . UTI (urinary tract infection)    FREQUENT     Today's Vitals   06/01/17 1547  BP: (!) 142/79  Pulse: 88  Temp: (!) 96.9 F (36.1 C)  SpO2: 97%  Weight: 151 lb (68.5 kg)  Height: 5\' 4"  (1.626 m)    Review of Systems  Constitutional: Positive for chills and fatigue. Negative for fever.  HENT: Positive for congestion, postnasal drip, rhinorrhea and sore throat.   Respiratory: Positive for cough.   Gastrointestinal: Positive for nausea.  Genitourinary: Positive for frequency and urgency.  Musculoskeletal: Positive for back pain.  Allergic/Immunologic: Positive for environmental allergies.    Physical Exam  Constitutional: She is oriented to person, place, and time. She appears well-developed and well-nourished. No distress.  HENT:  Head: Normocephalic and atraumatic.  Nose: Rhinorrhea present.  Mouth/Throat: Posterior oropharyngeal erythema present. No oropharyngeal exudate.  Eyes: EOM are normal. Pupils are equal, round, and reactive to light.  Neck: Normal range of motion. Neck supple. No JVD present. No tracheal deviation present. No thyromegaly present.  Cardiovascular: Normal rate. An irregular rhythm present. Exam reveals no gallop and no friction rub.  Murmur heard.  Systolic murmur is present with a grade of 2/6. Pulmonary/Chest: Effort normal. No accessory muscle usage. No respiratory distress. She has no wheezes. She has no rales. She exhibits no tenderness.  Non-productive cough is present  Abdominal: Soft. Bowel sounds are normal.  Genitourinary:  Genitourinary Comments: Urine sample is positive for moderate infection  Musculoskeletal: Normal range  of motion.  Lymphadenopathy:    She has no cervical adenopathy.  Neurological: She is alert and oriented to person, place, and time. No cranial nerve deficit.  She is at her neurological baseline .  Skin: Skin is warm and dry. She is not diaphoretic.  Psychiatric: She has a normal mood and affect. Her behavior is normal. Judgment and thought content normal.      Assessment/Plan: 1. Dysuria -urine sample positive for large WBC - POCT Urinalysis Dipstick - phenazopyridine (PYRIDIUM) 200 MG tablet; Take 1 tablet (200 mg total) by mouth 3 (three) times daily as needed for pain.  Dispense: 10 tablet; Refill: 0  2. Urinary tract infection without hematuria, site unspecified - CULTURE, URINE COMPREHENSIVE - levofloxacin (LEVAQUIN) 500 MG tablet; Take 1 tablet (500 mg total) by mouth daily.  Dispense: 10 tablet; Refill: 0  3. Acute bronchitis, unspecified organism Levofloxacin 500mg  daily for 10 days to cover bacterial causes of acute bronchitis.   4. Cough - promethazine-codeine (PHENERGAN WITH CODEINE) 6.25-10 MG/5ML syrup; Take 5 mLs by mouth every 8 (  eight) hours as needed for cough.  Dispense: 180 mL; Refill: 0  5. GAD (generalized anxiety disorder) Continue fluoxetine every day. May take alprazolam 0.25mg  twice daily if needed. Written rx given for 90 day prescription for patient to send to [place].   6. Primary insomnia Zolpidem 10mg  may be continued. Patient monitored for side effects. 90 day prescription given to send to [facility] for fill.   7. Insomnia due to anxiety and fear - zolpidem (AMBIEN) 10 MG tablet; Take 1 tablet (10 mg total) by mouth at bedtime as needed for sleep.  Dispense: 90 tablet; Refill: 1 - ALPRAZolam (XANAX) 0.25 MG tablet; Take 1 tablet (0.25 mg total) by mouth 2 (two) times daily as needed for anxiety. 1/2 - 1 tab  Dispense: 180 tablet; Refill: 1  General Counseling: Caylie verbalizes understanding of the findings of todays visit and agrees with plan of treatment. I  have discussed any further diagnostic evaluation that may be needed or ordered today. We also reviewed her medications today. she has been encouraged to call the office with any questions or concerns that should arise related to todays visit.   This patient was seen by Leretha Pol, FNP- C in Collaboration with Dr [name] as a part of collaborative care agreement    Orders Placed This Encounter  Procedures  . CULTURE, URINE COMPREHENSIVE  . POCT Urinalysis Dipstick    Time spent: 20 Minutes

## 2017-06-04 LAB — CULTURE, URINE COMPREHENSIVE

## 2017-06-13 ENCOUNTER — Encounter: Payer: Self-pay | Admitting: Nurse Practitioner

## 2017-06-13 ENCOUNTER — Ambulatory Visit (INDEPENDENT_AMBULATORY_CARE_PROVIDER_SITE_OTHER): Payer: Medicare Other | Admitting: Nurse Practitioner

## 2017-06-13 VITALS — BP 120/70 | HR 71 | Resp 16 | Ht 64.0 in | Wt 155.0 lb

## 2017-06-13 DIAGNOSIS — F5101 Primary insomnia: Secondary | ICD-10-CM

## 2017-06-13 DIAGNOSIS — F411 Generalized anxiety disorder: Secondary | ICD-10-CM

## 2017-06-13 DIAGNOSIS — J209 Acute bronchitis, unspecified: Secondary | ICD-10-CM | POA: Insufficient documentation

## 2017-06-13 DIAGNOSIS — N390 Urinary tract infection, site not specified: Secondary | ICD-10-CM | POA: Diagnosis not present

## 2017-06-13 DIAGNOSIS — I10 Essential (primary) hypertension: Secondary | ICD-10-CM | POA: Diagnosis not present

## 2017-06-13 NOTE — Progress Notes (Addendum)
[facility] [place], [place] [address]  Internal MEDICINE  Office Visit Note  Patient Name: Michele Summers  [id number]  [id number]  Date of Service: 06/25/2017  Chief Complaint  Patient presents with  . Urinary Tract Infection    Urinary Tract Infection   This is a recurrent problem. The current episode started 1 to 4 weeks ago. The problem has been rapidly improving. The quality of the pain is described as burning. The pain is at a severity of 0/10. The patient is experiencing no pain. There has been no fever. Pertinent negatives include no chills, frequency, nausea or urgency. She has tried antibiotics for the symptoms. The treatment provided moderate relief. Her past medical history is significant for recurrent UTIs.    Pt is here for routine follow up.    Current Medication: Outpatient Encounter Medications as of 06/13/2017  Medication Sig  . albuterol (PROVENTIL HFA;VENTOLIN HFA) 108 (90 BASE) MCG/ACT inhaler Inhale 2 puffs into the lungs every 6 (six) hours as needed. For shortness of breath  . albuterol-ipratropium (COMBIVENT) 18-103 MCG/ACT inhaler Inhale into the lungs every 4 (four) hours.  . ALPRAZolam (XANAX) 0.25 MG tablet Take 1 tablet (0.25 mg total) by mouth 2 (two) times daily as needed for anxiety. 1/2 - 1 tab  . amLODipine (NORVASC) 5 MG tablet TAKE ONE TABLET BY MOUTH AT BEDTIME FOR HTN  . atorvastatin (LIPITOR) 10 MG tablet Take 10 mg by mouth daily. AM  . brimonidine-timolol (COMBIGAN) 0.2-0.5 % ophthalmic solution Place 1 drop into both eyes every 12 (twelve) hours.  . Difluprednate (DUREZOL) 0.05 % EMUL Apply to eye.  Marland Kitchen FLUoxetine (PROZAC) 20 MG capsule Take 20 mg by mouth daily. AM  . hydrALAZINE (APRESOLINE) 25 MG tablet Take 25 mg by mouth daily.  Marland Kitchen HYDROcodone-acetaminophen (NORCO/VICODIN) 5-325 MG tablet Take 1 tablet by mouth every 6 (six) hours as needed for moderate pain.  Marland Kitchen KRILL OIL PO Take by mouth.  . levofloxacin  (LEVAQUIN) 500 MG tablet Take 1 tablet (500 mg total) by mouth daily.  . montelukast (SINGULAIR) 10 MG tablet Take 10 mg by mouth daily. AM  . moxifloxacin (VIGAMOX) 0.5 % ophthalmic solution 1 drop 3 (three) times daily.  . Multiple Vitamins-Minerals (PRESERVISION AREDS PO) Take by mouth. AM  . mupirocin ointment (BACTROBAN) 2 % Place 1 application into the nose 2 (two) times daily.  . nepafenac (ILEVRO) 0.3 % ophthalmic suspension 1 drop daily.  Marland Kitchen oxyCODONE-acetaminophen (PERCOCET) 5-325 MG tablet Take 2 tablets by mouth every 6 (six) hours as needed for moderate pain or severe pain. (Patient not taking: Reported on 06/01/2017)  . phenazopyridine (PYRIDIUM) 200 MG tablet Take 1 tablet (200 mg total) by mouth 3 (three) times daily as needed for pain.  . promethazine-codeine (PHENERGAN WITH CODEINE) 6.25-10 MG/5ML syrup Take 5 mLs by mouth every 8 (eight) hours as needed for cough.  . rivaroxaban 20 MG TABS Take 20 mg by mouth daily. (Patient taking differently: Take 20 mg by mouth daily. AM)  . senna (SENOKOT) 8.6 MG TABS Take 1 tablet (8.6 mg total) by mouth 2 (two) times daily.  . travoprost, benzalkonium, (TRAVATAN) 0.004 % ophthalmic solution 1 drop at bedtime.  . vitamin B-12 (CYANOCOBALAMIN) 500 MCG tablet Take 500 mcg by mouth daily.  Marland Kitchen zolpidem (AMBIEN) 10 MG tablet Take 1 tablet (10 mg total) by mouth at bedtime as needed for sleep.   No facility-administered encounter medications on file as of 06/13/2017.  Surgical History: Past Surgical History:  Procedure Laterality Date  . ABDOMINAL HYSTERECTOMY    . BLADDER SURGERY    . BRAIN SURGERY    . CARDIAC CATHETERIZATION    . CATARACT EXTRACTION W/PHACO Left 09/15/2014   Procedure: CATARACT EXTRACTION PHACO AND INTRAOCULAR LENS PLACEMENT (IOC);  Surgeon: Ronnell Freshwater, MD;  Location: [place];  Service: Ophthalmology;  Laterality: Left;  . CATARACT EXTRACTION W/PHACO Right 03/23/2015   Procedure: CATARACT  EXTRACTION PHACO AND INTRAOCULAR LENS PLACEMENT (IOC);  Surgeon: Ronnell Freshwater, MD;  Location: [place];  Service: Ophthalmology;  Laterality: Right;  . CHOLECYSTECTOMY    . CRANIOTOMY    . INSERT / REPLACE / REMOVE PACEMAKER    . JOINT REPLACEMENT     TOTAL HIP    Medical History: Past Medical History:  Diagnosis Date  . Arthritis    Hands, feet, jaw  . Asthma   . CHF (congestive heart failure) ([place])   . Depression   . DVT (deep venous thrombosis) ([place])   . Dysrhythmia    A-FIB  . Fracture of hip ([place])    right  . Full dentures    UPPER AND LOWER  . Headache(784.0)   . Hypercholesterolemia   . Hypertension    CONTROLLED ON MEDS  . PE (pulmonary embolism)   . Presence of permanent cardiac pacemaker   . Stroke (cerebrum) ([facility])    DIFFICULT FOR HER TO COMPLETE HER SENTENCES,RESPONDS SLOWLY  . Stroke [facility]) 2006   CVA-CEREBRAL ANEURYSM  . UTI (urinary tract infection)    FREQUENT    Family History: No family history on file.  Social History   Socioeconomic History  . Marital status: Married    Spouse name: Not on file  . Number of children: Not on file  . Years of education: Not on file  . Highest education level: Not on file  Social Needs  . Financial resource strain: Not on file  . Food insecurity - worry: Not on file  . Food insecurity - inability: Not on file  . Transportation needs - medical: Not on file  . Transportation needs - non-medical: Not on file  Occupational History  . Not on file  Tobacco Use  . Smoking status: Never Smoker  . Smokeless tobacco: Never Used  Substance and Sexual Activity  . Alcohol use: No  . Drug use: No  . Sexual activity: No  Other Topics Concern  . Not on file  Social History Narrative  . Not on file      Review of Systems  Constitutional: Negative for activity change, chills, fatigue and fever.  HENT: Negative for congestion, postnasal drip, rhinorrhea and sore throat.   Eyes: Negative for  visual disturbance.  Respiratory: Negative for cough and chest tightness.   Cardiovascular: Negative for chest pain, palpitations and leg swelling.  Gastrointestinal: Negative for abdominal pain, constipation, diarrhea and nausea.  Endocrine: Negative for cold intolerance, heat intolerance, polydipsia, polyphagia and polyuria.  Genitourinary: Negative for frequency and urgency.       Dysuria improved since antibiotic treatment. Last antibiotic taken last night.  Musculoskeletal: Positive for back pain.  Allergic/Immunologic: Positive for environmental allergies.  Neurological: Positive for weakness.  Hematological: Negative for adenopathy. Does not bruise/bleed easily.  Psychiatric/Behavioral: Positive for sleep disturbance. The patient is nervous/anxious.     Today's Vitals   06/13/17 1424  BP: 120/70  Pulse: 71  Resp: 16  SpO2: 94%  Weight: 155 lb (70.3 kg)  Height:  5\' 4"  (1.626 m)    Physical Exam  Constitutional: She is oriented to person, place, and time. She appears well-developed and well-nourished.  HENT:  Head: Normocephalic and atraumatic.  Eyes: EOM are normal. Pupils are equal, round, and reactive to light.  Neck: Normal range of motion. Neck supple. No thyromegaly present.  Cardiovascular: Normal rate. An irregular rhythm present.  Murmur heard.  Systolic murmur is present with a grade of 2/6. Pulmonary/Chest: Effort normal and breath sounds normal. She has no wheezes.  Abdominal: Soft. Bowel sounds are normal. There is no tenderness.  Genitourinary:  Genitourinary Comments: Urine sample is without wbc or other abnormalities.   Musculoskeletal: Normal range of motion.  Lymphadenopathy:    She has no cervical adenopathy.  Neurological: She is alert and oriented to person, place, and time.  She is at her neurological basseline.   Skin: Skin is warm and dry.  Psychiatric: She has a normal mood and affect. Her behavior is normal. Judgment and thought content  normal.  Nursing note and vitals reviewed.  Assessment/Plan: 1. Urinary tract infection without hematuria, site unspecified - POCT urinalysis dipstick. Resolved. U/a negative for WBC or other abnormalitiess today  2. Acute bronchitis, unspecified organism Resolved   3. Essential hypertension Stable. Continue bp medications as prescribed   4. GAD (generalized anxiety disorder) May continue alprazolam as needed and as prescribed. A new, writtenn rx was given for her to mail to [place]   5. Primary insomnia May coitnue zolpidem as prescribed. A [facility], written [facility]   General Counseling: Gaylyn Rong understanding of the findings of todays visit and agrees with plan of treatment. I have discussed any further diagnostic evaluation that may be needed or ordered today. We also reviewed her medications today. she has been encouraged to call the office with any questions or concerns that should arise related to todays visit.   This patient was seen by Leretha Pol, FNP- C in Collaboration with Dr [name] as a part of collaborative care agreement    Orders Placed This Encounter  Procedures  . POCT urinalysis dipstick      Time spent: 15 Minutes  Dr [name] Internal medicine

## 2017-06-14 LAB — POCT URINALYSIS DIPSTICK
Bilirubin, UA: NEGATIVE
Blood, UA: NEGATIVE
Glucose, UA: NEGATIVE
Ketones, UA: NEGATIVE
Leukocytes, UA: NEGATIVE
NITRITE UA: NEGATIVE
PH UA: 6 (ref 5.0–8.0)
PROTEIN UA: NEGATIVE
Spec Grav, UA: 1.01 (ref 1.010–1.025)
Urobilinogen, UA: 0.2 E.U./dL

## 2017-06-22 DIAGNOSIS — I495 Sick sinus syndrome: Secondary | ICD-10-CM | POA: Diagnosis not present

## 2017-06-22 DIAGNOSIS — Z950 Presence of cardiac pacemaker: Secondary | ICD-10-CM | POA: Diagnosis not present

## 2017-06-22 DIAGNOSIS — Z882 Allergy status to sulfonamides status: Secondary | ICD-10-CM | POA: Diagnosis not present

## 2017-06-22 DIAGNOSIS — I10 Essential (primary) hypertension: Secondary | ICD-10-CM | POA: Diagnosis not present

## 2017-06-22 DIAGNOSIS — G459 Transient cerebral ischemic attack, unspecified: Secondary | ICD-10-CM | POA: Diagnosis not present

## 2017-06-25 DIAGNOSIS — I10 Essential (primary) hypertension: Secondary | ICD-10-CM | POA: Insufficient documentation

## 2017-06-28 ENCOUNTER — Other Ambulatory Visit: Payer: Self-pay

## 2017-06-28 DIAGNOSIS — R300 Dysuria: Secondary | ICD-10-CM

## 2017-06-28 MED ORDER — PHENAZOPYRIDINE HCL 200 MG PO TABS: 200.0000 mg | ORAL_TABLET | Freq: Three times a day (TID) | ORAL | 0 refills | Status: DC | PRN

## 2017-07-03 ENCOUNTER — Other Ambulatory Visit: Payer: Self-pay

## 2017-07-03 DIAGNOSIS — R300 Dysuria: Secondary | ICD-10-CM

## 2017-07-03 MED ORDER — PHENAZOPYRIDINE HCL 200 MG PO TABS: 200.0000 mg | ORAL_TABLET | Freq: Three times a day (TID) | ORAL | 0 refills | Status: DC | PRN

## 2017-07-05 DIAGNOSIS — H402233 Chronic angle-closure glaucoma, bilateral, severe stage: Secondary | ICD-10-CM | POA: Diagnosis not present

## 2017-07-05 DIAGNOSIS — H02055 Trichiasis without entropian left lower eyelid: Secondary | ICD-10-CM | POA: Diagnosis not present

## 2017-08-02 ENCOUNTER — Other Ambulatory Visit: Payer: Self-pay | Admitting: Nurse Practitioner

## 2017-08-02 DIAGNOSIS — F409 Phobic anxiety disorder, unspecified: Secondary | ICD-10-CM

## 2017-08-02 DIAGNOSIS — F5105 Insomnia due to other mental disorder: Principal | ICD-10-CM

## 2017-08-02 MED ORDER — ZOLPIDEM TARTRATE 10 MG PO TABS: 10.0000 mg | ORAL_TABLET | Freq: Every evening | ORAL | 1 refills | Status: DC | PRN

## 2017-08-02 NOTE — Progress Notes (Signed)
Refilled patient's zolpidem 10mg  [facility] prn with 90 day prescription and sent to [facility] as requested .

## 2017-08-28 ENCOUNTER — Other Ambulatory Visit: Payer: Self-pay | Admitting: Nurse Practitioner

## 2017-08-28 ENCOUNTER — Telehealth: Payer: Self-pay

## 2017-08-28 DIAGNOSIS — R300 Dysuria: Secondary | ICD-10-CM

## 2017-08-28 DIAGNOSIS — N390 Urinary tract infection, site not specified: Secondary | ICD-10-CM

## 2017-08-28 MED ORDER — PHENAZOPYRIDINE HCL 200 MG PO TABS: 200.0000 mg | ORAL_TABLET | Freq: Three times a day (TID) | ORAL | 0 refills | Status: DC | PRN

## 2017-08-28 MED ORDER — LEVOFLOXACIN 500 MG PO TABS: 500.0000 mg | ORAL_TABLET | Freq: Every day | ORAL | 0 refills | Status: DC

## 2017-08-28 NOTE — Telephone Encounter (Signed)
Patient c/o uti. Sent levofloxacin 500mg  daily for 10 days. Also renewed pyridium 200mg  three times daily if needed for bladder pain/spasms. Both sent to [facility].

## 2017-08-28 NOTE — Progress Notes (Signed)
Patient c/o uti. Sent levofloxacin 500mg  daily for 10 days. Also renewed pyridium 200mg  three times daily if needed for bladder pain/spasms. Both sent to [facility].

## 2017-08-28 NOTE — Telephone Encounter (Signed)
PT DAUGHTER CALLED FOR UTI HEATHER SEND LEVAQUIN AND PYRIDIUM AND PT AWARE

## 2017-09-14 ENCOUNTER — Encounter: Payer: Self-pay | Admitting: Nurse Practitioner

## 2017-09-14 ENCOUNTER — Ambulatory Visit (INDEPENDENT_AMBULATORY_CARE_PROVIDER_SITE_OTHER): Payer: Medicare Other | Admitting: Nurse Practitioner

## 2017-09-14 VITALS — BP 131/68 | HR 79 | Resp 16 | Ht 64.0 in | Wt 156.2 lb

## 2017-09-14 DIAGNOSIS — F409 Phobic anxiety disorder, unspecified: Secondary | ICD-10-CM | POA: Diagnosis not present

## 2017-09-14 DIAGNOSIS — R300 Dysuria: Secondary | ICD-10-CM

## 2017-09-14 DIAGNOSIS — N390 Urinary tract infection, site not specified: Secondary | ICD-10-CM | POA: Diagnosis not present

## 2017-09-14 DIAGNOSIS — G459 Transient cerebral ischemic attack, unspecified: Secondary | ICD-10-CM | POA: Diagnosis not present

## 2017-09-14 DIAGNOSIS — R319 Hematuria, unspecified: Secondary | ICD-10-CM | POA: Diagnosis not present

## 2017-09-14 DIAGNOSIS — Z885 Allergy status to narcotic agent status: Secondary | ICD-10-CM | POA: Diagnosis not present

## 2017-09-14 DIAGNOSIS — I495 Sick sinus syndrome: Secondary | ICD-10-CM | POA: Diagnosis not present

## 2017-09-14 DIAGNOSIS — F5105 Insomnia due to other mental disorder: Secondary | ICD-10-CM

## 2017-09-14 DIAGNOSIS — I10 Essential (primary) hypertension: Secondary | ICD-10-CM | POA: Diagnosis not present

## 2017-09-14 DIAGNOSIS — Z950 Presence of cardiac pacemaker: Secondary | ICD-10-CM | POA: Diagnosis not present

## 2017-09-14 DIAGNOSIS — H3530 Unspecified macular degeneration: Secondary | ICD-10-CM | POA: Diagnosis not present

## 2017-09-14 DIAGNOSIS — N959 Unspecified menopausal and perimenopausal disorder: Secondary | ICD-10-CM

## 2017-09-14 LAB — POCT URINALYSIS DIPSTICK
Glucose, UA: NEGATIVE
LEUKOCYTES UA: NEGATIVE
NITRITE UA: NEGATIVE
PH UA: 6 (ref 5.0–8.0)
SPEC GRAV UA: 1.01 (ref 1.010–1.025)
UROBILINOGEN UA: 0.2 U/dL

## 2017-09-14 MED ORDER — ALPRAZOLAM 0.25 MG PO TABS: 0.2500 mg | ORAL_TABLET | Freq: Three times a day (TID) | ORAL | 1 refills | Status: DC | PRN

## 2017-09-14 MED ORDER — LEVOFLOXACIN 500 MG PO TABS: 500.0000 mg | ORAL_TABLET | Freq: Every day | ORAL | 0 refills | Status: DC

## 2017-09-14 MED ORDER — ZOLPIDEM TARTRATE 10 MG PO TABS: 10.0000 mg | ORAL_TABLET | Freq: Every evening | ORAL | 1 refills | Status: DC | PRN

## 2017-09-14 MED ORDER — ESTROGENS, CONJUGATED 0.625 MG/GM VA CREA: 1.0000 | TOPICAL_CREAM | VAGINAL | 11 refills | Status: DC

## 2017-09-14 NOTE — Progress Notes (Signed)
[facility] [place], [place] [address]  Internal MEDICINE  Office Visit Note  Patient Name: Michele Summers  [id number]  [id number]  Date of Service: 10/04/2017   Pt is here for routine follow up.   Chief Complaint  Patient presents with  . Urinary Tract Infection    recurrent    Urinary Tract Infection   This is a recurrent problem. The current episode started more than 1 year ago. The problem occurs intermittently. The problem has been waxing and waning. There has been no fever. She is not sexually active. There is no history of pyelonephritis. Associated symptoms include frequency and hematuria. Pertinent negatives include no chills, nausea or vomiting. She has tried antibiotics for the symptoms. The treatment provided significant relief. Her past medical history is significant for recurrent UTIs.       Current Medication: Outpatient Encounter Medications as of 09/14/2017  Medication Sig  . albuterol (PROVENTIL HFA;VENTOLIN HFA) 108 (90 BASE) MCG/ACT inhaler Inhale 2 puffs into the lungs every 6 (six) hours as needed. For shortness of breath  . albuterol-ipratropium (COMBIVENT) 18-103 MCG/ACT inhaler Inhale into the lungs every 4 (four) hours.  . ALPRAZolam (XANAX) 0.25 MG tablet Take 1 tablet (0.25 mg total) by mouth 3 (three) times daily as needed for anxiety.  Marland Kitchen amLODipine (NORVASC) 5 MG tablet TAKE ONE TABLET BY MOUTH AT BEDTIME FOR HTN  . atorvastatin (LIPITOR) 10 MG tablet Take 10 mg by mouth daily. AM  . brimonidine-timolol (COMBIGAN) 0.2-0.5 % ophthalmic solution Place 1 drop into both eyes every 12 (twelve) hours.  . conjugated estrogens (PREMARIN) vaginal cream Place 1 Applicatorful vaginally 2 (two) times a week.  . Difluprednate (DUREZOL) 0.05 % EMUL Apply to eye.  Marland Kitchen FLUoxetine (PROZAC) 20 MG capsule Take 20 mg by mouth daily. AM  . hydrALAZINE (APRESOLINE) 25 MG tablet Take 25 mg by mouth daily.  Marland Kitchen HYDROcodone-acetaminophen (NORCO/VICODIN)  5-325 MG tablet Take 1 tablet by mouth every 6 (six) hours as needed for moderate pain.  Marland Kitchen KRILL OIL PO Take by mouth.  . levofloxacin (LEVAQUIN) 500 MG tablet Take 1 tablet (500 mg total) by mouth daily.  . montelukast (SINGULAIR) 10 MG tablet Take 10 mg by mouth daily. AM  . moxifloxacin (VIGAMOX) 0.5 % ophthalmic solution 1 drop 3 (three) times daily.  . Multiple Vitamins-Minerals (PRESERVISION AREDS PO) Take by mouth. AM  . mupirocin ointment (BACTROBAN) 2 % Place 1 application into the nose 2 (two) times daily.  . nepafenac (ILEVRO) 0.3 % ophthalmic suspension 1 drop daily.  Marland Kitchen oxyCODONE-acetaminophen (PERCOCET) 5-325 MG tablet Take 2 tablets by mouth every 6 (six) hours as needed for moderate pain or severe pain. (Patient not taking: Reported on 06/01/2017)  . phenazopyridine (PYRIDIUM) 200 MG tablet Take 1 tablet (200 mg total) by mouth 3 (three) times daily as needed for pain.  . promethazine-codeine (PHENERGAN WITH CODEINE) 6.25-10 MG/5ML syrup Take 5 mLs by mouth every 8 (eight) hours as needed for cough.  . rivaroxaban 20 MG TABS Take 20 mg by mouth daily. (Patient taking differently: Take 20 mg by mouth daily. AM)  . senna (SENOKOT) 8.6 MG TABS Take 1 tablet (8.6 mg total) by mouth 2 (two) times daily.  . travoprost, benzalkonium, (TRAVATAN) 0.004 % ophthalmic solution 1 drop at bedtime.  . vitamin B-12 (CYANOCOBALAMIN) 500 MCG tablet Take 500 mcg by mouth daily.  Marland Kitchen zolpidem (AMBIEN) 10 MG tablet Take 1 tablet (10 mg total) by mouth at bedtime as needed  for sleep.  . [DISCONTINUED] ALPRAZolam (XANAX) 0.25 MG tablet Take 1 tablet (0.25 mg total) by mouth 2 (two) times daily as needed for anxiety. 1/2 - 1 tab  . [DISCONTINUED] levofloxacin (LEVAQUIN) 500 MG tablet Take 1 tablet (500 mg total) by mouth daily.  . [DISCONTINUED] zolpidem (AMBIEN) 10 MG tablet Take 1 tablet (10 mg total) by mouth at bedtime as needed for sleep.   No facility-administered encounter medications on file as of  09/14/2017.     Surgical History: Past Surgical History:  Procedure Laterality Date  . ABDOMINAL HYSTERECTOMY    . BLADDER SURGERY    . BRAIN SURGERY    . CARDIAC CATHETERIZATION    . CATARACT EXTRACTION W/PHACO Left 09/15/2014   Procedure: CATARACT EXTRACTION PHACO AND INTRAOCULAR LENS PLACEMENT (IOC);  Surgeon: Ronnell Freshwater, MD;  Location: [place];  Service: Ophthalmology;  Laterality: Left;  . CATARACT EXTRACTION W/PHACO Right 03/23/2015   Procedure: CATARACT EXTRACTION PHACO AND INTRAOCULAR LENS PLACEMENT (IOC);  Surgeon: Ronnell Freshwater, MD;  Location: [facility];  Service: Ophthalmology;  Laterality: Right;  . CHOLECYSTECTOMY    . CRANIOTOMY    . INSERT / REPLACE / REMOVE PACEMAKER    . JOINT REPLACEMENT     TOTAL HIP    Medical History: Past Medical History:  Diagnosis Date  . Arthritis    Hands, feet, jaw  . Asthma   . CHF (congestive heart failure) ([place])   . Depression   . DVT (deep venous thrombosis) ([place])   . Dysrhythmia    A-FIB  . Fracture of hip ([place])    right  . Full dentures    UPPER AND LOWER  . Headache(784.0)   . Hypercholesterolemia   . Hypertension    CONTROLLED ON MEDS  . PE (pulmonary embolism)   . Presence of permanent cardiac pacemaker   . Stroke (cerebrum) ([facility])    DIFFICULT FOR HER TO COMPLETE HER SENTENCES,RESPONDS SLOWLY  . Stroke [facility]) 2006   CVA-CEREBRAL ANEURYSM  . UTI (urinary tract infection)    FREQUENT    Family History: History reviewed. No pertinent family history.  Social History   Socioeconomic History  . Marital status: Married    Spouse name: Not on file  . Number of children: Not on file  . Years of education: Not on file  . Highest education level: Not on file  Occupational History  . Not on file  Social Needs  . Financial resource strain: Not on file  . Food insecurity:    Worry: Not on file    Inability: Not on file  . Transportation needs:    Medical: Not on file     Non-medical: Not on file  Tobacco Use  . Smoking status: Never Smoker  . Smokeless tobacco: Never Used  Substance and Sexual Activity  . Alcohol use: No  . Drug use: No  . Sexual activity: Never  Lifestyle  . Physical activity:    Days per week: Not on file    Minutes per session: Not on file  . Stress: Not on file  Relationships  . Social connections:    Talks on phone: Not on file    Gets together: Not on file    Attends religious service: Not on file    Active member of club or organization: Not on file    Attends meetings of clubs or organizations: Not on file    Relationship status: Not on file  . Intimate partner violence:  Fear of current or ex partner: Not on file    Emotionally abused: Not on file    Physically abused: Not on file    Forced sexual activity: Not on file  Other Topics Concern  . Not on file  Social History Narrative  . Not on file      Review of Systems  Constitutional: Negative for activity change, chills, fatigue and unexpected weight change.  HENT: Positive for postnasal drip. Negative for congestion, rhinorrhea, sneezing and sore throat.   Eyes: Negative for redness.  Respiratory: Negative for cough, chest tightness and shortness of breath.   Cardiovascular: Negative for chest pain and palpitations.  Gastrointestinal: Negative for abdominal pain, constipation, diarrhea, nausea and vomiting.  Genitourinary: Positive for dysuria, frequency and hematuria.  Musculoskeletal: Negative for arthralgias, back pain, joint swelling and neck pain.  Skin: Negative for rash.  Neurological: Positive for weakness. Negative for tremors and numbness.  Hematological: Negative for adenopathy. Does not bruise/bleed easily.  Psychiatric/Behavioral: Negative for behavioral problems (Depression), sleep disturbance and suicidal ideas. The patient is not nervous/anxious.    Today's Vitals   09/14/17 1401  BP: 131/68  Pulse: 79  Resp: 16  SpO2: 95%  Weight:  156 lb 3.2 oz (70.9 kg)  Height: 5\' 4"  (1.626 m)    Physical Exam  Constitutional: She is oriented to person, place, and time. She appears well-developed and well-nourished.  HENT:  Head: Normocephalic and atraumatic.  Eyes: Pupils are equal, round, and reactive to light. EOM are normal.  Neck: Normal range of motion. Neck supple. No thyromegaly present.  Cardiovascular: Normal rate. An irregular rhythm present.  Murmur heard.  Systolic murmur is present with a grade of 2/6. Pulmonary/Chest: Effort normal and breath sounds normal. She has no wheezes.  Abdominal: Soft. Bowel sounds are normal. There is no tenderness.  Genitourinary:  Genitourinary Comments: Urine samle positive for trace protein and moderate blood .  Musculoskeletal: Normal range of motion.  Lymphadenopathy:    She has no cervical adenopathy.  Neurological: She is alert and oriented to person, place, and time.  She is at her neurological basseline.   Skin: Skin is warm and dry.  Psychiatric: She has a normal mood and affect. Her behavior is normal. Judgment and thought content normal.  Nursing note and vitals reviewed.  Assessment/Plan: 1. Urinary tract infection with hematuria, site unspecified Send urine for culture and sensitivity and adjust antibiotics as indicated.  - CULTURE, URINE COMPREHENSIVE  2. Dysuria - POCT Urinalysis Dipstick positive for trace protein and moderate blood  3. Insomnia due to anxiety and fear Refilled her prescriptions for alprazolam and zolpidem. Take as needed and no more than prescribed  - ALPRAZolam (XANAX) 0.25 MG tablet; Take 1 tablet (0.25 mg total) by mouth 3 (three) times daily as needed for anxiety.  Dispense: 90 tablet; Refill: 1 - zolpidem (AMBIEN) 10 MG tablet; Take 1 tablet (10 mg total) by mouth at bedtime as needed for sleep.  Dispense: 90 tablet; Refill: 1  4. Unspecified menopausal and perimenopausal disorder - conjugated estrogens (PREMARIN) vaginal cream; Place 1  Applicatorful vaginally 2 (two) times a week.  Dispense: 30 g; Refill: 11  5. Urinary tract infection without hematuria, site unspecified - levofloxacin (LEVAQUIN) 500 MG tablet; Take 1 tablet (500 mg total) by mouth daily.  Dispense: 10 tablet; Refill: 0    General Counseling: Florean verbalizes understanding of the findings of todays visit and agrees with plan of treatment. I have discussed any further diagnostic evaluation  that may be needed or ordered today. We also reviewed her medications today. she has been encouraged to call the office with any questions or concerns that should arise related to todays visit.  This patient was seen by Leretha Pol, FNP- C in Collaboration with Dr [name] as a part of collaborative care agreement    Orders Placed This Encounter  Procedures  . CULTURE, URINE COMPREHENSIVE  . POCT Urinalysis Dipstick    Meds ordered this encounter  Medications  . ALPRAZolam (XANAX) 0.25 MG tablet    Sig: Take 1 tablet (0.25 mg total) by mouth 3 (three) times daily as needed for anxiety.    Dispense:  90 tablet    Refill:  1    Order Specific Question:   Supervising Provider    Answer:   [name] [[id number]]  . zolpidem (AMBIEN) 10 MG tablet    Sig: Take 1 tablet (10 mg total) by mouth at bedtime as needed for sleep.    Dispense:  90 tablet    Refill:  1    90 day prescription    Order Specific Question:   Supervising Provider    Answer:   [name] [[id number]]  . conjugated estrogens (PREMARIN) vaginal cream    Sig: Place 1 Applicatorful vaginally 2 (two) times a week.    Dispense:  30 g    Refill:  11    Order Specific Question:   Supervising Provider    Answer:   [name] [[id number]]  . levofloxacin (LEVAQUIN) 500 MG tablet    Sig: Take 1 tablet (500 mg total) by mouth daily.    Dispense:  10 tablet    Refill:  0    Order Specific Question:   Supervising Provider    Answer:   [name] [[id number]]    Time spent: 41 Minutes       Dr  [name] Internal medicine

## 2017-09-16 LAB — CULTURE, URINE COMPREHENSIVE

## 2017-10-04 DIAGNOSIS — R300 Dysuria: Secondary | ICD-10-CM | POA: Insufficient documentation

## 2017-10-04 DIAGNOSIS — F409 Phobic anxiety disorder, unspecified: Secondary | ICD-10-CM | POA: Insufficient documentation

## 2017-10-04 DIAGNOSIS — F5105 Insomnia due to other mental disorder: Secondary | ICD-10-CM | POA: Insufficient documentation

## 2017-10-04 DIAGNOSIS — N959 Unspecified menopausal and perimenopausal disorder: Secondary | ICD-10-CM | POA: Insufficient documentation

## 2017-10-18 ENCOUNTER — Other Ambulatory Visit: Payer: Self-pay | Admitting: Internal Medicine

## 2017-10-18 MED ORDER — AMLODIPINE BESYLATE 5 MG PO TABS: ORAL_TABLET | ORAL | 3 refills | Status: DC

## 2017-10-19 ENCOUNTER — Other Ambulatory Visit: Payer: Self-pay | Admitting: Nurse Practitioner

## 2017-10-19 ENCOUNTER — Telehealth: Payer: Self-pay

## 2017-10-19 DIAGNOSIS — F5105 Insomnia due to other mental disorder: Principal | ICD-10-CM

## 2017-10-19 DIAGNOSIS — F409 Phobic anxiety disorder, unspecified: Secondary | ICD-10-CM

## 2017-10-19 MED ORDER — ZOLPIDEM TARTRATE 10 MG PO TABS: 10.0000 mg | ORAL_TABLET | Freq: Every evening | ORAL | 1 refills | Status: DC | PRN

## 2017-10-19 NOTE — Telephone Encounter (Signed)
Pt's daughter called requesting refill of ambien.  Heather sent in rx to pharmacy.  I spoke to pt and informed her that rx was sent to pharmacy.  dbs

## 2017-10-19 NOTE — Telephone Encounter (Signed)
Sent 30 day prescription of Ambien 10mg  q[facility] prn to [facility] [address] per patient request

## 2017-10-19 NOTE — Progress Notes (Signed)
Sent 30 day prescription of Ambien 10mg  qhs prn to [facility] [address] per patient request

## 2017-10-19 NOTE — Telephone Encounter (Signed)
Note sent to provider 

## 2017-11-06 DIAGNOSIS — H02055 Trichiasis without entropian left lower eyelid: Secondary | ICD-10-CM | POA: Diagnosis not present

## 2017-11-06 DIAGNOSIS — H402233 Chronic angle-closure glaucoma, bilateral, severe stage: Secondary | ICD-10-CM | POA: Diagnosis not present

## 2017-11-14 ENCOUNTER — Ambulatory Visit: Payer: Self-pay | Admitting: Nurse Practitioner

## 2017-11-24 DIAGNOSIS — B301 Conjunctivitis due to adenovirus: Secondary | ICD-10-CM | POA: Diagnosis not present

## 2017-12-08 DIAGNOSIS — B301 Conjunctivitis due to adenovirus: Secondary | ICD-10-CM | POA: Diagnosis not present

## 2017-12-14 DIAGNOSIS — Z950 Presence of cardiac pacemaker: Secondary | ICD-10-CM | POA: Diagnosis not present

## 2017-12-14 DIAGNOSIS — Z882 Allergy status to sulfonamides status: Secondary | ICD-10-CM | POA: Diagnosis not present

## 2017-12-14 DIAGNOSIS — H3530 Unspecified macular degeneration: Secondary | ICD-10-CM | POA: Diagnosis not present

## 2017-12-14 DIAGNOSIS — G459 Transient cerebral ischemic attack, unspecified: Secondary | ICD-10-CM | POA: Diagnosis not present

## 2017-12-14 DIAGNOSIS — I10 Essential (primary) hypertension: Secondary | ICD-10-CM | POA: Diagnosis not present

## 2017-12-15 DIAGNOSIS — H402233 Chronic angle-closure glaucoma, bilateral, severe stage: Secondary | ICD-10-CM | POA: Diagnosis not present

## 2017-12-22 ENCOUNTER — Ambulatory Visit (INDEPENDENT_AMBULATORY_CARE_PROVIDER_SITE_OTHER): Payer: Medicare Other | Admitting: Nurse Practitioner

## 2017-12-22 ENCOUNTER — Encounter: Payer: Self-pay | Admitting: Nurse Practitioner

## 2017-12-22 VITALS — BP 126/63 | HR 88 | Resp 16 | Ht 64.0 in | Wt 156.0 lb

## 2017-12-22 DIAGNOSIS — Z23 Encounter for immunization: Secondary | ICD-10-CM

## 2017-12-22 DIAGNOSIS — F411 Generalized anxiety disorder: Secondary | ICD-10-CM

## 2017-12-22 DIAGNOSIS — F5105 Insomnia due to other mental disorder: Secondary | ICD-10-CM | POA: Diagnosis not present

## 2017-12-22 DIAGNOSIS — H02055 Trichiasis without entropian left lower eyelid: Secondary | ICD-10-CM | POA: Diagnosis not present

## 2017-12-22 DIAGNOSIS — F409 Phobic anxiety disorder, unspecified: Secondary | ICD-10-CM

## 2017-12-22 DIAGNOSIS — R300 Dysuria: Secondary | ICD-10-CM | POA: Diagnosis not present

## 2017-12-22 DIAGNOSIS — N390 Urinary tract infection, site not specified: Secondary | ICD-10-CM | POA: Diagnosis not present

## 2017-12-22 DIAGNOSIS — I10 Essential (primary) hypertension: Secondary | ICD-10-CM

## 2017-12-22 DIAGNOSIS — N959 Unspecified menopausal and perimenopausal disorder: Secondary | ICD-10-CM | POA: Diagnosis not present

## 2017-12-22 DIAGNOSIS — Z0001 Encounter for general adult medical examination with abnormal findings: Secondary | ICD-10-CM

## 2017-12-22 DIAGNOSIS — R319 Hematuria, unspecified: Secondary | ICD-10-CM

## 2017-12-22 LAB — POCT URINALYSIS DIPSTICK
BILIRUBIN UA: NEGATIVE
GLUCOSE UA: NEGATIVE
KETONES UA: NEGATIVE
Nitrite, UA: POSITIVE
PH UA: 5 (ref 5.0–8.0)
Protein, UA: POSITIVE — AB
SPEC GRAV UA: 1.01 (ref 1.010–1.025)
UROBILINOGEN UA: 0.2 U/dL

## 2017-12-22 MED ORDER — NITROFURANTOIN MONOHYD MACRO 100 MG PO CAPS: ORAL_CAPSULE | ORAL | 3 refills | Status: DC

## 2017-12-22 MED ORDER — PHENAZOPYRIDINE HCL 200 MG PO TABS: 200.0000 mg | ORAL_TABLET | Freq: Three times a day (TID) | ORAL | 0 refills | Status: DC | PRN

## 2017-12-22 MED ORDER — ESTROGENS, CONJUGATED 0.625 MG/GM VA CREA: 1.0000 | TOPICAL_CREAM | VAGINAL | 1 refills | Status: DC

## 2017-12-22 MED ORDER — NITROFURANTOIN MONOHYD MACRO 100 MG PO CAPS: 100.0000 mg | ORAL_CAPSULE | Freq: Two times a day (BID) | ORAL | 0 refills | Status: DC

## 2017-12-22 NOTE — Progress Notes (Signed)
[facility] [place], [place] [address]  Internal MEDICINE  Office Visit Note  Patient Name: Michele Summers  [id number]  [id number]  Date of Service: 12/22/2017   Pt is here for routine health maintenance examination  Chief Complaint  Patient presents with  . Urinary Tract Infection  . Medicare Wellness     Urinary Tract Infection   This is a recurrent problem. The current episode started in the past 7 days. The problem occurs every urination. The problem has been gradually worsening. The quality of the pain is described as burning. The pain is at a severity of 3/10. The pain is moderate. There has been no fever. She is not sexually active. There is no history of pyelonephritis. Associated symptoms include frequency, hematuria and urgency. Pertinent negatives include no chills, nausea or vomiting. She has tried nothing for the symptoms. Her past medical history is significant for recurrent UTIs.     Current Medication: Outpatient Encounter Medications as of 12/22/2017  Medication Sig  . albuterol (PROVENTIL HFA;VENTOLIN HFA) 108 (90 BASE) MCG/ACT inhaler Inhale 2 puffs into the lungs every 6 (six) hours as needed. For shortness of breath  . albuterol-ipratropium (COMBIVENT) 18-103 MCG/ACT inhaler Inhale into the lungs every 4 (four) hours.  . ALPRAZolam (XANAX) 0.25 MG tablet Take 1 tablet (0.25 mg total) by mouth 3 (three) times daily as needed for anxiety.  Marland Kitchen amLODipine (NORVASC) 5 MG tablet TAKE ONE TABLET BY MOUTH AT BEDTIME FOR HTN  . atorvastatin (LIPITOR) 10 MG tablet Take 10 mg by mouth daily. AM  . brimonidine-timolol (COMBIGAN) 0.2-0.5 % ophthalmic solution Place 1 drop into both eyes every 12 (twelve) hours.  . conjugated estrogens (PREMARIN) vaginal cream Place 1 Applicatorful vaginally 2 (two) times a week.  . Difluprednate (DUREZOL) 0.05 % EMUL Apply to eye.  Marland Kitchen FLUoxetine (PROZAC) 20 MG capsule Take 20 mg by mouth daily. AM  . hydrALAZINE  (APRESOLINE) 25 MG tablet Take 25 mg by mouth daily.  Marland Kitchen KRILL OIL PO Take by mouth.  . levofloxacin (LEVAQUIN) 500 MG tablet Take 1 tablet (500 mg total) by mouth daily.  . montelukast (SINGULAIR) 10 MG tablet Take 10 mg by mouth daily. AM  . moxifloxacin (VIGAMOX) 0.5 % ophthalmic solution 1 drop 3 (three) times daily.  . Multiple Vitamins-Minerals (PRESERVISION AREDS PO) Take by mouth. AM  . mupirocin ointment (BACTROBAN) 2 % Place 1 application into the nose 2 (two) times daily.  . nepafenac (ILEVRO) 0.3 % ophthalmic suspension 1 drop daily.  . nitrofurantoin, macrocrystal-monohydrate, (MACROBID) 100 MG capsule Take 1 capsule po Bid for 10 days then take 1 capsule daily to prevent further UTI  . oxyCODONE-acetaminophen (PERCOCET) 5-325 MG tablet Take 2 tablets by mouth every 6 (six) hours as needed for moderate pain or severe pain. (Patient not taking: Reported on 06/01/2017)  . phenazopyridine (PYRIDIUM) 200 MG tablet Take 1 tablet (200 mg total) by mouth 3 (three) times daily as needed for pain.  . rivaroxaban 20 MG TABS Take 20 mg by mouth daily. (Patient taking differently: Take 20 mg by mouth daily. AM)  . senna (SENOKOT) 8.6 MG TABS Take 1 tablet (8.6 mg total) by mouth 2 (two) times daily.  . travoprost, benzalkonium, (TRAVATAN) 0.004 % ophthalmic solution 1 drop at bedtime.  . vitamin B-12 (CYANOCOBALAMIN) 500 MCG tablet Take 500 mcg by mouth daily.  Marland Kitchen zolpidem (AMBIEN) 10 MG tablet Take 1 tablet (10 mg total) by mouth at bedtime as needed for sleep.  . [  DISCONTINUED] conjugated estrogens (PREMARIN) vaginal cream Place 1 Applicatorful vaginally 2 (two) times a week.  . [DISCONTINUED] HYDROcodone-acetaminophen (NORCO/VICODIN) 5-325 MG tablet Take 1 tablet by mouth every 6 (six) hours as needed for moderate pain.  . [DISCONTINUED] nitrofurantoin, macrocrystal-monohydrate, (MACROBID) 100 MG capsule Take 1 capsule (100 mg total) by mouth 2 (two) times daily.  . [DISCONTINUED] phenazopyridine  (PYRIDIUM) 200 MG tablet Take 1 tablet (200 mg total) by mouth 3 (three) times daily as needed for pain.  . [DISCONTINUED] promethazine-codeine (PHENERGAN WITH CODEINE) 6.25-10 MG/5ML syrup Take 5 mLs by mouth every 8 (eight) hours as needed for cough.   No facility-administered encounter medications on file as of 12/22/2017.     Surgical History: Past Surgical History:  Procedure Laterality Date  . ABDOMINAL HYSTERECTOMY    . BLADDER SURGERY    . BRAIN SURGERY    . CARDIAC CATHETERIZATION    . CATARACT EXTRACTION W/PHACO Left 09/15/2014   Procedure: CATARACT EXTRACTION PHACO AND INTRAOCULAR LENS PLACEMENT (IOC);  Surgeon: Ronnell Freshwater, MD;  Location: [place];  Service: Ophthalmology;  Laterality: Left;  . CATARACT EXTRACTION W/PHACO Right 03/23/2015   Procedure: CATARACT EXTRACTION PHACO AND INTRAOCULAR LENS PLACEMENT (IOC);  Surgeon: Ronnell Freshwater, MD;  Location: [place];  Service: Ophthalmology;  Laterality: Right;  . CHOLECYSTECTOMY    . CRANIOTOMY    . INSERT / REPLACE / REMOVE PACEMAKER    . JOINT REPLACEMENT     TOTAL HIP    Medical History: Past Medical History:  Diagnosis Date  . Arthritis    Hands, feet, jaw  . Asthma   . CHF (congestive heart failure) ([place])   . Depression   . DVT (deep venous thrombosis) ([place])   . Dysrhythmia    A-FIB  . Fracture of hip ([place])    right  . Full dentures    UPPER AND LOWER  . Headache(784.0)   . Hypercholesterolemia   . Hypertension    CONTROLLED ON MEDS  . PE (pulmonary embolism)   . Presence of permanent cardiac pacemaker   . Stroke (cerebrum) ([facility])    DIFFICULT FOR HER TO COMPLETE HER SENTENCES,RESPONDS SLOWLY  . Stroke [facility]) 2006   CVA-CEREBRAL ANEURYSM  . UTI (urinary tract infection)    FREQUENT    Family History: History reviewed. No pertinent family history.    Review of Systems  Constitutional: Positive for fatigue. Negative for activity change, chills and unexpected  weight change.  HENT: Negative for congestion, postnasal drip, rhinorrhea, sneezing and sore throat.   Eyes: Negative.  Negative for redness.  Respiratory: Negative for cough, chest tightness and shortness of breath.   Cardiovascular: Negative for chest pain and palpitations.  Gastrointestinal: Negative for abdominal pain, constipation, diarrhea, nausea and vomiting.  Endocrine: Negative for cold intolerance, heat intolerance and polydipsia.  Genitourinary: Positive for dysuria, frequency, hematuria and urgency.  Musculoskeletal: Negative for arthralgias, back pain, joint swelling and neck pain.  Skin: Negative for rash.  Allergic/Immunologic: Positive for environmental allergies.  Neurological: Positive for dizziness and weakness. Negative for tremors, numbness and headaches.  Hematological: Negative for adenopathy. Does not bruise/bleed easily.  Psychiatric/Behavioral: Positive for dysphoric mood. Negative for behavioral problems (Depression), sleep disturbance and suicidal ideas. The patient is not nervous/anxious.        Increased depression. Reports feeling lonely sometimes.      Today's Vitals   12/22/17 1412  BP: 126/63  Pulse: 88  Resp: 16  SpO2: 93%  Weight: 156 lb (70.8  kg)  Height: 5\' 4"  (1.626 m)    Physical Exam  Constitutional: She is oriented to person, place, and time. She appears well-developed and well-nourished.  HENT:  Head: Normocephalic and atraumatic.  Nose: Nose normal.  Mouth/Throat: Oropharynx is clear and moist.  Eyes: Pupils are equal, round, and reactive to light. Conjunctivae and EOM are normal.  Neck: Normal range of motion. Neck supple. No JVD present. Carotid bruit is not present. No tracheal deviation present. No thyromegaly present.  Cardiovascular: Normal rate. An irregular rhythm present.  Murmur heard.  Systolic murmur is present with a grade of 2/6. Pulmonary/Chest: Effort normal and breath sounds normal. She has no wheezes.  Abdominal:  Soft. Bowel sounds are normal. There is tenderness.  Mild tenderness along the suprapubic area of the abdomen.   Genitourinary:  Genitourinary Comments: Urine samle positive for trace protein and moderate blood .  Musculoskeletal: Normal range of motion.  Lymphadenopathy:    She has no cervical adenopathy.  Neurological: She is alert and oriented to person, place, and time.  She is at her neurological [place].   Skin: Skin is warm and dry. Capillary refill takes 2 to 3 seconds.  Psychiatric: She has a normal mood and affect. Her behavior is normal. Judgment and thought content normal.  Nursing note and vitals reviewed.  Assessment/Plan: 1. Encounter for general adult medical examination with abnormal findings Annual wellness visit today.  - POCT Urinalysis Dipstick  2. Urinary tract infection with hematuria, site unspecified Restart macrobid 100mg  twice daily for 10 days then daily after that to prevent forther infection. - nitrofurantoin, macrocrystal-monohydrate, (MACROBID) 100 MG capsule; Take 1 capsule po Bid for 10 days then take 1 capsule daily to prevent further UTI  Dispense: 40 capsule; Refill: 3  3. Dysuria Pyridium 200mg  may be taken up to three times daily if needed for bladder pain/spasms.  - phenazopyridine (PYRIDIUM) 200 MG tablet; Take 1 tablet (200 mg total) by mouth 3 (three) times daily as needed for pain.  Dispense: 10 tablet; Refill: 0  4. Essential hypertension Stable. Continue bp medication as prescribed.   5. GAD (generalized anxiety disorder) Continue alprazolam as needed and as prescribed.   6. Insomnia due to anxiety and fear May continue zolpidem 10mg  at bedtime as needed and as prescribed.   7. Unspecified menopausal and perimenopausal disorder - conjugated estrogens (PREMARIN) vaginal cream; Place 1 Applicatorful vaginally 2 (two) times a week.  Dispense: 48 g; Refill: 1  8. Need for vaccination against Streptococcus pneumoniae using pneumococcal  conjugate vaccine 13 - Pneumococcal conjugate vaccine 13-valent IM  General Counseling: Adisson verbalizes understanding of the findings of todays visit and agrees with plan of treatment. I have discussed any further diagnostic evaluation that may be needed or ordered today. We also reviewed her medications today. she has been encouraged to call the office with any questions or concerns that should arise related to todays visit.    Counseling:  This patient was seen by Leretha Pol FNP Collaboration with Dr [name] as a part of collaborative care agreement  Orders Placed This Encounter  Procedures  . Pneumococcal conjugate vaccine 13-valent IM  . POCT Urinalysis Dipstick    Meds ordered this encounter  Medications  . DISCONTD: nitrofurantoin, macrocrystal-monohydrate, (MACROBID) 100 MG capsule    Sig: Take 1 capsule (100 mg total) by mouth 2 (two) times daily.    Dispense:  20 capsule    Refill:  0    Order Specific Question:  Supervising Provider    Answer:   [name] [[id number]]  . conjugated estrogens (PREMARIN) vaginal cream    Sig: Place 1 Applicatorful vaginally 2 (two) times a week.    Dispense:  48 g    Refill:  1    Order Specific Question:   Supervising Provider    Answer:   [name] [[id number]]  . nitrofurantoin, macrocrystal-monohydrate, (MACROBID) 100 MG capsule    Sig: Take 1 capsule po Bid for 10 days then take 1 capsule daily to prevent further UTI    Dispense:  40 capsule    Refill:  3    Please disregard initial rx for #20 caps.    Order Specific Question:   Supervising Provider    Answer:   [name] [[id number]]  . phenazopyridine (PYRIDIUM) 200 MG tablet    Sig: Take 1 tablet (200 mg total) by mouth 3 (three) times daily as needed for pain.    Dispense:  10 tablet    Refill:  0    Order Specific Question:   Supervising Provider    Answer:   [name] [[id number]]    Time spent: [place], MD  Internal Medicine

## 2017-12-26 LAB — CULTURE, URINE COMPREHENSIVE

## 2017-12-31 MED ORDER — CIPROFLOXACIN HCL 500 MG PO TABS: 500.0000 mg | ORAL_TABLET | Freq: Two times a day (BID) | ORAL | 0 refills | Status: DC

## 2017-12-31 NOTE — Progress Notes (Signed)
Please let the patient know that, based on her urine culture results, I would like to change her antibiotics from macrobid to ciprofloxacin. This is twice daily for 10 days. I sent [facility] prescription to [facility] in [place]. Thanks.

## 2018-01-01 ENCOUNTER — Telehealth: Payer: Self-pay

## 2018-01-01 NOTE — Telephone Encounter (Signed)
Pt advised for urine culture result and advised pt to stopped macrobid and start cipro

## 2018-01-08 ENCOUNTER — Encounter: Payer: Self-pay | Admitting: Nurse Practitioner

## 2018-01-08 ENCOUNTER — Ambulatory Visit (INDEPENDENT_AMBULATORY_CARE_PROVIDER_SITE_OTHER): Payer: Medicare Other | Admitting: Nurse Practitioner

## 2018-01-08 VITALS — BP 140/62 | HR 88 | Resp 16 | Ht 66.0 in | Wt 160.2 lb

## 2018-01-08 DIAGNOSIS — F5105 Insomnia due to other mental disorder: Secondary | ICD-10-CM | POA: Diagnosis not present

## 2018-01-08 DIAGNOSIS — F409 Phobic anxiety disorder, unspecified: Secondary | ICD-10-CM

## 2018-01-08 DIAGNOSIS — R319 Hematuria, unspecified: Secondary | ICD-10-CM | POA: Diagnosis not present

## 2018-01-08 DIAGNOSIS — R300 Dysuria: Secondary | ICD-10-CM

## 2018-01-08 DIAGNOSIS — N390 Urinary tract infection, site not specified: Secondary | ICD-10-CM | POA: Diagnosis not present

## 2018-01-08 LAB — POCT URINALYSIS DIPSTICK
Bilirubin, UA: NEGATIVE
GLUCOSE UA: NEGATIVE
Ketones, UA: NEGATIVE
Nitrite, UA: NEGATIVE
Protein, UA: NEGATIVE
RBC UA: NEGATIVE
SPEC GRAV UA: 1.01 (ref 1.010–1.025)
Urobilinogen, UA: 0.2 E.U./dL
pH, UA: 5 (ref 5.0–8.0)

## 2018-01-08 MED ORDER — ALPRAZOLAM 0.25 MG PO TABS: 0.2500 mg | ORAL_TABLET | Freq: Three times a day (TID) | ORAL | 1 refills | Status: DC | PRN

## 2018-01-08 MED ORDER — CIPROFLOXACIN HCL 500 MG PO TABS: 500.0000 mg | ORAL_TABLET | ORAL | 3 refills | Status: DC

## 2018-01-08 MED ORDER — ZOLPIDEM TARTRATE 10 MG PO TABS: 10.0000 mg | ORAL_TABLET | Freq: Every evening | ORAL | 2 refills | Status: DC | PRN

## 2018-01-08 NOTE — Progress Notes (Signed)
[facility] [place], [place] [address]  Internal MEDICINE  Office Visit Note  Patient Name: Michele Summers  [id number]  [id number]  Date of Service: 01/17/2018  Chief Complaint  Patient presents with  . Urinary Tract Infection    2wk follow up    Urinary Tract Infection   This is a recurrent problem. The current episode started 1 to 4 weeks ago. The problem occurs intermittently. The problem has been gradually improving. The patient is experiencing no pain. There has been no fever. She is not sexually active. There is no history of pyelonephritis. Associated symptoms include frequency and urgency. Pertinent negatives include no chills, hematuria, nausea or vomiting. She has tried antibiotics for the symptoms. The treatment provided moderate relief. Her past medical history is significant for recurrent UTIs.       Current Medication: Outpatient Encounter Medications as of 01/08/2018  Medication Sig  . albuterol (PROVENTIL HFA;VENTOLIN HFA) 108 (90 BASE) MCG/ACT inhaler Inhale 2 puffs into the lungs every 6 (six) hours as needed. For shortness of breath  . albuterol-ipratropium (COMBIVENT) 18-103 MCG/ACT inhaler Inhale into the lungs every 4 (four) hours.  . ALPRAZolam (XANAX) 0.25 MG tablet Take 1 tablet (0.25 mg total) by mouth 3 (three) times daily as needed for anxiety.  Marland Kitchen amLODipine (NORVASC) 5 MG tablet TAKE ONE TABLET BY MOUTH AT BEDTIME FOR HTN  . atorvastatin (LIPITOR) 10 MG tablet Take 10 mg by mouth daily. AM  . brimonidine-timolol (COMBIGAN) 0.2-0.5 % ophthalmic solution Place 1 drop into both eyes every 12 (twelve) hours.  . ciprofloxacin (CIPRO) 500 MG tablet Take 1 tablet (500 mg total) by mouth 2 (two) times daily.  Marland Kitchen conjugated estrogens (PREMARIN) vaginal cream Place 1 Applicatorful vaginally 2 (two) times a week.  . Difluprednate (DUREZOL) 0.05 % EMUL Apply to eye.  Marland Kitchen FLUoxetine (PROZAC) 20 MG capsule Take 20 mg by mouth daily. AM  .  hydrALAZINE (APRESOLINE) 25 MG tablet Take 25 mg by mouth daily.  Marland Kitchen KRILL OIL PO Take by mouth.  . montelukast (SINGULAIR) 10 MG tablet Take 10 mg by mouth daily. AM  . moxifloxacin (VIGAMOX) 0.5 % ophthalmic solution 1 drop 3 (three) times daily.  . Multiple Vitamins-Minerals (PRESERVISION AREDS PO) Take by mouth. AM  . mupirocin ointment (BACTROBAN) 2 % Place 1 application into the nose 2 (two) times daily.  . nepafenac (ILEVRO) 0.3 % ophthalmic suspension 1 drop daily.  . phenazopyridine (PYRIDIUM) 200 MG tablet Take 1 tablet (200 mg total) by mouth 3 (three) times daily as needed for pain.  . rivaroxaban 20 MG TABS Take 20 mg by mouth daily. (Patient taking differently: Take 20 mg by mouth daily. AM)  . senna (SENOKOT) 8.6 MG TABS Take 1 tablet (8.6 mg total) by mouth 2 (two) times daily.  . travoprost, benzalkonium, (TRAVATAN) 0.004 % ophthalmic solution 1 drop at bedtime.  . vitamin B-12 (CYANOCOBALAMIN) 500 MCG tablet Take 500 mcg by mouth daily.  Marland Kitchen zolpidem (AMBIEN) 10 MG tablet Take 1 tablet (10 mg total) by mouth at bedtime as needed for sleep.  . [DISCONTINUED] ALPRAZolam (XANAX) 0.25 MG tablet Take 1 tablet (0.25 mg total) by mouth 3 (three) times daily as needed for anxiety.  . [DISCONTINUED] levofloxacin (LEVAQUIN) 500 MG tablet Take 1 tablet (500 mg total) by mouth daily.  . [DISCONTINUED] nitrofurantoin, macrocrystal-monohydrate, (MACROBID) 100 MG capsule Take 1 capsule po Bid for 10 days then take 1 capsule daily to prevent further UTI  . [DISCONTINUED]  zolpidem (AMBIEN) 10 MG tablet Take 1 tablet (10 mg total) by mouth at bedtime as needed for sleep.  . ciprofloxacin (CIPRO) 500 MG tablet Take 1 tablet (500 mg total) by mouth every other day.  . oxyCODONE-acetaminophen (PERCOCET) 5-325 MG tablet Take 2 tablets by mouth every 6 (six) hours as needed for moderate pain or severe pain. (Patient not taking: Reported on 06/01/2017)   No facility-administered encounter medications on  file as of 01/08/2018.     Surgical History: Past Surgical History:  Procedure Laterality Date  . ABDOMINAL HYSTERECTOMY    . BLADDER SURGERY    . BRAIN SURGERY    . CARDIAC CATHETERIZATION    . CATARACT EXTRACTION W/PHACO Left 09/15/2014   Procedure: CATARACT EXTRACTION PHACO AND INTRAOCULAR LENS PLACEMENT (IOC);  Surgeon: Ronnell Freshwater, MD;  Location: [facility];  Service: Ophthalmology;  Laterality: Left;  . CATARACT EXTRACTION W/PHACO Right 03/23/2015   Procedure: CATARACT EXTRACTION PHACO AND INTRAOCULAR LENS PLACEMENT (IOC);  Surgeon: Ronnell Freshwater, MD;  Location: [place] [facility];  Service: Ophthalmology;  Laterality: Right;  . CHOLECYSTECTOMY    . CRANIOTOMY    . INSERT / REPLACE / REMOVE PACEMAKER    . JOINT REPLACEMENT     TOTAL HIP    Medical History: Past Medical History:  Diagnosis Date  . Arthritis    Hands, feet, jaw  . Asthma   . CHF (congestive heart failure) ([place])   . Depression   . DVT (deep venous thrombosis) ([place])   . Dysrhythmia    A-FIB  . Fracture of hip ([place])    right  . Full dentures    UPPER AND LOWER  . Headache(784.0)   . Hypercholesterolemia   . Hypertension    CONTROLLED ON MEDS  . PE (pulmonary embolism)   . Presence of permanent cardiac pacemaker   . Stroke (cerebrum) ([facility])    DIFFICULT FOR HER TO COMPLETE HER SENTENCES,RESPONDS SLOWLY  . Stroke [facility]) 2006   CVA-CEREBRAL ANEURYSM  . UTI (urinary tract infection)    FREQUENT    Family History: Family History  Problem Relation Age of Onset  . Asthma Mother   . Hypertension Mother   . Heart disease Father     Social History   Socioeconomic History  . Marital status: Married    Spouse name: Not on file  . Number of children: Not on file  . Years of education: Not on file  . Highest education level: Not on file  Occupational History  . Not on file  Social Needs  . Financial resource strain: Not on file  . Food insecurity:    Worry: Not on  file    Inability: Not on file  . Transportation needs:    Medical: Not on file    Non-medical: Not on file  Tobacco Use  . Smoking status: Never Smoker  . Smokeless tobacco: Never Used  Substance and Sexual Activity  . Alcohol use: No  . Drug use: Yes    Types: Methaqualone  . Sexual activity: Never  Lifestyle  . Physical activity:    Days per week: Not on file    Minutes per session: Not on file  . Stress: Not on file  Relationships  . Social connections:    Talks on phone: Not on file    Gets together: Not on file    Attends religious service: Not on file    Active member of club or organization: Not on file  Attends meetings of clubs or organizations: Not on file    Relationship status: Not on file  . Intimate partner violence:    Fear of current or ex partner: Not on file    Emotionally abused: Not on file    Physically abused: Not on file    Forced sexual activity: Not on file  Other Topics Concern  . Not on file  Social History Narrative  . Not on file      Review of Systems  Constitutional: Positive for fatigue. Negative for activity change, chills and unexpected weight change.       Improved energy levels.   HENT: Negative for congestion, postnasal drip, rhinorrhea, sneezing and sore throat.   Eyes: Negative.  Negative for redness.  Respiratory: Negative for cough, chest tightness and shortness of breath.   Cardiovascular: Negative for chest pain and palpitations.  Gastrointestinal: Negative for abdominal pain, constipation, diarrhea, nausea and vomiting.  Endocrine: Negative for cold intolerance, heat intolerance and polydipsia.  Genitourinary: Positive for dysuria, frequency and urgency. Negative for hematuria.  Musculoskeletal: Negative for arthralgias, back pain, joint swelling and neck pain.  Skin: Negative for rash.  Allergic/Immunologic: Positive for environmental allergies.  Neurological: Positive for dizziness and weakness. Negative for tremors,  numbness and headaches.  Hematological: Negative for adenopathy. Does not bruise/bleed easily.  Psychiatric/Behavioral: Positive for dysphoric mood. Negative for behavioral problems (Depression), sleep disturbance and suicidal ideas. The patient is not nervous/anxious.        Increased depression. Reports feeling lonely sometimes.     Vital Signs: BP 140/62 (BP Location: Left Arm, Patient Position: Sitting, Cuff Size: Normal)   Pulse 88   Resp 16   Ht 5\' 6"  (1.676 m)   Wt 160 lb 3.2 oz (72.7 kg)   SpO2 95%   BMI 25.86 kg/m    Physical Exam  Constitutional: She is oriented to person, place, and time. She appears well-developed and well-nourished.  HENT:  Head: Normocephalic and atraumatic.  Nose: Nose normal.  Mouth/Throat: Oropharynx is clear and moist.  Eyes: Pupils are equal, round, and reactive to light. Conjunctivae and EOM are normal.  Neck: Normal range of motion. Neck supple. No JVD present. Carotid bruit is not present. No tracheal deviation present. No thyromegaly present.  Cardiovascular: Normal rate. An irregular rhythm present.  Murmur heard.  Systolic murmur is present with a grade of 2/6. Pulmonary/Chest: Effort normal and breath sounds normal. She has no wheezes.  Abdominal: Soft. Bowel sounds are normal. There is no tenderness.  Mild tenderness along the suprapubic area of the abdomen.   Genitourinary:  Genitourinary Comments: Urine sample positive for small wbc.  Musculoskeletal: Normal range of motion.  Lymphadenopathy:    She has no cervical adenopathy.  Neurological: She is alert and oriented to person, place, and time.  She is at her neurological basseline.   Skin: Skin is warm and dry. Capillary refill takes 2 to 3 seconds.  Psychiatric: She has a normal mood and affect. Her behavior is normal. Judgment and thought content normal.  Nursing note and vitals reviewed.  Assessment/Plan: 1. Urinary tract infection with hematuria, site unspecified impproved  buut not resolved. Will have her begin cipro 500mg  every other day to treat this infection and prefent further infections. Recheck u/a at next visit.  - ciprofloxacin (CIPRO) 500 MG tablet; Take 1 tablet (500 mg total) by mouth every other day.  Dispense: 30 tablet; Refill: 3  2. Dysuria - POCT Urinalysis Dipstick positive for small WBC only.  3. Insomnia due to anxiety and fear Will sent new, 30 day prescriptions, with refills, of her xanax and ambien. She has been awaiting 90 day prescriptinos from [place] for some time, however, prescriptions are not getting filled. Will keep these prescriptions local for now.  - ALPRAZolam (XANAX) 0.25 MG tablet; Take 1 tablet (0.25 mg total) by mouth 3 (three) times daily as needed for anxiety.  Dispense: 90 tablet; Refill: 1 - zolpidem (AMBIEN) 10 MG tablet; Take 1 tablet (10 mg total) by mouth at bedtime as needed for sleep.  Dispense: 30 tablet; Refill: 2  General Counseling: Michele Summers verbalizes understanding of the findings of todays visit and agrees with plan of treatment. I have discussed any further diagnostic evaluation that may be needed or ordered today. We also reviewed her medications today. she has been encouraged to call the office with any questions or concerns that should arise related to todays visit.  This patient was seen by Leretha Pol FNP Collaboration with Dr [name] as a part of collaborative care agreement  Orders Placed This Encounter  Procedures  . POCT Urinalysis Dipstick    Meds ordered this encounter  Medications  . ciprofloxacin (CIPRO) 500 MG tablet    Sig: Take 1 tablet (500 mg total) by mouth every other day.    Dispense:  30 tablet    Refill:  3    To start after she completes bid dosing of cipro    Order Specific Question:   Supervising Provider    Answer:   [name] [place]  . ALPRAZolam (XANAX) 0.25 MG tablet    Sig: Take 1 tablet (0.25 mg total) by mouth 3 (three) times daily as needed for anxiety.     Dispense:  90 tablet    Refill:  1    Patient to use goodRX card I provided for her.    Order Specific Question:   Supervising Provider    Answer:   [name] [[id number]]  . zolpidem (AMBIEN) 10 MG tablet    Sig: Take 1 tablet (10 mg total) by mouth at bedtime as needed for sleep.    Dispense:  30 tablet    Refill:  2    Patient to use GoodRx card I provided her.    Order Specific Question:   Supervising Provider    Answer:   [name] [[id number]]    Time spent: 78 Minutes      Dr [name] Internal medicine

## 2018-03-22 DIAGNOSIS — Z950 Presence of cardiac pacemaker: Secondary | ICD-10-CM | POA: Diagnosis not present

## 2018-03-22 DIAGNOSIS — I495 Sick sinus syndrome: Secondary | ICD-10-CM | POA: Diagnosis not present

## 2018-03-22 DIAGNOSIS — I10 Essential (primary) hypertension: Secondary | ICD-10-CM | POA: Diagnosis not present

## 2018-03-22 DIAGNOSIS — Z882 Allergy status to sulfonamides status: Secondary | ICD-10-CM | POA: Diagnosis not present

## 2018-03-22 DIAGNOSIS — H3530 Unspecified macular degeneration: Secondary | ICD-10-CM | POA: Diagnosis not present

## 2018-03-30 DIAGNOSIS — H02055 Trichiasis without entropian left lower eyelid: Secondary | ICD-10-CM | POA: Diagnosis not present

## 2018-03-30 DIAGNOSIS — H402233 Chronic angle-closure glaucoma, bilateral, severe stage: Secondary | ICD-10-CM | POA: Diagnosis not present

## 2018-04-10 ENCOUNTER — Ambulatory Visit (INDEPENDENT_AMBULATORY_CARE_PROVIDER_SITE_OTHER): Payer: Medicare Other | Admitting: Nurse Practitioner

## 2018-04-10 ENCOUNTER — Encounter: Payer: Self-pay | Admitting: Nurse Practitioner

## 2018-04-10 VITALS — BP 138/57 | HR 90 | Resp 16 | Ht 65.0 in | Wt 154.4 lb

## 2018-04-10 DIAGNOSIS — R319 Hematuria, unspecified: Secondary | ICD-10-CM | POA: Diagnosis not present

## 2018-04-10 DIAGNOSIS — E782 Mixed hyperlipidemia: Secondary | ICD-10-CM

## 2018-04-10 DIAGNOSIS — F409 Phobic anxiety disorder, unspecified: Secondary | ICD-10-CM

## 2018-04-10 DIAGNOSIS — IMO0001 Reserved for inherently not codable concepts without codable children: Secondary | ICD-10-CM

## 2018-04-10 DIAGNOSIS — J4520 Mild intermittent asthma, uncomplicated: Secondary | ICD-10-CM

## 2018-04-10 DIAGNOSIS — N390 Urinary tract infection, site not specified: Secondary | ICD-10-CM | POA: Diagnosis not present

## 2018-04-10 DIAGNOSIS — F5105 Insomnia due to other mental disorder: Secondary | ICD-10-CM

## 2018-04-10 DIAGNOSIS — I10 Essential (primary) hypertension: Secondary | ICD-10-CM | POA: Diagnosis not present

## 2018-04-10 DIAGNOSIS — S63509A Unspecified sprain of unspecified wrist, initial encounter: Secondary | ICD-10-CM | POA: Insufficient documentation

## 2018-04-10 MED ORDER — HYDRALAZINE HCL 25 MG PO TABS: 25.0000 mg | ORAL_TABLET | Freq: Every day | ORAL | 3 refills | Status: DC

## 2018-04-10 MED ORDER — ATORVASTATIN CALCIUM 10 MG PO TABS: 10.0000 mg | ORAL_TABLET | Freq: Every day | ORAL | 3 refills | Status: DC

## 2018-04-10 MED ORDER — CIPROFLOXACIN HCL 500 MG PO TABS: 500.0000 mg | ORAL_TABLET | ORAL | 3 refills | Status: DC

## 2018-04-10 MED ORDER — FLUTICASONE-SALMETEROL 250-50 MCG/DOSE IN AEPB: 1.0000 | INHALATION_SPRAY | Freq: Two times a day (BID) | RESPIRATORY_TRACT | 3 refills | Status: DC

## 2018-04-10 MED ORDER — ALPRAZOLAM 0.25 MG PO TABS: 0.2500 mg | ORAL_TABLET | Freq: Three times a day (TID) | ORAL | 1 refills | Status: DC | PRN

## 2018-04-10 MED ORDER — AMLODIPINE BESYLATE 5 MG PO TABS: ORAL_TABLET | ORAL | 3 refills | Status: DC

## 2018-04-10 MED ORDER — ZOLPIDEM TARTRATE 10 MG PO TABS: 10.0000 mg | ORAL_TABLET | Freq: Every evening | ORAL | 1 refills | Status: DC | PRN

## 2018-04-10 MED ORDER — ALBUTEROL SULFATE HFA 108 (90 BASE) MCG/ACT IN AERS: 2.0000 | INHALATION_SPRAY | Freq: Four times a day (QID) | RESPIRATORY_TRACT | 3 refills | Status: DC | PRN

## 2018-04-10 NOTE — Progress Notes (Signed)
[facility] [place], [place] [address]  Internal MEDICINE  Office Visit Note  Patient Name: Michele Summers  [id number]  [id number]  Date of Service: 04/14/2018  Chief Complaint  Patient presents with  . Medical Management of Chronic Issues    5month follow up, medication refills  . Quality Metric Gaps    pt will get flu vaccine from pharmacy    The patient is here for routine follow up visit. She states that she is feeling well, recently. Continues to take cipro on Monday, Wednesday, and Friday, in order to prevent urinary tract infection. She has had no dysuria, frequency, or urgency of urine.  She states that she is having trouble getting 90 day prescriptions filled at [facility] system. If her medications are filled through the [place], they will ship to her hoe for free. The prescriptions have to be faxed to the [place].       Current Medication: Outpatient Encounter Medications as of 04/10/2018  Medication Sig  . albuterol (PROVENTIL HFA;VENTOLIN HFA) 108 (90 Base) MCG/ACT inhaler Inhale 2 puffs into the lungs every 6 (six) hours as needed. For shortness of breath  . albuterol-ipratropium (COMBIVENT) 18-103 MCG/ACT inhaler Inhale into the lungs every 4 (four) hours.  . ALPRAZolam (XANAX) 0.25 MG tablet Take 1 tablet (0.25 mg total) by mouth 3 (three) times daily as needed for anxiety.  Marland Kitchen amLODipine (NORVASC) 5 MG tablet TAKE ONE TABLET BY MOUTH AT BEDTIME FOR HTN  . atorvastatin (LIPITOR) 10 MG tablet Take 1 tablet (10 mg total) by mouth daily. AM  . brimonidine-timolol (COMBIGAN) 0.2-0.5 % ophthalmic solution Place 1 drop into both eyes every 12 (twelve) hours.  . ciprofloxacin (CIPRO) 500 MG tablet Take 1 tablet (500 mg total) by mouth every other day.  . Difluprednate (DUREZOL) 0.05 % EMUL Apply to eye.  Marland Kitchen FLUoxetine (PROZAC) 20 MG capsule Take 20 mg by mouth daily. AM  . hydrALAZINE (APRESOLINE) 25 MG tablet Take 1 tablet (25 mg total) by mouth daily.   Marland Kitchen KRILL OIL PO Take by mouth.  . montelukast (SINGULAIR) 10 MG tablet Take 10 mg by mouth daily. AM  . moxifloxacin (VIGAMOX) 0.5 % ophthalmic solution 1 drop 3 (three) times daily.  . Multiple Vitamins-Minerals (PRESERVISION AREDS PO) Take by mouth. AM  . mupirocin ointment (BACTROBAN) 2 % Place 1 application into the nose 2 (two) times daily.  . nepafenac (ILEVRO) 0.3 % ophthalmic suspension 1 drop daily.  Marland Kitchen senna (SENOKOT) 8.6 MG TABS Take 1 tablet (8.6 mg total) by mouth 2 (two) times daily.  . travoprost, benzalkonium, (TRAVATAN) 0.004 % ophthalmic solution 1 drop at bedtime.  . vitamin B-12 (CYANOCOBALAMIN) 500 MCG tablet Take 500 mcg by mouth daily.  Marland Kitchen zolpidem (AMBIEN) 10 MG tablet Take 1 tablet (10 mg total) by mouth at bedtime as needed for sleep.  . [DISCONTINUED] albuterol (PROVENTIL HFA;VENTOLIN HFA) 108 (90 BASE) MCG/ACT inhaler Inhale 2 puffs into the lungs every 6 (six) hours as needed. For shortness of breath  . [DISCONTINUED] ALPRAZolam (XANAX) 0.25 MG tablet Take 1 tablet (0.25 mg total) by mouth 3 (three) times daily as needed for anxiety.  . [DISCONTINUED] amLODipine (NORVASC) 5 MG tablet TAKE ONE TABLET BY MOUTH AT BEDTIME FOR HTN  . [DISCONTINUED] atorvastatin (LIPITOR) 10 MG tablet Take 10 mg by mouth daily. AM  . [DISCONTINUED] ciprofloxacin (CIPRO) 500 MG tablet Take 1 tablet (500 mg total) by mouth 2 (two) times daily.  . [DISCONTINUED] ciprofloxacin (CIPRO)  500 MG tablet Take 1 tablet (500 mg total) by mouth every other day.  . [DISCONTINUED] conjugated estrogens (PREMARIN) vaginal cream Place 1 Applicatorful vaginally 2 (two) times a week.  . [DISCONTINUED] hydrALAZINE (APRESOLINE) 25 MG tablet Take 25 mg by mouth daily.  . [DISCONTINUED] phenazopyridine (PYRIDIUM) 200 MG tablet Take 1 tablet (200 mg total) by mouth 3 (three) times daily as needed for pain.  . [DISCONTINUED] rivaroxaban 20 MG TABS Take 20 mg by mouth daily. (Patient taking differently: Take 20 mg by  mouth daily. AM)  . [DISCONTINUED] zolpidem (AMBIEN) 10 MG tablet Take 1 tablet (10 mg total) by mouth at bedtime as needed for sleep.  . [DISCONTINUED] zolpidem (AMBIEN) 10 MG tablet Take 1 tablet (10 mg total) by mouth at bedtime as needed for sleep.  Marland Kitchen Fluticasone-Salmeterol (ADVAIR DISKUS) 250-50 MCG/DOSE AEPB Inhale 1 puff into the lungs 2 (two) times daily.  . [DISCONTINUED] oxyCODONE-acetaminophen (PERCOCET) 5-325 MG tablet Take 2 tablets by mouth every 6 (six) hours as needed for moderate pain or severe pain. (Patient not taking: Reported on 04/10/2018)   No facility-administered encounter medications on file as of 04/10/2018.     Surgical History: Past Surgical History:  Procedure Laterality Date  . ABDOMINAL HYSTERECTOMY    . BLADDER SURGERY    . BRAIN SURGERY    . CARDIAC CATHETERIZATION    . CATARACT EXTRACTION W/PHACO Left 09/15/2014   Procedure: CATARACT EXTRACTION PHACO AND INTRAOCULAR LENS PLACEMENT (IOC);  Surgeon: Ronnell Freshwater, MD;  Location: [facility];  Service: Ophthalmology;  Laterality: Left;  . CATARACT EXTRACTION W/PHACO Right 03/23/2015   Procedure: CATARACT EXTRACTION PHACO AND INTRAOCULAR LENS PLACEMENT (IOC);  Surgeon: Ronnell Freshwater, MD;  Location: [facility];  Service: Ophthalmology;  Laterality: Right;  . CHOLECYSTECTOMY    . CRANIOTOMY    . INSERT / REPLACE / REMOVE PACEMAKER    . JOINT REPLACEMENT     TOTAL HIP    Medical History: Past Medical History:  Diagnosis Date  . Arthritis    Hands, feet, jaw  . Asthma   . CHF (congestive heart failure) ([place])   . Depression   . DVT (deep venous thrombosis) ([place])   . Dysrhythmia    A-FIB  . Fracture of hip ([place])    right  . Full dentures    UPPER AND LOWER  . Headache(784.0)   . Hypercholesterolemia   . Hypertension    CONTROLLED ON MEDS  . PE (pulmonary embolism)   . Presence of permanent cardiac pacemaker   . Stroke (cerebrum) ([facility])    DIFFICULT FOR HER TO  COMPLETE HER SENTENCES,RESPONDS SLOWLY  . Stroke [facility]) 2006   CVA-CEREBRAL ANEURYSM  . UTI (urinary tract infection)    FREQUENT    Family History: Family History  Problem Relation Age of Onset  . Asthma Mother   . Hypertension Mother   . Heart disease Father     Social History   Socioeconomic History  . Marital status: Married    Spouse name: Not on file  . Number of children: Not on file  . Years of education: Not on file  . Highest education level: Not on file  Occupational History  . Not on file  Social Needs  . Financial resource strain: Not on file  . Food insecurity:    Worry: Not on file    Inability: Not on file  . Transportation needs:    Medical: Not on file    Non-medical: Not  on file  Tobacco Use  . Smoking status: Never Smoker  . Smokeless tobacco: Never Used  Substance and Sexual Activity  . Alcohol use: No  . Drug use: Yes    Types: Methaqualone  . Sexual activity: Never  Lifestyle  . Physical activity:    Days per week: Not on file    Minutes per session: Not on file  . Stress: Not on file  Relationships  . Social connections:    Talks on phone: Not on file    Gets together: Not on file    Attends religious service: Not on file    Active member of club or organization: Not on file    Attends meetings of clubs or organizations: Not on file    Relationship status: Not on file  . Intimate partner violence:    Fear of current or ex partner: Not on file    Emotionally abused: Not on file    Physically abused: Not on file    Forced sexual activity: Not on file  Other Topics Concern  . Not on file  Social History Narrative  . Not on file      Review of Systems  Constitutional: Negative for activity change, chills, fatigue and unexpected weight change.       Improved energy levels.   HENT: Negative for congestion, postnasal drip, rhinorrhea, sneezing and sore throat.   Eyes: Negative.   Respiratory: Negative for cough, chest tightness,  shortness of breath and wheezing.   Cardiovascular: Negative for chest pain and palpitations.  Gastrointestinal: Negative for abdominal pain, constipation, diarrhea, nausea and vomiting.  Endocrine: Negative for cold intolerance, heat intolerance and polydipsia.  Genitourinary: Negative for dysuria, frequency, hematuria and urgency.  Musculoskeletal: Negative for arthralgias, back pain, joint swelling and neck pain.  Skin: Negative for rash.  Allergic/Immunologic: Positive for environmental allergies.  Neurological: Positive for dizziness and weakness. Negative for tremors, numbness and headaches.  Hematological: Negative for adenopathy. Does not bruise/bleed easily.  Psychiatric/Behavioral: Positive for dysphoric mood. Negative for behavioral problems (Depression), sleep disturbance and suicidal ideas. The patient is nervous/anxious.        Increased depression. Reports feeling lonely sometimes.     Vital Signs: BP (!) 138/57 (BP Location: Left Arm, Patient Position: Sitting, Cuff Size: Large)   Pulse 90   Resp 16   Ht 5\' 5"  (1.651 m)   Wt 154 lb 6.4 oz (70 kg)   SpO2 97%   BMI 25.69 kg/m    Physical Exam  Constitutional: She is oriented to person, place, and time. She appears well-developed and well-nourished.  HENT:  Head: Normocephalic and atraumatic.  Nose: Nose normal.  Eyes: Pupils are equal, round, and reactive to light. Conjunctivae and EOM are normal.  Neck: Normal range of motion. Neck supple. No JVD present. Carotid bruit is not present. No tracheal deviation present. No thyromegaly present.  Cardiovascular: Normal rate. An irregular rhythm present.  Murmur heard.  Systolic murmur is present with a grade of 2/6. Pulmonary/Chest: Effort normal and breath sounds normal. She has no wheezes.  Abdominal: Soft. Bowel sounds are normal. There is no tenderness.  Musculoskeletal: Normal range of motion.  Lymphadenopathy:    She has no cervical adenopathy.  Neurological: She  is alert and oriented to person, place, and time.  She is at her neurological [place].   Skin: Skin is warm and dry. Capillary refill takes 2 to 3 seconds.  Psychiatric: She has a normal mood and affect. Her behavior is  normal. Judgment and thought content normal.  Nursing note and vitals reviewed.  Assessment/Plan: 1. Essential hypertension Stable. Continue amlodipine and hydralazine as prescribed. Refills to be faxed to [facility] system to fill.  - amLODipine (NORVASC) 5 MG tablet; TAKE ONE TABLET BY MOUTH AT BEDTIME FOR HTN  Dispense: 90 tablet; Refill: 3 - hydrALAZINE (APRESOLINE) 25 MG tablet; Take 1 tablet (25 mg total) by mouth daily.  Dispense: 90 tablet; Refill: 3  2. Moderate intermittent asthma without complication Stable. Continue Advair twice daily and ventolin inhaler as needed and as prescribed. New prescriptions faxed to [facility] system - Fluticasone-Salmeterol (ADVAIR DISKUS) 250-50 MCG/DOSE AEPB; Inhale 1 puff into the lungs 2 (two) times daily.  Dispense: 3 each; Refill: 3 - albuterol (PROVENTIL HFA;VENTOLIN HFA) 108 (90 Base) MCG/ACT inhaler; Inhale 2 puffs into the lungs every 6 (six) hours as needed. For shortness of breath  Dispense: 3 Inhaler; Refill: 3  3. Mixed hyperlipidemia Continue atorvastatin as prescribed.  - atorvastatin (LIPITOR) 10 MG tablet; Take 1 tablet (10 mg total) by mouth daily. AM  Dispense: 90 tablet; Refill: 3  4. Insomnia due to anxiety and fear May continue prescriptions as prescribed for both alprazolam 0.25mg  and zolpidem 10mg . New 30 day prescriptions sent to her local pharmacy. Ninety day prescriptions were also faxed to [facility] system to have delivered to her home.  - ALPRAZolam (XANAX) 0.25 MG tablet; Take 1 tablet (0.25 mg total) by mouth 3 (three) times daily as needed for anxiety.  Dispense: 270 tablet; Refill: 1 - zolpidem (AMBIEN) 10 MG tablet; Take 1 tablet (10 mg total) by mouth at bedtime as needed for sleep.  Dispense: 30 tablet; Refill: 1  5.  Urinary tract infection with hematuria, site unspecified Doing well. Continue ciprofloxacin 500mg  very other day to prevent further infection.  - ciprofloxacin (CIPRO) 500 MG tablet; Take 1 tablet (500 mg total) by mouth every other day.  Dispense: 30 tablet; Refill: 3  General Counseling: Hiba verbalizes understanding of the findings of todays visit and agrees with plan of treatment. I have discussed any further diagnostic evaluation that may be needed or ordered today. We also reviewed her medications today. she has been encouraged to call the office with any questions or concerns that should arise related to todays visit.  Hypertension Counseling:   The following hypertensive lifestyle modification were recommended and discussed:  1. Limiting alcohol intake to less than 1 oz/day of ethanol:(24 oz of beer or 8 oz of wine or 2 oz of 100-proof whiskey). 2. Take baby ASA 81 mg daily. 3. Importance of regular aerobic exercise and losing weight. 4. Reduce dietary saturated fat and cholesterol intake for overall cardiovascular health. 5. Maintaining adequate dietary potassium, calcium, and magnesium intake. 6. Regular monitoring of the blood pressure. 7. Reduce sodium intake to less than 100 mmol/day (less than 2.3 gm of sodium or less than 6 gm of sodium choride)   This patient was seen by [place] with Dr [name] as a part of collaborative care agreement  Meds ordered this encounter  Medications  . Fluticasone-Salmeterol (ADVAIR DISKUS) 250-50 MCG/DOSE AEPB    Sig: Inhale 1 puff into the lungs 2 (two) times daily.    Dispense:  3 each    Refill:  3    Order Specific Question:   Supervising Provider    Answer:   [name] [[id number]]  . DISCONTD: zolpidem (AMBIEN) 10 MG tablet    Sig: Take 1 tablet (10 mg  total) by mouth at bedtime as needed for sleep.    Dispense:  90 tablet    Refill:  1    Patient to use GoodRx card I provided her.    Order Specific  Question:   Supervising Provider    Answer:   [name] [[id number]]  . albuterol (PROVENTIL HFA;VENTOLIN HFA) 108 (90 Base) MCG/ACT inhaler    Sig: Inhale 2 puffs into the lungs every 6 (six) hours as needed. For shortness of breath    Dispense:  3 Inhaler    Refill:  3    Order Specific Question:   Supervising Provider    Answer:   [name] [[id number]]  . ALPRAZolam (XANAX) 0.25 MG tablet    Sig: Take 1 tablet (0.25 mg total) by mouth 3 (three) times daily as needed for anxiety.    Dispense:  270 tablet    Refill:  1    Order Specific Question:   Supervising Provider    Answer:   [name] [[id number]]  . amLODipine (NORVASC) 5 MG tablet    Sig: TAKE ONE TABLET BY MOUTH AT BEDTIME FOR HTN    Dispense:  90 tablet    Refill:  3    Order Specific Question:   Supervising Provider    Answer:   [name] [[id number]]  . atorvastatin (LIPITOR) 10 MG tablet    Sig: Take 1 tablet (10 mg total) by mouth daily. AM    Dispense:  90 tablet    Refill:  3    Order Specific Question:   Supervising Provider    Answer:   [name] [[id number]]  . hydrALAZINE (APRESOLINE) 25 MG tablet    Sig: Take 1 tablet (25 mg total) by mouth daily.    Dispense:  90 tablet    Refill:  3    Order Specific Question:   Supervising Provider    Answer:   [name] [[id number]]  . zolpidem (AMBIEN) 10 MG tablet    Sig: Take 1 tablet (10 mg total) by mouth at bedtime as needed for sleep.    Dispense:  30 tablet    Refill:  1    Patient to use GoodRx card I provided her.    Order Specific Question:   Supervising Provider    Answer:   [name] [[id number]]  . ciprofloxacin (CIPRO) 500 MG tablet    Sig: Take 1 tablet (500 mg total) by mouth every other day.    Dispense:  30 tablet    Refill:  3    To start after she completes bid dosing of cipro    Order Specific Question:   Supervising Provider    Answer:   [name] [[id number]]    Time spent: 25 Minutes      Dr [name] Internal medicine

## 2018-04-14 DIAGNOSIS — E782 Mixed hyperlipidemia: Secondary | ICD-10-CM | POA: Insufficient documentation

## 2018-05-07 ENCOUNTER — Telehealth: Payer: Self-pay

## 2018-05-07 NOTE — Telephone Encounter (Signed)
PT DAUGHTER CALLED AND SAID PT WANTED ABX BECAUSE SHE HAS A COUGH AND FEELS THAT IT MAY BE AN ONSET OF BRONCHITIS. I ADVISED PT DAUGHTER THAT SHE WILL NEED TO BE SEEN AND PT DAUGHTER SAID SHE WILL DISCUSS WITH PT AND SEE WHAT PT WOULD LIKE TO DO.

## 2018-05-11 ENCOUNTER — Ambulatory Visit: Payer: Self-pay | Admitting: Adult Health

## 2018-06-28 DIAGNOSIS — H02055 Trichiasis without entropian left lower eyelid: Secondary | ICD-10-CM | POA: Diagnosis not present

## 2018-06-28 DIAGNOSIS — H402233 Chronic angle-closure glaucoma, bilateral, severe stage: Secondary | ICD-10-CM | POA: Diagnosis not present

## 2018-07-13 ENCOUNTER — Encounter: Payer: Self-pay | Admitting: Nurse Practitioner

## 2018-07-13 ENCOUNTER — Ambulatory Visit (INDEPENDENT_AMBULATORY_CARE_PROVIDER_SITE_OTHER): Payer: Medicare Other | Admitting: Nurse Practitioner

## 2018-07-13 VITALS — BP 131/67 | HR 82 | Resp 16 | Ht 65.0 in | Wt 147.2 lb

## 2018-07-13 DIAGNOSIS — I10 Essential (primary) hypertension: Secondary | ICD-10-CM | POA: Diagnosis not present

## 2018-07-13 DIAGNOSIS — J4520 Mild intermittent asthma, uncomplicated: Secondary | ICD-10-CM | POA: Diagnosis not present

## 2018-07-13 DIAGNOSIS — F409 Phobic anxiety disorder, unspecified: Secondary | ICD-10-CM | POA: Diagnosis not present

## 2018-07-13 DIAGNOSIS — IMO0001 Reserved for inherently not codable concepts without codable children: Secondary | ICD-10-CM

## 2018-07-13 DIAGNOSIS — F5105 Insomnia due to other mental disorder: Secondary | ICD-10-CM

## 2018-07-13 MED ORDER — LOSARTAN POTASSIUM 50 MG PO TABS: 50.0000 mg | ORAL_TABLET | Freq: Every day | ORAL | 3 refills | Status: DC

## 2018-07-13 MED ORDER — MONTELUKAST SODIUM 10 MG PO TABS: 10.0000 mg | ORAL_TABLET | Freq: Every day | ORAL | 1 refills | Status: DC

## 2018-07-13 MED ORDER — LOSARTAN POTASSIUM 50 MG PO TABS: 50.0000 mg | ORAL_TABLET | Freq: Every day | ORAL | 1 refills | Status: DC

## 2018-07-13 MED ORDER — ZOLPIDEM TARTRATE 10 MG PO TABS: 10.0000 mg | ORAL_TABLET | Freq: Every evening | ORAL | 1 refills | Status: DC | PRN

## 2018-07-13 NOTE — Progress Notes (Signed)
[facility] [place], [place] [address]  Internal MEDICINE  Office Visit Note  Patient Name: Michele Summers  [id number]  [id number]  Date of Service: 07/29/2018  Chief Complaint  Patient presents with  . Medical Management of Chronic Issues    3 month follow up  . Hypertension  . Hyperlipidemia  . Quality Metric Gaps    pt has not gotten the flu vaccine yet spoke about getting it done from the pharmacy    The patient is here for routine follow up visit. She states that she is feeling well, recently. Continues to take cipro on Monday, Wednesday, and Friday, in order to prevent urinary tract infection. She has had no dysuria, frequency, or urgency of urine.  Blood pressure is well controlled. She denies chest pain, shortness of breath, or chest pressure. She does need to have refills for her blood pressure medication and singulair.        Current Medication: Outpatient Encounter Medications as of 07/13/2018  Medication Sig  . albuterol (PROVENTIL HFA;VENTOLIN HFA) 108 (90 Base) MCG/ACT inhaler Inhale 2 puffs into the lungs every 6 (six) hours as needed. For shortness of breath  . albuterol-ipratropium (COMBIVENT) 18-103 MCG/ACT inhaler Inhale into the lungs every 4 (four) hours.  . ALPRAZolam (XANAX) 0.25 MG tablet Take 1 tablet (0.25 mg total) by mouth 3 (three) times daily as needed for anxiety.  Marland Kitchen amLODipine (NORVASC) 5 MG tablet TAKE ONE TABLET BY MOUTH AT BEDTIME FOR HTN  . atorvastatin (LIPITOR) 10 MG tablet Take 1 tablet (10 mg total) by mouth daily. AM  . brimonidine-timolol (COMBIGAN) 0.2-0.5 % ophthalmic solution Place 1 drop into both eyes every 12 (twelve) hours.  . ciprofloxacin (CIPRO) 500 MG tablet Take 1 tablet (500 mg total) by mouth every other day.  . Difluprednate (DUREZOL) 0.05 % EMUL Apply to eye.  Marland Kitchen FLUoxetine (PROZAC) 20 MG capsule Take 20 mg by mouth daily. AM  . Fluticasone-Salmeterol (ADVAIR DISKUS) 250-50 MCG/DOSE AEPB Inhale 1  puff into the lungs 2 (two) times daily.  . hydrALAZINE (APRESOLINE) 25 MG tablet Take 1 tablet (25 mg total) by mouth daily.  Marland Kitchen KRILL OIL PO Take by mouth.  . montelukast (SINGULAIR) 10 MG tablet Take 1 tablet (10 mg total) by mouth daily.  Marland Kitchen moxifloxacin (VIGAMOX) 0.5 % ophthalmic solution 1 drop 3 (three) times daily.  . Multiple Vitamins-Minerals (PRESERVISION AREDS PO) Take by mouth. AM  . mupirocin ointment (BACTROBAN) 2 % Place 1 application into the nose 2 (two) times daily.  . nepafenac (ILEVRO) 0.3 % ophthalmic suspension 1 drop daily.  Marland Kitchen senna (SENOKOT) 8.6 MG TABS Take 1 tablet (8.6 mg total) by mouth 2 (two) times daily.  . travoprost, benzalkonium, (TRAVATAN) 0.004 % ophthalmic solution 1 drop at bedtime.  . vitamin B-12 (CYANOCOBALAMIN) 500 MCG tablet Take 500 mcg by mouth daily.  Marland Kitchen zolpidem (AMBIEN) 10 MG tablet Take 1 tablet (10 mg total) by mouth at bedtime as needed for sleep.  . [DISCONTINUED] montelukast (SINGULAIR) 10 MG tablet Take 10 mg by mouth daily. AM  . [DISCONTINUED] montelukast (SINGULAIR) 10 MG tablet Take 1 tablet (10 mg total) by mouth daily.  . [DISCONTINUED] zolpidem (AMBIEN) 10 MG tablet Take 1 tablet (10 mg total) by mouth at bedtime as needed for sleep.  . [DISCONTINUED] zolpidem (AMBIEN) 10 MG tablet Take 1 tablet (10 mg total) by mouth at bedtime as needed for sleep.  Marland Kitchen losartan (COZAAR) 50 MG tablet Take 1 tablet (50  mg total) by mouth daily.  . [DISCONTINUED] losartan (COZAAR) 50 MG tablet Take 1 tablet (50 mg total) by mouth daily.   No facility-administered encounter medications on file as of 07/13/2018.     Surgical History: Past Surgical History:  Procedure Laterality Date  . ABDOMINAL HYSTERECTOMY    . BLADDER SURGERY    . BRAIN SURGERY    . CARDIAC CATHETERIZATION    . CATARACT EXTRACTION W/PHACO Left 09/15/2014   Procedure: CATARACT EXTRACTION PHACO AND INTRAOCULAR LENS PLACEMENT (IOC);  Surgeon: Ronnell Freshwater, MD;  Location:  [facility];  Service: Ophthalmology;  Laterality: Left;  . CATARACT EXTRACTION W/PHACO Right 03/23/2015   Procedure: CATARACT EXTRACTION PHACO AND INTRAOCULAR LENS PLACEMENT (IOC);  Surgeon: Ronnell Freshwater, MD;  Location: [facility];  Service: Ophthalmology;  Laterality: Right;  . CHOLECYSTECTOMY    . CRANIOTOMY    . INSERT / REPLACE / REMOVE PACEMAKER    . JOINT REPLACEMENT     TOTAL HIP    Medical History: Past Medical History:  Diagnosis Date  . Arthritis    Hands, feet, jaw  . Asthma   . CHF (congestive heart failure) ([facility])   . Depression   . DVT (deep venous thrombosis) ([place])   . Dysrhythmia    A-FIB  . Fracture of hip ([facility])    right  . Full dentures    UPPER AND LOWER  . Headache(784.0)   . Hypercholesterolemia   . Hypertension    CONTROLLED ON MEDS  . PE (pulmonary embolism)   . Presence of permanent cardiac pacemaker   . Stroke (cerebrum) ([facility])    DIFFICULT FOR HER TO COMPLETE HER SENTENCES,RESPONDS SLOWLY  . Stroke [facility]) 2006   CVA-CEREBRAL ANEURYSM  . UTI (urinary tract infection)    FREQUENT    Family History: Family History  Problem Relation Age of Onset  . Asthma Mother   . Hypertension Mother   . Heart disease Father     Social History   Socioeconomic History  . Marital status: Married    Spouse name: Not on file  . Number of children: Not on file  . Years of education: Not on file  . Highest education level: Not on file  Occupational History  . Not on file  Social Needs  . Financial resource strain: Not on file  . Food insecurity:    Worry: Not on file    Inability: Not on file  . Transportation needs:    Medical: Not on file    Non-medical: Not on file  Tobacco Use  . Smoking status: Never Smoker  . Smokeless tobacco: Never Used  Substance and Sexual Activity  . Alcohol use: No  . Drug use: Yes    Types: Methaqualone  . Sexual activity: Never  Lifestyle  . Physical activity:    Days per week: Not on  file    Minutes per session: Not on file  . Stress: Not on file  Relationships  . Social connections:    Talks on phone: Not on file    Gets together: Not on file    Attends religious service: Not on file    Active member of club or organization: Not on file    Attends meetings of clubs or organizations: Not on file    Relationship status: Not on file  . Intimate partner violence:    Fear of current or ex partner: Not on file    Emotionally abused: Not on file  Physically abused: Not on file    Forced sexual activity: Not on file  Other Topics Concern  . Not on file  Social History Narrative  . Not on file      Review of Systems  Constitutional: Negative for activity change, chills, fatigue and unexpected weight change.       Improved energy levels.   HENT: Negative for congestion, postnasal drip, rhinorrhea, sneezing and sore throat.   Respiratory: Negative for cough, chest tightness, shortness of breath and wheezing.   Cardiovascular: Negative for chest pain and palpitations.  Gastrointestinal: Negative for abdominal pain, constipation, diarrhea, nausea and vomiting.  Endocrine: Negative for cold intolerance, heat intolerance, polydipsia and polyuria.  Genitourinary: Negative for dysuria, frequency, hematuria and urgency.  Musculoskeletal: Positive for gait problem. Negative for arthralgias, back pain, joint swelling and neck pain.  Skin: Negative for rash.  Allergic/Immunologic: Positive for environmental allergies.  Neurological: Positive for dizziness and weakness. Negative for tremors, numbness and headaches.  Hematological: Negative for adenopathy. Does not bruise/bleed easily.  Psychiatric/Behavioral: Positive for dysphoric mood. Negative for behavioral problems (Depression), sleep disturbance and suicidal ideas. The patient is nervous/anxious.        Increased depression. Reports feeling lonely sometimes.     Today's Vitals   07/13/18 1434  BP: 131/67  Pulse:  82  Resp: 16  SpO2: 97%  Weight: 147 lb 3.2 oz (66.8 kg)  Height: 5\' 5"  (1.651 m)   Body mass index is 24.5 kg/m.  Physical Exam Vitals signs and nursing note reviewed.  Constitutional:      Appearance: Normal appearance. She is well-developed.  HENT:     Head: Normocephalic and atraumatic.     Nose: Nose normal.  Eyes:     Conjunctiva/sclera: Conjunctivae normal.     Pupils: Pupils are equal, round, and reactive to light.  Neck:     Musculoskeletal: Normal range of motion and neck supple.     Thyroid: No thyromegaly.     Vascular: No carotid bruit or JVD.     Trachea: No tracheal deviation.  Cardiovascular:     Rate and Rhythm: Normal rate. Rhythm irregular.     Heart sounds: Murmur present. Systolic murmur present with a grade of 2/6.  Pulmonary:     Effort: Pulmonary effort is normal.     Breath sounds: Normal breath sounds. No wheezing.  Abdominal:     General: Bowel sounds are normal.     Palpations: Abdomen is soft.     Tenderness: There is no abdominal tenderness.  Musculoskeletal: Normal range of motion.  Lymphadenopathy:     Cervical: No cervical adenopathy.  Skin:    General: Skin is warm and dry.     Capillary Refill: Capillary refill takes 2 to 3 seconds.  Neurological:     Mental Status: She is alert and oriented to person, place, and time.     Comments: She is at her neurological [place].   Psychiatric:        Behavior: Behavior normal.        Thought Content: Thought content normal.        Judgment: Judgment normal.   Assessment/Plan: 1. Moderate intermittent asthma without complication Continue advair and singulair daily. Use rescue inhaler and neb treatments as needed and as prescribed . - montelukast (SINGULAIR) 10 MG tablet; Take 1 tablet (10 mg total) by mouth daily.  Dispense: 30 tablet; Refill: 1  2. Essential hypertension Stable. Continue blood pressure medication as prescribed.  - losartan (COZAAR)  50 MG tablet; Take 1 tablet (50 mg  total) by mouth daily.  Dispense: 90 tablet; Refill: 3  3. Insomnia due to anxiety and fear May continue ambien as prescribed. New prescription printed out and will be sent to [facility].  - zolpidem (AMBIEN) 10 MG tablet; Take 1 tablet (10 mg total) by mouth at bedtime as needed for sleep.  Dispense: 90 tablet; Refill: 1  General Counseling: Graci verbalizes understanding of the findings of todays visit and agrees with plan of treatment. I have discussed any further diagnostic evaluation that may be needed or ordered today. We also reviewed her medications today. she has been encouraged to call the office with any questions or concerns that should arise related to todays visit.   Hypertension Counseling:   The following hypertensive lifestyle modification were recommended and discussed:  1. Limiting alcohol intake to less than 1 oz/day of ethanol:(24 oz of beer or 8 oz of wine or 2 oz of 100-proof whiskey). 2. Take baby ASA 81 mg daily. 3. Importance of regular aerobic exercise and losing weight. 4. Reduce dietary saturated fat and cholesterol intake for overall cardiovascular health. 5. Maintaining adequate dietary potassium, calcium, and magnesium intake. 6. Regular monitoring of the blood pressure. 7. Reduce sodium intake to less than 100 mmol/day (less than 2.3 gm of sodium or less than 6 gm of sodium choride)   This patient was seen by [place] with Dr [name] as a part of collaborative care agreement  Meds ordered this encounter  Medications  . DISCONTD: montelukast (SINGULAIR) 10 MG tablet    Sig: Take 1 tablet (10 mg total) by mouth daily.    Dispense:  30 tablet    Refill:  1    Order Specific Question:   Supervising Provider    Answer:   [name] [[id number]]  . DISCONTD: losartan (COZAAR) 50 MG tablet    Sig: Take 1 tablet (50 mg total) by mouth daily.    Dispense:  30 tablet    Refill:  1    Order Specific Question:   Supervising Provider     Answer:   [name] [[id number]]  . losartan (COZAAR) 50 MG tablet    Sig: Take 1 tablet (50 mg total) by mouth daily.    Dispense:  90 tablet    Refill:  3    Order Specific Question:   Supervising Provider    Answer:   [name] [[id number]]  . montelukast (SINGULAIR) 10 MG tablet    Sig: Take 1 tablet (10 mg total) by mouth daily.    Dispense:  30 tablet    Refill:  1    Order Specific Question:   Supervising Provider    Answer:   [name] [[id number]]  . DISCONTD: zolpidem (AMBIEN) 10 MG tablet    Sig: Take 1 tablet (10 mg total) by mouth at bedtime as needed for sleep.    Dispense:  30 tablet    Refill:  1    Patient to use GoodRx card I provided her.    Order Specific Question:   Supervising Provider    Answer:   [name] [[id number]]  . zolpidem (AMBIEN) 10 MG tablet    Sig: Take 1 tablet (10 mg total) by mouth at bedtime as needed for sleep.    Dispense:  90 tablet    Refill:  1    Patient to use GoodRx card I provided her.  Order Specific Question:   Supervising Provider    Answer:   [name] [[id number]]    Time spent: 8 Minutes      Dr [name] Internal medicine

## 2018-07-17 ENCOUNTER — Telehealth: Payer: Self-pay

## 2018-07-17 NOTE — Telephone Encounter (Signed)
Faxed pres for ambien to [facility]

## 2018-08-06 ENCOUNTER — Other Ambulatory Visit: Payer: Self-pay

## 2018-08-06 DIAGNOSIS — J4520 Mild intermittent asthma, uncomplicated: Secondary | ICD-10-CM

## 2018-08-06 DIAGNOSIS — IMO0001 Reserved for inherently not codable concepts without codable children: Secondary | ICD-10-CM

## 2018-08-06 MED ORDER — MONTELUKAST SODIUM 10 MG PO TABS: 10.0000 mg | ORAL_TABLET | Freq: Every day | ORAL | 1 refills | Status: DC

## 2018-08-20 ENCOUNTER — Other Ambulatory Visit: Payer: Self-pay | Admitting: Nurse Practitioner

## 2018-08-20 ENCOUNTER — Other Ambulatory Visit: Payer: Self-pay | Admitting: Internal Medicine

## 2018-08-20 DIAGNOSIS — J4520 Mild intermittent asthma, uncomplicated: Secondary | ICD-10-CM

## 2018-08-20 DIAGNOSIS — IMO0001 Reserved for inherently not codable concepts without codable children: Secondary | ICD-10-CM

## 2018-08-20 MED ORDER — MONTELUKAST SODIUM 10 MG PO TABS: 10.0000 mg | ORAL_TABLET | Freq: Every day | ORAL | 1 refills | Status: DC

## 2018-10-11 ENCOUNTER — Encounter: Payer: Self-pay | Admitting: Nurse Practitioner

## 2018-10-11 ENCOUNTER — Other Ambulatory Visit: Payer: Self-pay

## 2018-10-11 ENCOUNTER — Ambulatory Visit (INDEPENDENT_AMBULATORY_CARE_PROVIDER_SITE_OTHER): Payer: Medicare Other | Admitting: Nurse Practitioner

## 2018-10-11 VITALS — Ht 65.0 in | Wt 147.0 lb

## 2018-10-11 DIAGNOSIS — F411 Generalized anxiety disorder: Secondary | ICD-10-CM | POA: Diagnosis not present

## 2018-10-11 DIAGNOSIS — F409 Phobic anxiety disorder, unspecified: Secondary | ICD-10-CM | POA: Diagnosis not present

## 2018-10-11 DIAGNOSIS — IMO0001 Reserved for inherently not codable concepts without codable children: Secondary | ICD-10-CM

## 2018-10-11 DIAGNOSIS — I10 Essential (primary) hypertension: Secondary | ICD-10-CM

## 2018-10-11 DIAGNOSIS — J4520 Mild intermittent asthma, uncomplicated: Secondary | ICD-10-CM

## 2018-10-11 DIAGNOSIS — F5105 Insomnia due to other mental disorder: Secondary | ICD-10-CM

## 2018-10-11 NOTE — Progress Notes (Signed)
[facility] [place], [place] [address]  Internal MEDICINE  Telephone Visit  Patient Name: Michele Summers  [id number]  [id number]  Date of Service: 10/11/2018  I connected with the patient at 2:42pm  by telephone and verified the patients identity using two identifiers.   I discussed the limitations, risks, security and privacy concerns of performing an evaluation and management service by telephone and the availability of in person appointments. I also discussed with the patient that there may be a patient responsible charge related to the service.  The patient expressed understanding and agrees to proceed.    Chief Complaint  Patient presents with  . Telephone Screen    PHONE VISIT [phone]  . Telephone Assessment  . Medical Management of Chronic Issues    3 month follow up  . Hypertension  . Hyperlipidemia  . Congestive Heart Failure  . Asthma  . Fall    pt fell a couple of days ago stated that she was fine knees are just swollen    The patient has been contacted via telephone for follow up visit due to concerns for spread of novel coronavirus. The patient states that she slipped off her bed two nights ago. She was unable to get up due to weakness she has which is residual from prior strokes in the past. She states she had to call EMS for help. States that she did not get hurt. EMS providers checked her vital signs which were all stable. They suggested she get a medication list to keep on hand so she can tell anyone what medications she is currently taking. Currently she feels well. Has no concerns or complaints. Unsure if she needs to have medication refills at this time. Her granddaughter will call pharmacy and let us know if anything needs new prescription sent to [facility].       Current Medication: Outpatient Encounter Medications as of 10/11/2018  Medication Sig  . albuterol (PROVENTIL HFA;VENTOLIN HFA) 108 (90 Base) MCG/ACT inhaler Inhale 2  puffs into the lungs every 6 (six) hours as needed. For shortness of breath  . albuterol-ipratropium (COMBIVENT) 18-103 MCG/ACT inhaler Inhale into the lungs every 4 (four) hours.  . ALPRAZolam (XANAX) 0.25 MG tablet Take 1 tablet (0.25 mg total) by mouth 3 (three) times daily as needed for anxiety.  Marland Kitchen amLODipine (NORVASC) 5 MG tablet TAKE ONE TABLET BY MOUTH AT BEDTIME FOR HTN  . atorvastatin (LIPITOR) 10 MG tablet Take 1 tablet (10 mg total) by mouth daily. AM  . brimonidine-timolol (COMBIGAN) 0.2-0.5 % ophthalmic solution Place 1 drop into both eyes every 12 (twelve) hours.  . ciprofloxacin (CIPRO) 500 MG tablet Take 1 tablet (500 mg total) by mouth every other day.  . Difluprednate (DUREZOL) 0.05 % EMUL Apply to eye.  Marland Kitchen Fluticasone-Salmeterol (ADVAIR DISKUS) 250-50 MCG/DOSE AEPB Inhale 1 puff into the lungs 2 (two) times daily.  . hydrALAZINE (APRESOLINE) 25 MG tablet Take 1 tablet (25 mg total) by mouth daily.  Marland Kitchen KRILL OIL PO Take by mouth.  . losartan (COZAAR) 50 MG tablet Take 1 tablet (50 mg total) by mouth daily.  . montelukast (SINGULAIR) 10 MG tablet Take 1 tablet (10 mg total) by mouth daily.  Marland Kitchen moxifloxacin (VIGAMOX) 0.5 % ophthalmic solution 1 drop 3 (three) times daily.  . Multiple Vitamins-Minerals (PRESERVISION AREDS PO) Take by mouth. AM  . nepafenac (ILEVRO) 0.3 % ophthalmic suspension 1 drop daily.  Marland Kitchen senna (SENOKOT) 8.6 MG TABS Take 1 tablet (8.6 mg  total) by mouth 2 (two) times daily.  . travoprost, benzalkonium, (TRAVATAN) 0.004 % ophthalmic solution 1 drop at bedtime.  . vitamin B-12 (CYANOCOBALAMIN) 500 MCG tablet Take 500 mcg by mouth daily.  Marland Kitchen zolpidem (AMBIEN) 10 MG tablet Take 1 tablet (10 mg total) by mouth at bedtime as needed for sleep.  . [DISCONTINUED] FLUoxetine (PROZAC) 20 MG capsule Take 20 mg by mouth daily. AM  . [DISCONTINUED] mupirocin ointment (BACTROBAN) 2 % Place 1 application into the nose 2 (two) times daily.   No facility-administered encounter  medications on file as of 10/11/2018.     Surgical History: Past Surgical History:  Procedure Laterality Date  . ABDOMINAL HYSTERECTOMY    . BLADDER SURGERY    . BRAIN SURGERY    . CARDIAC CATHETERIZATION    . CATARACT EXTRACTION W/PHACO Left 09/15/2014   Procedure: CATARACT EXTRACTION PHACO AND INTRAOCULAR LENS PLACEMENT (IOC);  Surgeon: Ronnell Freshwater, MD;  Location: [facility];  Service: Ophthalmology;  Laterality: Left;  . CATARACT EXTRACTION W/PHACO Right 03/23/2015   Procedure: CATARACT EXTRACTION PHACO AND INTRAOCULAR LENS PLACEMENT (IOC);  Surgeon: Ronnell Freshwater, MD;  Location: [facility];  Service: Ophthalmology;  Laterality: Right;  . CHOLECYSTECTOMY    . CRANIOTOMY    . INSERT / REPLACE / REMOVE PACEMAKER    . JOINT REPLACEMENT     TOTAL HIP    Medical History: Past Medical History:  Diagnosis Date  . Arthritis    Hands, feet, jaw  . Asthma   . CHF (congestive heart failure) ([place])   . Depression   . DVT (deep venous thrombosis) ([place])   . Dysrhythmia    A-FIB  . Fracture of hip ([place])    right  . Full dentures    UPPER AND LOWER  . Headache(784.0)   . Hypercholesterolemia   . Hypertension    CONTROLLED ON MEDS  . PE (pulmonary embolism)   . Presence of permanent cardiac pacemaker   . Stroke (cerebrum) ([facility])    DIFFICULT FOR HER TO COMPLETE HER SENTENCES,RESPONDS SLOWLY  . Stroke [facility]) 2006   CVA-CEREBRAL ANEURYSM  . UTI (urinary tract infection)    FREQUENT    Family History: Family History  Problem Relation Age of Onset  . Asthma Mother   . Hypertension Mother   . Heart disease Father     Social History   Socioeconomic History  . Marital status: Married    Spouse name: Not on file  . Number of children: Not on file  . Years of education: Not on file  . Highest education level: Not on file  Occupational History  . Not on file  Social Needs  . Financial resource strain: Not on file  . Food insecurity:     Worry: Not on file    Inability: Not on file  . Transportation needs:    Medical: Not on file    Non-medical: Not on file  Tobacco Use  . Smoking status: Never Smoker  . Smokeless tobacco: Never Used  Substance and Sexual Activity  . Alcohol use: No  . Drug use: Yes    Types: Methaqualone  . Sexual activity: Never  Lifestyle  . Physical activity:    Days per week: Not on file    Minutes per session: Not on file  . Stress: Not on file  Relationships  . Social connections:    Talks on phone: Not on file    Gets together: Not on file  Attends religious service: Not on file    Active member of club or organization: Not on file    Attends meetings of clubs or organizations: Not on file    Relationship status: Not on file  . Intimate partner violence:    Fear of current or ex partner: Not on file    Emotionally abused: Not on file    Physically abused: Not on file    Forced sexual activity: Not on file  Other Topics Concern  . Not on file  Social History Narrative  . Not on file      Review of Systems  Constitutional: Negative for activity change, chills, fatigue and unexpected weight change.       Improved energy levels.   HENT: Negative for congestion, postnasal drip, rhinorrhea, sneezing and sore throat.   Respiratory: Negative for cough, chest tightness, shortness of breath and wheezing.   Cardiovascular: Negative for chest pain and palpitations.       Patient has a pacemaker.   Gastrointestinal: Negative for abdominal pain, constipation, diarrhea, nausea and vomiting.  Endocrine: Negative for cold intolerance, heat intolerance, polydipsia and polyuria.  Musculoskeletal: Positive for gait problem. Negative for arthralgias, back pain, joint swelling and neck pain.  Skin: Negative for rash.  Allergic/Immunologic: Positive for environmental allergies.  Neurological: Positive for dizziness and weakness. Negative for tremors, numbness and headaches.  Hematological:  Negative for adenopathy. Does not bruise/bleed easily.  Psychiatric/Behavioral: Positive for dysphoric mood. Negative for behavioral problems (Depression), sleep disturbance and suicidal ideas. The patient is nervous/anxious.        Increased depression. Reports feeling lonely sometimes.     Today's Vitals   10/11/18 1423  Weight: 147 lb (66.7 kg)  Height: 5\' 5"  (1.651 m)   Body mass index is 24.46 kg/m.  Observation/Objective:   The patient is alert and oriented. She is pleasant and answers all questions appropriately. Breathing is non-labored. She is in no acute distress at this time.    Assessment/Plan: 1. Essential hypertension Blood pressure stable. Continue meds as prescribed. Will refill as needed   2. Moderate intermittent asthma without complication Stable. Continue all respiratory medication as prescribed   3. GAD (generalized anxiety disorder) May continue to take alprazolam 0.25mg  three times daily as needed for acute anxiety  4. Insomnia due to anxiety and fear May continue to take zolpidem at bedtime as needed for insomnia.   General Counseling: Michele Summers understanding of the findings of today's phone visit and agrees with plan of treatment. I have discussed any further diagnostic evaluation that may be needed or ordered today. We also reviewed her medications today. she has been encouraged to call the office with any questions or concerns that should arise related to todays visit.  Hypertension Counseling:   The following hypertensive lifestyle modification were recommended and discussed:  1. Limiting alcohol intake to less than 1 oz/day of ethanol:(24 oz of beer or 8 oz of wine or 2 oz of 100-proof whiskey). 2. Take baby ASA 81 mg daily. 3. Importance of regular aerobic exercise and losing weight. 4. Reduce dietary saturated fat and cholesterol intake for overall cardiovascular health. 5. Maintaining adequate dietary potassium, calcium, and magnesium  intake. 6. Regular monitoring of the blood pressure. 7. Reduce sodium intake to less than 100 mmol/day (less than 2.3 gm of sodium or less than 6 gm of sodium choride)   This patient was seen by [place] with Dr [name] as a part of collaborative  care agreement   Time spent: [place] Internal medicine

## 2018-11-01 ENCOUNTER — Telehealth: Payer: Self-pay | Admitting: Nurse Practitioner

## 2018-11-01 DIAGNOSIS — F409 Phobic anxiety disorder, unspecified: Secondary | ICD-10-CM

## 2018-11-01 NOTE — Telephone Encounter (Signed)
MEDICATION FOR AMBIEN WAS NOT RECEIVED AT PHARMACY AND PATIENT IS OUT OF AMBIEN , IT DID NOT SEND ELECTRONIC PLEASE SEND AGAIN TO THE MAIL ORDER PHARMACY

## 2018-11-02 ENCOUNTER — Other Ambulatory Visit: Payer: Self-pay | Admitting: Nurse Practitioner

## 2018-11-02 DIAGNOSIS — F409 Phobic anxiety disorder, unspecified: Secondary | ICD-10-CM

## 2018-11-02 MED ORDER — ZOLPIDEM TARTRATE 10 MG PO TABS: 10.0000 mg | ORAL_TABLET | Freq: Every evening | ORAL | 1 refills | Status: DC | PRN

## 2018-11-02 NOTE — Telephone Encounter (Signed)
Refilled zolpidem 10mg  and sent to [facility].

## 2018-11-02 NOTE — Progress Notes (Signed)
Refilled zolpidem 10mg  and sent to [facility].

## 2018-11-02 NOTE — Telephone Encounter (Signed)
Which local pharmacy does she want me to send this to?

## 2018-11-07 ENCOUNTER — Other Ambulatory Visit: Payer: Self-pay | Admitting: Nurse Practitioner

## 2018-11-07 DIAGNOSIS — F409 Phobic anxiety disorder, unspecified: Secondary | ICD-10-CM

## 2018-11-07 DIAGNOSIS — F5105 Insomnia due to other mental disorder: Secondary | ICD-10-CM

## 2018-11-07 MED ORDER — ALPRAZOLAM 0.25 MG PO TABS: 0.2500 mg | ORAL_TABLET | Freq: Three times a day (TID) | ORAL | 2 refills | Status: DC | PRN

## 2018-11-07 NOTE — Progress Notes (Signed)
Approved her alprazolam prescription and [facility] [place].

## 2018-11-29 ENCOUNTER — Other Ambulatory Visit: Payer: Self-pay

## 2018-11-29 DIAGNOSIS — I10 Essential (primary) hypertension: Secondary | ICD-10-CM

## 2018-11-29 MED ORDER — LOSARTAN POTASSIUM 50 MG PO TABS: 50.0000 mg | ORAL_TABLET | Freq: Every day | ORAL | 1 refills | Status: DC

## 2018-12-20 DIAGNOSIS — I4891 Unspecified atrial fibrillation: Secondary | ICD-10-CM | POA: Diagnosis not present

## 2018-12-20 DIAGNOSIS — I499 Cardiac arrhythmia, unspecified: Secondary | ICD-10-CM | POA: Diagnosis not present

## 2018-12-20 DIAGNOSIS — H3530 Unspecified macular degeneration: Secondary | ICD-10-CM | POA: Diagnosis not present

## 2018-12-20 DIAGNOSIS — Z950 Presence of cardiac pacemaker: Secondary | ICD-10-CM | POA: Diagnosis not present

## 2018-12-20 DIAGNOSIS — I495 Sick sinus syndrome: Secondary | ICD-10-CM | POA: Diagnosis not present

## 2018-12-31 DIAGNOSIS — I4891 Unspecified atrial fibrillation: Secondary | ICD-10-CM | POA: Diagnosis not present

## 2018-12-31 DIAGNOSIS — Z882 Allergy status to sulfonamides status: Secondary | ICD-10-CM | POA: Diagnosis not present

## 2018-12-31 DIAGNOSIS — I495 Sick sinus syndrome: Secondary | ICD-10-CM | POA: Diagnosis not present

## 2018-12-31 DIAGNOSIS — I10 Essential (primary) hypertension: Secondary | ICD-10-CM | POA: Diagnosis not present

## 2019-01-03 ENCOUNTER — Other Ambulatory Visit: Payer: Self-pay

## 2019-01-03 ENCOUNTER — Ambulatory Visit (INDEPENDENT_AMBULATORY_CARE_PROVIDER_SITE_OTHER): Payer: Medicare Other | Admitting: Nurse Practitioner

## 2019-01-03 ENCOUNTER — Encounter: Payer: Self-pay | Admitting: Nurse Practitioner

## 2019-01-03 VITALS — BP 148/76 | HR 99 | Resp 16 | Ht 65.0 in | Wt 148.0 lb

## 2019-01-03 DIAGNOSIS — R319 Hematuria, unspecified: Secondary | ICD-10-CM | POA: Diagnosis not present

## 2019-01-03 DIAGNOSIS — F5105 Insomnia due to other mental disorder: Secondary | ICD-10-CM | POA: Diagnosis not present

## 2019-01-03 DIAGNOSIS — F409 Phobic anxiety disorder, unspecified: Secondary | ICD-10-CM

## 2019-01-03 DIAGNOSIS — E782 Mixed hyperlipidemia: Secondary | ICD-10-CM

## 2019-01-03 DIAGNOSIS — N390 Urinary tract infection, site not specified: Secondary | ICD-10-CM | POA: Diagnosis not present

## 2019-01-03 DIAGNOSIS — J4520 Mild intermittent asthma, uncomplicated: Secondary | ICD-10-CM

## 2019-01-03 DIAGNOSIS — IMO0001 Reserved for inherently not codable concepts without codable children: Secondary | ICD-10-CM

## 2019-01-03 DIAGNOSIS — I10 Essential (primary) hypertension: Secondary | ICD-10-CM

## 2019-01-03 DIAGNOSIS — R300 Dysuria: Secondary | ICD-10-CM | POA: Diagnosis not present

## 2019-01-03 DIAGNOSIS — Z0001 Encounter for general adult medical examination with abnormal findings: Secondary | ICD-10-CM | POA: Diagnosis not present

## 2019-01-03 MED ORDER — ALBUTEROL SULFATE HFA 108 (90 BASE) MCG/ACT IN AERS: 2.0000 | INHALATION_SPRAY | Freq: Four times a day (QID) | RESPIRATORY_TRACT | 3 refills | Status: DC | PRN

## 2019-01-03 MED ORDER — FLUTICASONE-SALMETEROL 250-50 MCG/DOSE IN AEPB: 1.0000 | INHALATION_SPRAY | Freq: Two times a day (BID) | RESPIRATORY_TRACT | 3 refills | Status: DC

## 2019-01-03 MED ORDER — LOSARTAN POTASSIUM 50 MG PO TABS: 50.0000 mg | ORAL_TABLET | Freq: Every day | ORAL | 3 refills | Status: DC

## 2019-01-03 MED ORDER — MONTELUKAST SODIUM 10 MG PO TABS: 10.0000 mg | ORAL_TABLET | Freq: Every day | ORAL | 3 refills | Status: DC

## 2019-01-03 MED ORDER — ZOLPIDEM TARTRATE 10 MG PO TABS: 10.0000 mg | ORAL_TABLET | Freq: Every evening | ORAL | 2 refills | Status: DC | PRN

## 2019-01-03 MED ORDER — ALPRAZOLAM 0.25 MG PO TABS: 0.2500 mg | ORAL_TABLET | Freq: Three times a day (TID) | ORAL | 2 refills | Status: DC | PRN

## 2019-01-03 MED ORDER — AMLODIPINE BESYLATE 5 MG PO TABS: ORAL_TABLET | ORAL | 3 refills | Status: DC

## 2019-01-03 MED ORDER — ATORVASTATIN CALCIUM 10 MG PO TABS: 10.0000 mg | ORAL_TABLET | Freq: Every day | ORAL | 3 refills | Status: DC

## 2019-01-03 MED ORDER — HYDRALAZINE HCL 25 MG PO TABS: 25.0000 mg | ORAL_TABLET | Freq: Every day | ORAL | 3 refills | Status: DC

## 2019-01-03 MED ORDER — NITROFURANTOIN MONOHYD MACRO 100 MG PO CAPS: 100.0000 mg | ORAL_CAPSULE | Freq: Two times a day (BID) | ORAL | 0 refills | Status: DC

## 2019-01-03 NOTE — Progress Notes (Signed)
[facility] [place], [place] [address]  Internal MEDICINE  Office Visit Note  Patient Name: Michele Summers  [id number]  [id number]  Date of Service: 01/06/2019   Pt is here for routine health maintenance examination   Chief Complaint  Patient presents with  . Annual Exam  . Hypertension  . Hyperlipidemia  . Anxiety     The patient is here for health maintenance exam. She has intermittent episodes of urinary frequency and urgency. She is treated for chronic UTI. She takes cipro 500mg  every other day. The symptoms occur despite treatment with antibiotics.  She states that her eyes have been giving her some trouble. She does have appointment with her eye doctor early next month.  She has long history of generalized anxiety. Is prescribed alprazolam TID prn. She does need to have a new prescription for this today. She also needs to have new prescription for ambien. Her blood pressure is generally well controlled.    Current Medication: Outpatient Encounter Medications as of 01/03/2019  Medication Sig  . albuterol (VENTOLIN HFA) 108 (90 Base) MCG/ACT inhaler Inhale 2 puffs into the lungs every 6 (six) hours as needed. For shortness of breath  . albuterol-ipratropium (COMBIVENT) 18-103 MCG/ACT inhaler Inhale into the lungs every 4 (four) hours.  . ALPRAZolam (XANAX) 0.25 MG tablet Take 1 tablet (0.25 mg total) by mouth 3 (three) times daily as needed for anxiety.  Marland Kitchen amLODipine (NORVASC) 5 MG tablet TAKE ONE TABLET BY MOUTH AT BEDTIME FOR HTN  . atorvastatin (LIPITOR) 10 MG tablet Take 1 tablet (10 mg total) by mouth daily. AM  . brimonidine-timolol (COMBIGAN) 0.2-0.5 % ophthalmic solution Place 1 drop into both eyes every 12 (twelve) hours.  . ciprofloxacin (CIPRO) 500 MG tablet Take 1 tablet (500 mg total) by mouth every other day.  . Difluprednate (DUREZOL) 0.05 % EMUL Apply to eye.  Marland Kitchen Fluticasone-Salmeterol (ADVAIR DISKUS) 250-50 MCG/DOSE AEPB Inhale 1  puff into the lungs 2 (two) times daily.  . hydrALAZINE (APRESOLINE) 25 MG tablet Take 1 tablet (25 mg total) by mouth daily.  Marland Kitchen KRILL OIL PO Take by mouth.  . losartan (COZAAR) 50 MG tablet Take 1 tablet (50 mg total) by mouth daily.  . montelukast (SINGULAIR) 10 MG tablet Take 1 tablet (10 mg total) by mouth daily.  Marland Kitchen moxifloxacin (VIGAMOX) 0.5 % ophthalmic solution 1 drop 3 (three) times daily.  . Multiple Vitamins-Minerals (PRESERVISION AREDS PO) Take by mouth. AM  . nepafenac (ILEVRO) 0.3 % ophthalmic suspension 1 drop daily.  . travoprost, benzalkonium, (TRAVATAN) 0.004 % ophthalmic solution 1 drop at bedtime.  . vitamin B-12 (CYANOCOBALAMIN) 500 MCG tablet Take 500 mcg by mouth daily.  Marland Kitchen zolpidem (AMBIEN) 10 MG tablet Take 1 tablet (10 mg total) by mouth at bedtime as needed for sleep.  . [DISCONTINUED] albuterol (PROVENTIL HFA;VENTOLIN HFA) 108 (90 Base) MCG/ACT inhaler Inhale 2 puffs into the lungs every 6 (six) hours as needed. For shortness of breath  . [DISCONTINUED] ALPRAZolam (XANAX) 0.25 MG tablet Take 1 tablet (0.25 mg total) by mouth 3 (three) times daily as needed for anxiety.  . [DISCONTINUED] amLODipine (NORVASC) 5 MG tablet TAKE ONE TABLET BY MOUTH AT BEDTIME FOR HTN  . [DISCONTINUED] atorvastatin (LIPITOR) 10 MG tablet Take 1 tablet (10 mg total) by mouth daily. AM  . [DISCONTINUED] Fluticasone-Salmeterol (ADVAIR DISKUS) 250-50 MCG/DOSE AEPB Inhale 1 puff into the lungs 2 (two) times daily.  . [DISCONTINUED] hydrALAZINE (APRESOLINE) 25 MG tablet Take 1 tablet (  25 mg total) by mouth daily.  . [DISCONTINUED] losartan (COZAAR) 50 MG tablet Take 1 tablet (50 mg total) by mouth daily.  . [DISCONTINUED] montelukast (SINGULAIR) 10 MG tablet Take 1 tablet (10 mg total) by mouth daily.  . [DISCONTINUED] zolpidem (AMBIEN) 10 MG tablet Take 1 tablet (10 mg total) by mouth at bedtime as needed for sleep.  . nitrofurantoin, macrocrystal-monohydrate, (MACROBID) 100 MG capsule Take 1  capsule (100 mg total) by mouth 2 (two) times daily.  . [DISCONTINUED] senna (SENOKOT) 8.6 MG TABS Take 1 tablet (8.6 mg total) by mouth 2 (two) times daily. (Patient not taking: Reported on 01/03/2019)   No facility-administered encounter medications on file as of 01/03/2019.     Surgical History: Past Surgical History:  Procedure Laterality Date  . ABDOMINAL HYSTERECTOMY    . BLADDER SURGERY    . BRAIN SURGERY    . CARDIAC CATHETERIZATION    . CATARACT EXTRACTION W/PHACO Left 09/15/2014   Procedure: CATARACT EXTRACTION PHACO AND INTRAOCULAR LENS PLACEMENT (IOC);  Surgeon: Ronnell Freshwater, MD;  Location: [facility];  Service: Ophthalmology;  Laterality: Left;  . CATARACT EXTRACTION W/PHACO Right 03/23/2015   Procedure: CATARACT EXTRACTION PHACO AND INTRAOCULAR LENS PLACEMENT (IOC);  Surgeon: Ronnell Freshwater, MD;  Location: [facility];  Service: Ophthalmology;  Laterality: Right;  . CHOLECYSTECTOMY    . CRANIOTOMY    . INSERT / REPLACE / REMOVE PACEMAKER    . JOINT REPLACEMENT     TOTAL HIP    Medical History: Past Medical History:  Diagnosis Date  . Arthritis    Hands, feet, jaw  . Asthma   . CHF (congestive heart failure) ([place])   . Depression   . DVT (deep venous thrombosis) ([place])   . Dysrhythmia    A-FIB  . Fracture of hip ([place])    right  . Full dentures    UPPER AND LOWER  . Headache(784.0)   . Hypercholesterolemia   . Hypertension    CONTROLLED ON MEDS  . PE (pulmonary embolism)   . Presence of permanent cardiac pacemaker   . Stroke (cerebrum) ([facility])    DIFFICULT FOR HER TO COMPLETE HER SENTENCES,RESPONDS SLOWLY  . Stroke [facility]) 2006   CVA-CEREBRAL ANEURYSM  . UTI (urinary tract infection)    FREQUENT    Family History: Family History  Problem Relation Age of Onset  . Asthma Mother   . Hypertension Mother   . Heart disease Father       Review of Systems  Constitutional: Negative for activity change, chills, fatigue and  unexpected weight change.       Improved energy levels.   HENT: Negative for congestion, postnasal drip, rhinorrhea, sneezing and sore throat.   Respiratory: Negative for cough, chest tightness, shortness of breath and wheezing.   Cardiovascular: Negative for chest pain and palpitations.       Patient has a pacemaker.   Gastrointestinal: Negative for abdominal pain, constipation, diarrhea, nausea and vomiting.  Endocrine: Negative for cold intolerance, heat intolerance, polydipsia and polyuria.  Genitourinary: Positive for frequency and urgency.       Frequent UTI   Musculoskeletal: Positive for gait problem. Negative for arthralgias, back pain, joint swelling and neck pain.  Skin: Negative for rash.  Allergic/Immunologic: Positive for environmental allergies.  Neurological: Positive for dizziness and weakness. Negative for tremors, numbness and headaches.  Hematological: Negative for adenopathy. Does not bruise/bleed easily.  Psychiatric/Behavioral: Positive for dysphoric mood. Negative for behavioral problems (Depression), sleep disturbance and  suicidal ideas. The patient is nervous/anxious.        Increased depression. Reports feeling lonely sometimes.     Today's Vitals   01/03/19 1409  BP: (!) 148/76  Pulse: 99  Resp: 16  SpO2: 97%  Weight: 148 lb (67.1 kg)  Height: 5\' 5"  (1.651 m)   Body mass index is 24.63 kg/m.   Physical Exam Vitals signs and nursing note reviewed.  Constitutional:      Appearance: Normal appearance. She is well-developed.  HENT:     Head: Normocephalic and atraumatic.     Nose: Nose normal.  Eyes:     Conjunctiva/sclera: Conjunctivae normal.     Pupils: Pupils are equal, round, and reactive to light.  Neck:     Musculoskeletal: Normal range of motion and neck supple.     Thyroid: No thyromegaly.     Vascular: No carotid bruit or JVD.     Trachea: No tracheal deviation.  Cardiovascular:     Rate and Rhythm: Normal rate. Rhythm irregular.      Heart sounds: Murmur present. Systolic murmur present with a grade of 2/6.  Pulmonary:     Effort: Pulmonary effort is normal.     Breath sounds: Normal breath sounds. No wheezing.  Abdominal:     General: Bowel sounds are normal.     Palpations: Abdomen is soft.     Tenderness: There is no abdominal tenderness.  Musculoskeletal: Normal range of motion.  Lymphadenopathy:     Cervical: No cervical adenopathy.  Skin:    General: Skin is warm and dry.     Capillary Refill: Capillary refill takes 2 to 3 seconds.  Neurological:     Mental Status: She is alert and oriented to person, place, and time.     Comments: She is at her neurological [place].   Psychiatric:        Behavior: Behavior normal.        Thought Content: Thought content normal.        Judgment: Judgment normal.    Depression screen [facility] 2/9 01/03/2019 07/13/2018 04/10/2018 01/08/2018 12/22/2017  Decreased Interest 1 0 0 0 0  Down, Depressed, Hopeless 1 0 0 0 0  PHQ - 2 Score 2 0 0 0 0  Altered sleeping 3 - - - -  Tired, decreased energy 1 - - - -  Change in appetite 0 - - - -  Feeling bad or failure about yourself  0 - - - -  Trouble concentrating 0 - - - -  Moving slowly or fidgety/restless 1 - - - -  Suicidal thoughts 0 - - - -  PHQ-9 Score 7 - - - -  Difficult doing work/chores Somewhat difficult - - - -    Functional Status Survey: Is the patient deaf or have difficulty hearing?: No Does the patient have difficulty seeing, even when wearing glasses/contacts?: Yes Does the patient have difficulty concentrating, remembering, or making decisions?: No Does the patient have difficulty walking or climbing stairs?: Yes(pt has a walker) Does the patient have difficulty dressing or bathing?: No Does the patient have difficulty doing errands alone such as visiting a doctor's office or shopping?: Yes  MMSE - [place] Exam 01/03/2019 12/22/2017  Orientation to time 5 5  Orientation to Place 5 5  Registration 3 3   Attention/ Calculation 5 5  Recall 3 3  Language- name 2 objects 2 2  Language- repeat 1 1  Language- follow 3 step command 3  3  Language- read & follow direction 1 0  Write a sentence 1 0  Copy design 1 1  Total score 30 28    Fall Risk  01/03/2019 07/13/2018 04/10/2018 01/08/2018 12/22/2017  Falls in the past year? 1 1 0 No No  Comment - - - - -  Number falls in past yr: 0 0 - - -  Injury with Fall? 0 0 - - -     LABS: Recent Results (from the past 2160 hour(s))  CULTURE, URINE COMPREHENSIVE     Status: None   Collection Time: 01/03/19  2:15 AM   Specimen: Urine   URINE  Result Value Ref Range   Urine Culture, Comprehensive Final report    Organism ID, Bacteria Comment     Comment: Mixed urogenital flora 10,000-25,000 colony forming units per mL     Assessment/Plan: 1. Encounter for general adult medical examination with abnormal findings Annual health maintenance exam today.  2. Urinary tract infection with hematuria, site unspecified Start macrobid 100mg  bid for 7 days. Hold cipro while taking macrobid. Send urine sample for culture and sensitivity and adjust antibiotics as indicated.  - nitrofurantoin, macrocrystal-monohydrate, (MACROBID) 100 MG capsule; Take 1 capsule (100 mg total) by mouth 2 (two) times daily.  Dispense: 14 capsule; Refill: 0  3. Insomnia due to anxiety and fear May take alprazolam 0.25mg  up to three times daily as needed for anxiety and worry. Take zolpidem 10mg  at bedtime as needed for insomnia related to anxiety. New prescriptions for both were sent to her pharamcy.  - ALPRAZolam (XANAX) 0.25 MG tablet; Take 1 tablet (0.25 mg total) by mouth 3 (three) times daily as needed for anxiety.  Dispense: 90 tablet; Refill: 2 - zolpidem (AMBIEN) 10 MG tablet; Take 1 tablet (10 mg total) by mouth at bedtime as needed for sleep.  Dispense: 30 tablet; Refill: 2  4. Essential hypertension Stable. Continue bp medication as prescribed  - amLODipine (NORVASC)  5 MG tablet; TAKE ONE TABLET BY MOUTH AT BEDTIME FOR HTN  Dispense: 90 tablet; Refill: 3 - hydrALAZINE (APRESOLINE) 25 MG tablet; Take 1 tablet (25 mg total) by mouth daily.  Dispense: 90 tablet; Refill: 3 - losartan (COZAAR) 50 MG tablet; Take 1 tablet (50 mg total) by mouth daily.  Dispense: 90 tablet; Refill: 3  5. Moderate intermittent asthma without complication Continue inhalers as prescribed.  - albuterol (VENTOLIN HFA) 108 (90 Base) MCG/ACT inhaler; Inhale 2 puffs into the lungs every 6 (six) hours as needed. For shortness of breath  Dispense: 54 g; Refill: 3 - Fluticasone-Salmeterol (ADVAIR DISKUS) 250-50 MCG/DOSE AEPB; Inhale 1 puff into the lungs 2 (two) times daily.  Dispense: 3 each; Refill: 3 - montelukast (SINGULAIR) 10 MG tablet; Take 1 tablet (10 mg total) by mouth daily.  Dispense: 90 tablet; Refill: 3  6. Mixed hyperlipidemia - atorvastatin (LIPITOR) 10 MG tablet; Take 1 tablet (10 mg total) by mouth daily. AM  Dispense: 90 tablet; Refill: 3  7. Dysuria - POCT Urinalysis Dipstick - CULTURE, URINE COMPREHENSIVE  General Counseling: Coraleigh verbalizes understanding of the findings of todays visit and agrees with plan of treatment. I have discussed any further diagnostic evaluation that may be needed or ordered today. We also reviewed her medications today. she has been encouraged to call the office with any questions or concerns that should arise related to todays visit.    Counseling:  This patient was seen by Leretha Pol FNP Collaboration with Dr [name] as a  part of collaborative care agreement  Orders Placed This Encounter  Procedures  . CULTURE, URINE COMPREHENSIVE  . POCT Urinalysis Dipstick    Meds ordered this encounter  Medications  . nitrofurantoin, macrocrystal-monohydrate, (MACROBID) 100 MG capsule    Sig: Take 1 capsule (100 mg total) by mouth 2 (two) times daily.    Dispense:  14 capsule    Refill:  0    Order Specific Question:   Supervising  Provider    Answer:   [name] [[id number]]  . ALPRAZolam (XANAX) 0.25 MG tablet    Sig: Take 1 tablet (0.25 mg total) by mouth 3 (three) times daily as needed for anxiety.    Dispense:  90 tablet    Refill:  2    Order Specific Question:   Supervising Provider    Answer:   [name] [[id number]]  . zolpidem (AMBIEN) 10 MG tablet    Sig: Take 1 tablet (10 mg total) by mouth at bedtime as needed for sleep.    Dispense:  30 tablet    Refill:  2    Patient to use GoodRx card I provided her. Ok to fill today so it can be picked up with other medication.    Order Specific Question:   Supervising Provider    Answer:   [name] [[id number]]  . albuterol (VENTOLIN HFA) 108 (90 Base) MCG/ACT inhaler    Sig: Inhale 2 puffs into the lungs every 6 (six) hours as needed. For shortness of breath    Dispense:  54 g    Refill:  3    Order Specific Question:   Supervising Provider    Answer:   [name] [[id number]]  . amLODipine (NORVASC) 5 MG tablet    Sig: TAKE ONE TABLET BY MOUTH AT BEDTIME FOR HTN    Dispense:  90 tablet    Refill:  3    Order Specific Question:   Supervising Provider    Answer:   [name] [[id number]]  . atorvastatin (LIPITOR) 10 MG tablet    Sig: Take 1 tablet (10 mg total) by mouth daily. AM    Dispense:  90 tablet    Refill:  3    Order Specific Question:   Supervising Provider    Answer:   [name] [[id number]]  . Fluticasone-Salmeterol (ADVAIR DISKUS) 250-50 MCG/DOSE AEPB    Sig: Inhale 1 puff into the lungs 2 (two) times daily.    Dispense:  3 each    Refill:  3    Order Specific Question:   Supervising Provider    Answer:   [name] [[id number]]  . hydrALAZINE (APRESOLINE) 25 MG tablet    Sig: Take 1 tablet (25 mg total) by mouth daily.    Dispense:  90 tablet    Refill:  3    Order Specific Question:   Supervising Provider    Answer:   [name] [[id number]]  . losartan (COZAAR) 50 MG tablet    Sig: Take 1 tablet (50 mg total) by mouth daily.    Dispense:  90 tablet     Refill:  3    Order Specific Question:   Supervising Provider    Answer:   [name] [[id number]]  . montelukast (SINGULAIR) 10 MG tablet    Sig: Take 1 tablet (10 mg total) by mouth daily.    Dispense:  90 tablet    Refill:  3    Order  Specific Question:   Supervising Provider    Answer:   [name] [[id number]]    Time spent: [place], MD  Internal Medicine

## 2019-01-06 LAB — CULTURE, URINE COMPREHENSIVE

## 2019-01-06 NOTE — Progress Notes (Signed)
Patient started on macrobid bid for 7 days. Will restart cipro every other day after completed cipro.

## 2019-01-07 LAB — POCT URINALYSIS DIPSTICK
Bilirubin, UA: 0.2
Blood, UA: NEGATIVE
Glucose, UA: NEGATIVE
Ketones, UA: NEGATIVE
Nitrite, UA: NEGATIVE
Protein, UA: NEGATIVE
Spec Grav, UA: 1.01 (ref 1.010–1.025)
Urobilinogen, UA: 0.2 E.U./dL
pH, UA: 5 (ref 5.0–8.0)

## 2019-01-09 DIAGNOSIS — H353211 Exudative age-related macular degeneration, right eye, with active choroidal neovascularization: Secondary | ICD-10-CM | POA: Diagnosis not present

## 2019-02-01 ENCOUNTER — Other Ambulatory Visit: Payer: Self-pay | Admitting: Internal Medicine

## 2019-02-01 ENCOUNTER — Other Ambulatory Visit: Payer: Self-pay | Admitting: Nurse Practitioner

## 2019-02-01 ENCOUNTER — Other Ambulatory Visit: Payer: Self-pay

## 2019-02-01 DIAGNOSIS — J4520 Mild intermittent asthma, uncomplicated: Secondary | ICD-10-CM

## 2019-02-01 DIAGNOSIS — IMO0001 Reserved for inherently not codable concepts without codable children: Secondary | ICD-10-CM

## 2019-02-01 DIAGNOSIS — I10 Essential (primary) hypertension: Secondary | ICD-10-CM

## 2019-02-01 DIAGNOSIS — E782 Mixed hyperlipidemia: Secondary | ICD-10-CM

## 2019-02-01 DIAGNOSIS — N390 Urinary tract infection, site not specified: Secondary | ICD-10-CM

## 2019-02-01 MED ORDER — ATORVASTATIN CALCIUM 10 MG PO TABS: 10.0000 mg | ORAL_TABLET | Freq: Every day | ORAL | 3 refills | Status: DC

## 2019-02-01 MED ORDER — ALBUTEROL SULFATE HFA 108 (90 BASE) MCG/ACT IN AERS: 2.0000 | INHALATION_SPRAY | Freq: Four times a day (QID) | RESPIRATORY_TRACT | 3 refills | Status: DC | PRN

## 2019-02-01 MED ORDER — FLUTICASONE-SALMETEROL 250-50 MCG/DOSE IN AEPB: 1.0000 | INHALATION_SPRAY | Freq: Two times a day (BID) | RESPIRATORY_TRACT | 3 refills | Status: DC

## 2019-02-01 MED ORDER — LOSARTAN POTASSIUM 50 MG PO TABS: 50.0000 mg | ORAL_TABLET | Freq: Every day | ORAL | 3 refills | Status: DC

## 2019-02-01 MED ORDER — MONTELUKAST SODIUM 10 MG PO TABS: 10.0000 mg | ORAL_TABLET | Freq: Every day | ORAL | 3 refills | Status: DC

## 2019-02-01 MED ORDER — HYDRALAZINE HCL 25 MG PO TABS: 25.0000 mg | ORAL_TABLET | Freq: Every day | ORAL | 3 refills | Status: DC

## 2019-02-01 MED ORDER — AMLODIPINE BESYLATE 5 MG PO TABS: ORAL_TABLET | ORAL | 3 refills | Status: DC

## 2019-02-04 ENCOUNTER — Other Ambulatory Visit: Payer: Self-pay

## 2019-02-04 ENCOUNTER — Other Ambulatory Visit: Payer: Self-pay | Admitting: Nurse Practitioner

## 2019-02-04 ENCOUNTER — Telehealth: Payer: Self-pay

## 2019-02-04 DIAGNOSIS — F411 Generalized anxiety disorder: Secondary | ICD-10-CM

## 2019-02-04 DIAGNOSIS — N390 Urinary tract infection, site not specified: Secondary | ICD-10-CM

## 2019-02-04 DIAGNOSIS — F409 Phobic anxiety disorder, unspecified: Secondary | ICD-10-CM

## 2019-02-04 DIAGNOSIS — N959 Unspecified menopausal and perimenopausal disorder: Secondary | ICD-10-CM

## 2019-02-04 MED ORDER — CIPROFLOXACIN HCL 500 MG PO TABS: 500.0000 mg | ORAL_TABLET | ORAL | 3 refills | Status: DC

## 2019-02-04 MED ORDER — ESTROGENS, CONJUGATED 0.625 MG/GM VA CREA: 1.0000 | TOPICAL_CREAM | VAGINAL | 3 refills | Status: DC

## 2019-02-04 MED ORDER — FLUOXETINE HCL 20 MG PO CAPS: 20.0000 mg | ORAL_CAPSULE | Freq: Every day | ORAL | 3 refills | Status: DC

## 2019-02-04 NOTE — Progress Notes (Signed)
Refilled prescriptions for fluoxetine and premarin cream. Sent to [facility].

## 2019-02-04 NOTE — Telephone Encounter (Signed)
Refilled prescriptions for fluoxetine and premarin cream. Sent to [facility].

## 2019-02-05 ENCOUNTER — Other Ambulatory Visit: Payer: Self-pay | Admitting: Nurse Practitioner

## 2019-02-05 ENCOUNTER — Telehealth: Payer: Self-pay

## 2019-02-05 DIAGNOSIS — F409 Phobic anxiety disorder, unspecified: Secondary | ICD-10-CM

## 2019-02-05 MED ORDER — ZOLPIDEM TARTRATE 10 MG PO TABS: 10.0000 mg | ORAL_TABLET | Freq: Every evening | ORAL | 2 refills | Status: DC | PRN

## 2019-02-05 NOTE — Progress Notes (Signed)
Refilled ambien 10mg  qhs prn per pharmacy request.

## 2019-02-05 NOTE — Telephone Encounter (Signed)
Pt advised we send med to her fluoxetine [facility]in [facility]

## 2019-02-27 ENCOUNTER — Other Ambulatory Visit: Payer: Self-pay | Admitting: Nurse Practitioner

## 2019-02-27 DIAGNOSIS — F411 Generalized anxiety disorder: Secondary | ICD-10-CM

## 2019-03-21 DIAGNOSIS — Z950 Presence of cardiac pacemaker: Secondary | ICD-10-CM | POA: Diagnosis not present

## 2019-03-21 DIAGNOSIS — Z882 Allergy status to sulfonamides status: Secondary | ICD-10-CM | POA: Diagnosis not present

## 2019-03-21 DIAGNOSIS — I10 Essential (primary) hypertension: Secondary | ICD-10-CM | POA: Diagnosis not present

## 2019-03-21 DIAGNOSIS — G459 Transient cerebral ischemic attack, unspecified: Secondary | ICD-10-CM | POA: Diagnosis not present

## 2019-04-03 ENCOUNTER — Telehealth: Payer: Self-pay

## 2019-04-03 NOTE — Telephone Encounter (Signed)
CONFIRMED AND SCREENED FOR 04-05-19 OV.

## 2019-04-05 ENCOUNTER — Ambulatory Visit (INDEPENDENT_AMBULATORY_CARE_PROVIDER_SITE_OTHER): Payer: Medicare Other | Admitting: Nurse Practitioner

## 2019-04-05 ENCOUNTER — Other Ambulatory Visit: Payer: Self-pay

## 2019-04-05 ENCOUNTER — Encounter: Payer: Self-pay | Admitting: Nurse Practitioner

## 2019-04-05 DIAGNOSIS — F409 Phobic anxiety disorder, unspecified: Secondary | ICD-10-CM

## 2019-04-05 DIAGNOSIS — N390 Urinary tract infection, site not specified: Secondary | ICD-10-CM

## 2019-04-05 DIAGNOSIS — F411 Generalized anxiety disorder: Secondary | ICD-10-CM | POA: Diagnosis not present

## 2019-04-05 DIAGNOSIS — E782 Mixed hyperlipidemia: Secondary | ICD-10-CM | POA: Diagnosis not present

## 2019-04-05 DIAGNOSIS — F5105 Insomnia due to other mental disorder: Secondary | ICD-10-CM | POA: Diagnosis not present

## 2019-04-05 DIAGNOSIS — I10 Essential (primary) hypertension: Secondary | ICD-10-CM

## 2019-04-05 MED ORDER — AMLODIPINE BESYLATE 5 MG PO TABS: ORAL_TABLET | ORAL | 3 refills | Status: DC

## 2019-04-05 MED ORDER — HYDRALAZINE HCL 25 MG PO TABS: 25.0000 mg | ORAL_TABLET | Freq: Every day | ORAL | 3 refills | Status: DC

## 2019-04-05 MED ORDER — ZOLPIDEM TARTRATE 10 MG PO TABS: 10.0000 mg | ORAL_TABLET | Freq: Every evening | ORAL | 2 refills | Status: DC | PRN

## 2019-04-05 MED ORDER — SOTALOL HCL 80 MG PO TABS: 80.0000 mg | ORAL_TABLET | Freq: Every day | ORAL | 3 refills | Status: DC

## 2019-04-05 MED ORDER — ATORVASTATIN CALCIUM 10 MG PO TABS: 10.0000 mg | ORAL_TABLET | Freq: Every day | ORAL | 3 refills | Status: DC

## 2019-04-05 MED ORDER — LOSARTAN POTASSIUM 50 MG PO TABS: 50.0000 mg | ORAL_TABLET | Freq: Every day | ORAL | 3 refills | Status: DC

## 2019-04-05 MED ORDER — ALPRAZOLAM 0.25 MG PO TABS: 0.2500 mg | ORAL_TABLET | Freq: Three times a day (TID) | ORAL | 2 refills | Status: DC | PRN

## 2019-04-05 MED ORDER — CIPROFLOXACIN HCL 500 MG PO TABS: 500.0000 mg | ORAL_TABLET | ORAL | 3 refills | Status: DC

## 2019-04-05 MED ORDER — FLUOXETINE HCL 20 MG PO CAPS: 20.0000 mg | ORAL_CAPSULE | Freq: Every day | ORAL | 3 refills | Status: DC

## 2019-04-05 NOTE — Progress Notes (Signed)
[facility] [place], [place] [address]  Internal MEDICINE  Office Visit Note  Patient Name: Michele Summers  [id number]  [id number]  Date of Service: 04/14/2019  Chief Complaint  Patient presents with  . Hypertension    went to heart doctor and checked pacemaker and said that the heart is slowing down sometimes   . Hyperlipidemia    The patient is here for routine follow up visit. She is very upset as her son is currently hospitalized in ICU. The patient states that she is doing well, overall. Her blood pressure is generally well managed. She has not had flare of UTI type symptoms in some time. She does need to have refills of her routine medications today.       Current Medication: Outpatient Encounter Medications as of 04/05/2019  Medication Sig  . albuterol (VENTOLIN HFA) 108 (90 Base) MCG/ACT inhaler Inhale 2 puffs into the lungs every 6 (six) hours as needed. For shortness of breath  . albuterol-ipratropium (COMBIVENT) 18-103 MCG/ACT inhaler Inhale into the lungs every 4 (four) hours.  . ALPRAZolam (XANAX) 0.25 MG tablet Take 1 tablet (0.25 mg total) by mouth 3 (three) times daily as needed for anxiety.  Marland Kitchen amLODipine (NORVASC) 5 MG tablet TAKE ONE TABLET BY MOUTH AT BEDTIME FOR HTN  . atorvastatin (LIPITOR) 10 MG tablet Take 1 tablet (10 mg total) by mouth daily. AM  . brimonidine-timolol (COMBIGAN) 0.2-0.5 % ophthalmic solution Place 1 drop into both eyes every 12 (twelve) hours.  . ciprofloxacin (CIPRO) 500 MG tablet Take 1 tablet (500 mg total) by mouth every other day.  . conjugated estrogens (PREMARIN) vaginal cream Place 1 Applicatorful vaginally 2 (two) times a week.  . Difluprednate (DUREZOL) 0.05 % EMUL Apply to eye.  Marland Kitchen FLUoxetine (PROZAC) 20 MG capsule Take 1 capsule (20 mg total) by mouth daily. AM  . Fluticasone-Salmeterol (ADVAIR DISKUS) 250-50 MCG/DOSE AEPB Inhale 1 puff into the lungs 2 (two) times daily.  . hydrALAZINE (APRESOLINE) 25  MG tablet Take 1 tablet (25 mg total) by mouth daily.  Marland Kitchen KRILL OIL PO Take by mouth.  . losartan (COZAAR) 50 MG tablet Take 1 tablet (50 mg total) by mouth daily.  . montelukast (SINGULAIR) 10 MG tablet Take 1 tablet (10 mg total) by mouth daily.  Marland Kitchen moxifloxacin (VIGAMOX) 0.5 % ophthalmic solution 1 drop 3 (three) times daily.  . Multiple Vitamins-Minerals (PRESERVISION AREDS PO) Take by mouth. AM  . nepafenac (ILEVRO) 0.3 % ophthalmic suspension 1 drop daily.  . nitrofurantoin, macrocrystal-monohydrate, (MACROBID) 100 MG capsule Take 1 capsule (100 mg total) by mouth 2 (two) times daily.  . travoprost, benzalkonium, (TRAVATAN) 0.004 % ophthalmic solution 1 drop at bedtime.  . vitamin B-12 (CYANOCOBALAMIN) 500 MCG tablet Take 500 mcg by mouth daily.  Marland Kitchen zolpidem (AMBIEN) 10 MG tablet Take 1 tablet (10 mg total) by mouth at bedtime as needed for sleep.  . [DISCONTINUED] ALPRAZolam (XANAX) 0.25 MG tablet Take 1 tablet (0.25 mg total) by mouth 3 (three) times daily as needed for anxiety.  . [DISCONTINUED] amLODipine (NORVASC) 5 MG tablet TAKE ONE TABLET BY MOUTH AT BEDTIME FOR HTN  . [DISCONTINUED] atorvastatin (LIPITOR) 10 MG tablet Take 1 tablet (10 mg total) by mouth daily. AM  . [DISCONTINUED] ciprofloxacin (CIPRO) 500 MG tablet Take 1 tablet (500 mg total) by mouth every other day.  . [DISCONTINUED] FLUoxetine (PROZAC) 20 MG capsule TAKE 1 CAPSULE (20 MG TOTAL) BY MOUTH DAILY. AM  . [DISCONTINUED] hydrALAZINE (  APRESOLINE) 25 MG tablet Take 1 tablet (25 mg total) by mouth daily.  . [DISCONTINUED] losartan (COZAAR) 50 MG tablet Take 1 tablet (50 mg total) by mouth daily.  . [DISCONTINUED] zolpidem (AMBIEN) 10 MG tablet Take 1 tablet (10 mg total) by mouth at bedtime as needed for sleep.  . sotalol (BETAPACE) 80 MG tablet Take 1 tablet (80 mg total) by mouth daily.   No facility-administered encounter medications on file as of 04/05/2019.     Surgical History: Past Surgical History:   Procedure Laterality Date  . ABDOMINAL HYSTERECTOMY    . BLADDER SURGERY    . BRAIN SURGERY    . CARDIAC CATHETERIZATION    . CATARACT EXTRACTION W/PHACO Left 09/15/2014   Procedure: CATARACT EXTRACTION PHACO AND INTRAOCULAR LENS PLACEMENT (IOC);  Surgeon: Ronnell Freshwater, MD;  Location: [place];  Service: Ophthalmology;  Laterality: Left;  . CATARACT EXTRACTION W/PHACO Right 03/23/2015   Procedure: CATARACT EXTRACTION PHACO AND INTRAOCULAR LENS PLACEMENT (IOC);  Surgeon: Ronnell Freshwater, MD;  Location: [facility];  Service: Ophthalmology;  Laterality: Right;  . CHOLECYSTECTOMY    . CRANIOTOMY    . INSERT / REPLACE / REMOVE PACEMAKER    . JOINT REPLACEMENT     TOTAL HIP    Medical History: Past Medical History:  Diagnosis Date  . Arthritis    Hands, feet, jaw  . Asthma   . CHF (congestive heart failure) ([place])   . Depression   . DVT (deep venous thrombosis) ([place])   . Dysrhythmia    A-FIB  . Fracture of hip ([place])    right  . Full dentures    UPPER AND LOWER  . Headache(784.0)   . Hypercholesterolemia   . Hypertension    CONTROLLED ON MEDS  . PE (pulmonary embolism)   . Presence of permanent cardiac pacemaker   . Stroke (cerebrum) ([facility])    DIFFICULT FOR HER TO COMPLETE HER SENTENCES,RESPONDS SLOWLY  . Stroke [facility]) 2006   CVA-CEREBRAL ANEURYSM  . UTI (urinary tract infection)    FREQUENT    Family History: Family History  Problem Relation Age of Onset  . Asthma Mother   . Hypertension Mother   . Heart disease Father     Social History   Socioeconomic History  . Marital status: Married    Spouse name: Not on file  . Number of children: Not on file  . Years of education: Not on file  . Highest education level: Not on file  Occupational History  . Not on file  Social Needs  . Financial resource strain: Not on file  . Food insecurity    Worry: Not on file    Inability: Not on file  . Transportation needs    Medical:  Not on file    Non-medical: Not on file  Tobacco Use  . Smoking status: Never Smoker  . Smokeless tobacco: Never Used  Substance and Sexual Activity  . Alcohol use: No  . Drug use: Never  . Sexual activity: Never  Lifestyle  . Physical activity    Days per week: Not on file    Minutes per session: Not on file  . Stress: Not on file  Relationships  . Social Herbalist on phone: Not on file    Gets together: Not on file    Attends religious service: Not on file    Active member of club or organization: Not on file    Attends meetings of  clubs or organizations: Not on file    Relationship status: Not on file  . Intimate partner violence    Fear of current or ex partner: Not on file    Emotionally abused: Not on file    Physically abused: Not on file    Forced sexual activity: Not on file  Other Topics Concern  . Not on file  Social History Narrative  . Not on file      Review of Systems  Constitutional: Negative for activity change, chills, fatigue and unexpected weight change.       Improved energy levels.   HENT: Negative for congestion, postnasal drip, rhinorrhea, sneezing and sore throat.   Respiratory: Negative for cough, chest tightness, shortness of breath and wheezing.   Cardiovascular: Negative for chest pain and palpitations.       Patient has a pacemaker.   Gastrointestinal: Negative for abdominal pain, constipation, diarrhea, nausea and vomiting.  Endocrine: Negative for cold intolerance, heat intolerance, polydipsia and polyuria.  Musculoskeletal: Positive for gait problem. Negative for arthralgias, back pain, joint swelling and neck pain.  Skin: Negative for rash.  Allergic/Immunologic: Positive for environmental allergies.  Neurological: Positive for dizziness and weakness. Negative for tremors, numbness and headaches.  Hematological: Negative for adenopathy. Does not bruise/bleed easily.  Psychiatric/Behavioral: Positive for dysphoric mood.  Negative for behavioral problems (Depression), sleep disturbance and suicidal ideas. The patient is nervous/anxious.        Increased depression. Reports feeling lonely sometimes.     Today's Vitals   04/05/19 1441  BP: (!) 147/63  Pulse: 69  Resp: 16  Temp: 97.7 F (36.5 C)  SpO2: 99%  Weight: 143 lb 12.8 oz (65.2 kg)  Height: 5\' 5"  (1.651 m)   Body mass index is 23.93 kg/m.  Physical Exam Vitals signs and nursing note reviewed.  Constitutional:      Appearance: Normal appearance. She is well-developed.  HENT:     Head: Normocephalic and atraumatic.     Nose: Nose normal.  Eyes:     Conjunctiva/sclera: Conjunctivae normal.     Pupils: Pupils are equal, round, and reactive to light.  Neck:     Musculoskeletal: Normal range of motion and neck supple.     Thyroid: No thyromegaly.     Vascular: No carotid bruit or JVD.     Trachea: No tracheal deviation.  Cardiovascular:     Rate and Rhythm: Normal rate. Rhythm irregular.     Heart sounds: Murmur present. Systolic murmur present with a grade of 2/6.  Pulmonary:     Effort: Pulmonary effort is normal.     Breath sounds: Normal breath sounds. No wheezing.  Abdominal:     General: Bowel sounds are normal.     Palpations: Abdomen is soft.     Tenderness: There is no abdominal tenderness.  Musculoskeletal: Normal range of motion.  Lymphadenopathy:     Cervical: No cervical adenopathy.  Skin:    General: Skin is warm and dry.     Capillary Refill: Capillary refill takes 2 to 3 seconds.  Neurological:     Mental Status: She is alert and oriented to person, place, and time.     Comments: She is at her neurological [place].   Psychiatric:        Behavior: Behavior normal.        Thought Content: Thought content normal.        Judgment: Judgment normal.    Assessment/Plan: 1. Essential hypertension Generally stable. Continue bp  medications as prescribed  - amLODipine (NORVASC) 5 MG tablet; TAKE ONE TABLET BY MOUTH AT  BEDTIME FOR HTN  Dispense: 90 tablet; Refill: 3 - losartan (COZAAR) 50 MG tablet; Take 1 tablet (50 mg total) by mouth daily.  Dispense: 90 tablet; Refill: 3 - hydrALAZINE (APRESOLINE) 25 MG tablet; Take 1 tablet (25 mg total) by mouth daily.  Dispense: 90 tablet; Refill: 3 - sotalol (BETAPACE) 80 MG tablet; Take 1 tablet (80 mg total) by mouth daily.  Dispense: 90 tablet; Refill: 3  2. Mixed hyperlipidemia Continue atorvastatin as prescribed  - atorvastatin (LIPITOR) 10 MG tablet; Take 1 tablet (10 mg total) by mouth daily. AM  Dispense: 90 tablet; Refill: 3  3. Chronic urinary tract infection Continue prophylactic antibiotics every other day.  - ciprofloxacin (CIPRO) 500 MG tablet; Take 1 tablet (500 mg total) by mouth every other day.  Dispense: 45 tablet; Refill: 3  4. GAD (generalized anxiety disorder) Continue prozac 20mg  daily. My take alprazolam 0.25mg  up to three times daily if needed for acute anxiety. Refills provided today. - FLUoxetine (PROZAC) 20 MG capsule; Take 1 capsule (20 mg total) by mouth daily. AM  Dispense: 90 capsule; Refill: 3 - ALPRAZolam (XANAX) 0.25 MG tablet; Take 1 tablet (0.25 mg total) by mouth 3 (three) times daily as needed for anxiety.  Dispense: 90 tablet; Refill: 2  5. Insomnia due to anxiety and fear May continue to take ambien 10mg  at bedtime if needed for insomnia. Refills provided today. - zolpidem (AMBIEN) 10 MG tablet; Take 1 tablet (10 mg total) by mouth at bedtime as needed for sleep.  Dispense: 30 tablet; Refill: 2   General Counseling: Malia verbalizes understanding of the findings of todays visit and agrees with plan of treatment. I have discussed any further diagnostic evaluation that may be needed or ordered today. We also reviewed her medications today. she has been encouraged to call the office with any questions or concerns that should arise related to todays visit.   Hypertension Counseling:   The following hypertensive lifestyle  modification were recommended and discussed:  1. Limiting alcohol intake to less than 1 oz/day of ethanol:(24 oz of beer or 8 oz of wine or 2 oz of 100-proof whiskey). 2. Take baby ASA 81 mg daily. 3. Importance of regular aerobic exercise and losing weight. 4. Reduce dietary saturated fat and cholesterol intake for overall cardiovascular health. 5. Maintaining adequate dietary potassium, calcium, and magnesium intake. 6. Regular monitoring of the blood pressure. 7. Reduce sodium intake to less than 100 mmol/day (less than 2.3 gm of sodium or less than 6 gm of sodium choride)   This patient was seen by [place] with Dr [name] as a part of collaborative care agreement  Meds ordered this encounter  Medications  . amLODipine (NORVASC) 5 MG tablet    Sig: TAKE ONE TABLET BY MOUTH AT BEDTIME FOR HTN    Dispense:  90 tablet    Refill:  3    Order Specific Question:   Supervising Provider    Answer:   [name] [id number]  . atorvastatin (LIPITOR) 10 MG tablet    Sig: Take 1 tablet (10 mg total) by mouth daily. AM    Dispense:  90 tablet    Refill:  3    Order Specific Question:   Supervising Provider    Answer:   [name] [id number]  . ciprofloxacin (CIPRO) 500 MG tablet    Sig: Take 1  tablet (500 mg total) by mouth every other day.    Dispense:  45 tablet    Refill:  3    To start after she completes bid dosing of cipro    Order Specific Question:   Supervising Provider    Answer:   [name] [place]  . FLUoxetine (PROZAC) 20 MG capsule    Sig: Take 1 capsule (20 mg total) by mouth daily. AM    Dispense:  90 capsule    Refill:  3    Order Specific Question:   Supervising Provider    Answer:   [name] [id number]  . losartan (COZAAR) 50 MG tablet    Sig: Take 1 tablet (50 mg total) by mouth daily.    Dispense:  90 tablet    Refill:  3    Order Specific Question:   Supervising Provider    Answer:   [name] [id number]  . hydrALAZINE (APRESOLINE)  25 MG tablet    Sig: Take 1 tablet (25 mg total) by mouth daily.    Dispense:  90 tablet    Refill:  3    Order Specific Question:   Supervising Provider    Answer:   [name] [id number]  . sotalol (BETAPACE) 80 MG tablet    Sig: Take 1 tablet (80 mg total) by mouth daily.    Dispense:  90 tablet    Refill:  3    Order Specific Question:   Supervising Provider    Answer:   [name] [id number]  . zolpidem (AMBIEN) 10 MG tablet    Sig: Take 1 tablet (10 mg total) by mouth at bedtime as needed for sleep.    Dispense:  30 tablet    Refill:  2    Patient to use GoodRx card I provided her. Ok to fill today so it can be picked up with other medication.    Order Specific Question:   Supervising Provider    Answer:   [name] [id number]  . ALPRAZolam (XANAX) 0.25 MG tablet    Sig: Take 1 tablet (0.25 mg total) by mouth 3 (three) times daily as needed for anxiety.    Dispense:  90 tablet    Refill:  2    Order Specific Question:   Supervising Provider    Answer:   [name] [id number]    Time spent: 42 Minutes      Dr [name] Internal medicine

## 2019-06-20 DIAGNOSIS — I4891 Unspecified atrial fibrillation: Secondary | ICD-10-CM | POA: Diagnosis not present

## 2019-06-20 DIAGNOSIS — Z950 Presence of cardiac pacemaker: Secondary | ICD-10-CM | POA: Diagnosis not present

## 2019-06-20 DIAGNOSIS — H3530 Unspecified macular degeneration: Secondary | ICD-10-CM | POA: Diagnosis not present

## 2019-06-20 DIAGNOSIS — I10 Essential (primary) hypertension: Secondary | ICD-10-CM | POA: Diagnosis not present

## 2019-07-01 ENCOUNTER — Other Ambulatory Visit: Payer: Self-pay

## 2019-07-01 DIAGNOSIS — F409 Phobic anxiety disorder, unspecified: Secondary | ICD-10-CM

## 2019-07-01 MED ORDER — ZOLPIDEM TARTRATE 10 MG PO TABS: 10.0000 mg | ORAL_TABLET | Freq: Every evening | ORAL | 0 refills | Status: DC | PRN

## 2019-07-05 ENCOUNTER — Telehealth: Payer: Self-pay

## 2019-07-05 NOTE — Telephone Encounter (Signed)
CONFIRMED AND SCREENED FOR 07-09-19 OV. 

## 2019-07-09 ENCOUNTER — Ambulatory Visit: Payer: Medicare Other | Admitting: Nurse Practitioner

## 2019-07-09 ENCOUNTER — Other Ambulatory Visit: Payer: Self-pay

## 2019-07-09 ENCOUNTER — Telehealth: Payer: Self-pay

## 2019-07-09 DIAGNOSIS — F409 Phobic anxiety disorder, unspecified: Secondary | ICD-10-CM

## 2019-07-09 NOTE — Telephone Encounter (Signed)
Patient needed to reschedule appointment on 07/09/2019 to 07/15/2019. klh

## 2019-07-10 MED ORDER — ALPRAZOLAM 0.25 MG PO TABS: 0.2500 mg | ORAL_TABLET | Freq: Three times a day (TID) | ORAL | 2 refills | Status: DC | PRN

## 2019-07-10 MED ORDER — ZOLPIDEM TARTRATE 10 MG PO TABS: 10.0000 mg | ORAL_TABLET | Freq: Every evening | ORAL | 2 refills | Status: DC | PRN

## 2019-07-11 ENCOUNTER — Telehealth: Payer: Self-pay

## 2019-07-11 NOTE — Telephone Encounter (Signed)
CONFIRMED AND SCREENED FOR 07-15-19 OV. 

## 2019-07-15 ENCOUNTER — Other Ambulatory Visit: Payer: Self-pay

## 2019-07-15 ENCOUNTER — Encounter: Payer: Self-pay | Admitting: Nurse Practitioner

## 2019-07-15 ENCOUNTER — Ambulatory Visit (INDEPENDENT_AMBULATORY_CARE_PROVIDER_SITE_OTHER): Payer: Medicare Other | Admitting: Nurse Practitioner

## 2019-07-15 VITALS — BP 168/76 | HR 79 | Temp 97.3°F | Resp 16 | Ht 65.0 in | Wt 143.4 lb

## 2019-07-15 DIAGNOSIS — F4329 Adjustment disorder with other symptoms: Secondary | ICD-10-CM | POA: Diagnosis not present

## 2019-07-15 DIAGNOSIS — R300 Dysuria: Secondary | ICD-10-CM | POA: Diagnosis not present

## 2019-07-15 DIAGNOSIS — F411 Generalized anxiety disorder: Secondary | ICD-10-CM

## 2019-07-15 DIAGNOSIS — N390 Urinary tract infection, site not specified: Secondary | ICD-10-CM | POA: Diagnosis not present

## 2019-07-15 DIAGNOSIS — I10 Essential (primary) hypertension: Secondary | ICD-10-CM | POA: Diagnosis not present

## 2019-07-15 DIAGNOSIS — R319 Hematuria, unspecified: Secondary | ICD-10-CM | POA: Diagnosis not present

## 2019-07-15 DIAGNOSIS — H402233 Chronic angle-closure glaucoma, bilateral, severe stage: Secondary | ICD-10-CM | POA: Diagnosis not present

## 2019-07-15 DIAGNOSIS — H353211 Exudative age-related macular degeneration, right eye, with active choroidal neovascularization: Secondary | ICD-10-CM | POA: Diagnosis not present

## 2019-07-15 DIAGNOSIS — F4321 Adjustment disorder with depressed mood: Secondary | ICD-10-CM

## 2019-07-15 LAB — POCT URINALYSIS DIPSTICK
Bilirubin, UA: NEGATIVE
Blood, UA: NEGATIVE
Glucose, UA: NEGATIVE
Ketones, UA: NEGATIVE
Nitrite, UA: NEGATIVE
Protein, UA: NEGATIVE
Spec Grav, UA: 1.01 (ref 1.010–1.025)
Urobilinogen, UA: 0.2 E.U./dL
pH, UA: 5 (ref 5.0–8.0)

## 2019-07-15 MED ORDER — NITROFURANTOIN MONOHYD MACRO 100 MG PO CAPS: 100.0000 mg | ORAL_CAPSULE | Freq: Two times a day (BID) | ORAL | 0 refills | Status: DC

## 2019-07-15 NOTE — Progress Notes (Signed)
[facility], [place] [address]  Internal MEDICINE  Office Visit Note  Patient Name: Michele Summers  [id number]  [id number]  Date of Service: 07/17/2019  Chief Complaint  Patient presents with  . Hypertension  . Hyperlipidemia  . Depression  . Arthritis  . Dysuria    trouble peeing, taking cipro every other night 500 mg     The patient is here for routine follow up. She is tearful during the visit. Has lost three family members pass away since day before thanksgiving. She was recently at funeral of a loved one and started having symptoms of lower abdominal pain and painful urination. She started taking her preventive cipro every other day. This has not helped her symptoms and they are gradually getting worse.  Has seen her heart doctor. Pacemaker is doing well. Is in arrhythmia more than she is not. Feels ok. Just getting very tired very easily.       Current Medication: Outpatient Encounter Medications as of 07/15/2019  Medication Sig  . albuterol (VENTOLIN HFA) 108 (90 Base) MCG/ACT inhaler Inhale 2 puffs into the lungs every 6 (six) hours as needed. For shortness of breath  . albuterol-ipratropium (COMBIVENT) 18-103 MCG/ACT inhaler Inhale into the lungs every 4 (four) hours.  . ALPRAZolam (XANAX) 0.25 MG tablet Take 1 tablet (0.25 mg total) by mouth 3 (three) times daily as needed for anxiety.  Marland Kitchen amLODipine (NORVASC) 5 MG tablet TAKE ONE TABLET BY MOUTH AT BEDTIME FOR HTN  . atorvastatin (LIPITOR) 10 MG tablet Take 1 tablet (10 mg total) by mouth daily. AM  . brimonidine-timolol (COMBIGAN) 0.2-0.5 % ophthalmic solution Place 1 drop into both eyes every 12 (twelve) hours.  . ciprofloxacin (CIPRO) 500 MG tablet Take 1 tablet (500 mg total) by mouth every other day.  . conjugated estrogens (PREMARIN) vaginal cream Place 1 Applicatorful vaginally 2 (two) times a week.  . Difluprednate (DUREZOL) 0.05 % EMUL Apply to eye.  Marland Kitchen FLUoxetine (PROZAC) 20 MG  capsule Take 1 capsule (20 mg total) by mouth daily. AM  . Fluticasone-Salmeterol (ADVAIR DISKUS) 250-50 MCG/DOSE AEPB Inhale 1 puff into the lungs 2 (two) times daily.  . hydrALAZINE (APRESOLINE) 25 MG tablet Take 1 tablet (25 mg total) by mouth daily.  Marland Kitchen KRILL OIL PO Take by mouth.  . losartan (COZAAR) 50 MG tablet Take 1 tablet (50 mg total) by mouth daily.  . montelukast (SINGULAIR) 10 MG tablet Take 1 tablet (10 mg total) by mouth daily.  Marland Kitchen moxifloxacin (VIGAMOX) 0.5 % ophthalmic solution 1 drop 3 (three) times daily.  . Multiple Vitamins-Minerals (PRESERVISION AREDS PO) Take by mouth. AM  . nepafenac (ILEVRO) 0.3 % ophthalmic suspension 1 drop daily.  . nitrofurantoin, macrocrystal-monohydrate, (MACROBID) 100 MG capsule Take 1 capsule (100 mg total) by mouth 2 (two) times daily.  . sotalol (BETAPACE) 80 MG tablet Take 1 tablet (80 mg total) by mouth daily.  . travoprost, benzalkonium, (TRAVATAN) 0.004 % ophthalmic solution 1 drop at bedtime.  . vitamin B-12 (CYANOCOBALAMIN) 500 MCG tablet Take 500 mcg by mouth daily.  Marland Kitchen zolpidem (AMBIEN) 10 MG tablet Take 1 tablet (10 mg total) by mouth at bedtime as needed for sleep.  . [DISCONTINUED] nitrofurantoin, macrocrystal-monohydrate, (MACROBID) 100 MG capsule Take 1 capsule (100 mg total) by mouth 2 (two) times daily.   No facility-administered encounter medications on file as of 07/15/2019.    Surgical History: Past Surgical History:  Procedure Laterality Date  . ABDOMINAL HYSTERECTOMY    .  BLADDER SURGERY    . BRAIN SURGERY    . CARDIAC CATHETERIZATION    . CATARACT EXTRACTION W/PHACO Left 09/15/2014   Procedure: CATARACT EXTRACTION PHACO AND INTRAOCULAR LENS PLACEMENT (IOC);  Surgeon: Ronnell Freshwater, MD;  Location: [facility];  Service: Ophthalmology;  Laterality: Left;  . CATARACT EXTRACTION W/PHACO Right 03/23/2015   Procedure: CATARACT EXTRACTION PHACO AND INTRAOCULAR LENS PLACEMENT (IOC);  Surgeon: Ronnell Freshwater, MD;  Location: [facility];  Service: Ophthalmology;  Laterality: Right;  . CHOLECYSTECTOMY    . CRANIOTOMY    . INSERT / REPLACE / REMOVE PACEMAKER    . JOINT REPLACEMENT     TOTAL HIP    Medical History: Past Medical History:  Diagnosis Date  . Arthritis    Hands, feet, jaw  . Asthma   . CHF (congestive heart failure) ([facility])   . Depression   . DVT (deep venous thrombosis) ([facility])   . Dysrhythmia    A-FIB  . Fracture of hip ([facility])    right  . Full dentures    UPPER AND LOWER  . Headache(784.0)   . Hypercholesterolemia   . Hypertension    CONTROLLED ON MEDS  . PE (pulmonary embolism)   . Presence of permanent cardiac pacemaker   . Stroke (cerebrum) ([facility])    DIFFICULT FOR HER TO COMPLETE HER SENTENCES,RESPONDS SLOWLY  . Stroke [facility]) 2006   CVA-CEREBRAL ANEURYSM  . UTI (urinary tract infection)    FREQUENT    Family History: Family History  Problem Relation Age of Onset  . Asthma Mother   . Hypertension Mother   . Heart disease Father     Social History   Socioeconomic History  . Marital status: Married    Spouse name: Not on file  . Number of children: Not on file  . Years of education: Not on file  . Highest education level: Not on file  Occupational History  . Not on file  Tobacco Use  . Smoking status: Never Smoker  . Smokeless tobacco: Never Used  Substance and Sexual Activity  . Alcohol use: No  . Drug use: Never  . Sexual activity: Never  Other Topics Concern  . Not on file  Social History Narrative  . Not on file   Social Determinants of Health   Financial Resource Strain:   . Difficulty of Paying Living Expenses: Not on file  Food Insecurity:   . Worried About Charity fundraiser in the Last Year: Not on file  . Ran Out of Food in the Last Year: Not on file  Transportation Needs:   . Lack of Transportation (Medical): Not on file  . Lack of Transportation (Non-Medical): Not on file  Physical Activity:   . Days of  Exercise per Week: Not on file  . Minutes of Exercise per Session: Not on file  Stress:   . Feeling of Stress : Not on file  Social Connections:   . Frequency of Communication with Friends and Family: Not on file  . Frequency of Social Gatherings with Friends and Family: Not on file  . Attends Religious Services: Not on file  . Active Member of Clubs or Organizations: Not on file  . Attends Archivist Meetings: Not on file  . Marital Status: Not on file  Intimate Partner Violence:   . Fear of Current or Ex-Partner: Not on file  . Emotionally Abused: Not on file  . Physically Abused: Not on file  . Sexually  Abused: Not on file      Review of Systems  Constitutional: Positive for activity change and fatigue. Negative for chills and unexpected weight change.       Has noted she is getting tired more easily.   HENT: Negative for congestion, postnasal drip, rhinorrhea, sneezing and sore throat.   Respiratory: Negative for cough, chest tightness, shortness of breath and wheezing.   Cardiovascular: Negative for chest pain and palpitations.       Blood pressure elevated today.   Gastrointestinal: Negative for abdominal pain, constipation, diarrhea, nausea and vomiting.  Endocrine: Negative for cold intolerance, heat intolerance, polydipsia and polyuria.  Genitourinary: Positive for dysuria, frequency and urgency.  Musculoskeletal: Positive for gait problem. Negative for arthralgias, back pain, joint swelling and neck pain.  Skin: Negative for rash.  Allergic/Immunologic: Negative for environmental allergies.  Neurological: Positive for weakness. Negative for dizziness, tremors, numbness and headaches.  Hematological: Negative for adenopathy. Does not bruise/bleed easily.  Psychiatric/Behavioral: Positive for dysphoric mood. Negative for behavioral problems (Depression), sleep disturbance and suicidal ideas. The patient is nervous/anxious.        Patient having increased  depression as she has lost three family members since Thanksgiving, 2020.    Today's Vitals   07/15/19 1454  BP: (!) 168/76  Pulse: 79  Resp: 16  Temp: (!) 97.3 F (36.3 C)  SpO2: 94%  Weight: 143 lb 6.4 oz (65 kg)  Height: 5\' 5"  (1.651 m)   Body mass index is 23.86 kg/m.  Physical Exam Vitals and nursing note reviewed.  Constitutional:      Appearance: Normal appearance. She is well-developed.  HENT:     Head: Normocephalic and atraumatic.     Nose: Nose normal.  Eyes:     Conjunctiva/sclera: Conjunctivae normal.     Pupils: Pupils are equal, round, and reactive to light.  Neck:     Thyroid: No thyromegaly.     Vascular: No carotid bruit or JVD.     Trachea: No tracheal deviation.  Cardiovascular:     Rate and Rhythm: Normal rate. Rhythm irregular.     Heart sounds: Murmur present. Systolic murmur present with a grade of 2/6.  Pulmonary:     Effort: Pulmonary effort is normal.     Breath sounds: Normal breath sounds. No wheezing.  Abdominal:     General: Bowel sounds are normal.     Palpations: Abdomen is soft.     Tenderness: There is no abdominal tenderness.  Genitourinary:    Comments: Urine sample positive for moderate WBC Musculoskeletal:        General: Normal range of motion.     Cervical back: Normal range of motion and neck supple.     Comments: She is using a rolling walker for assistance with ambulation.   Lymphadenopathy:     Cervical: No cervical adenopathy.  Skin:    General: Skin is warm and dry.     Capillary Refill: Capillary refill takes 2 to 3 seconds.  Neurological:     Mental Status: She is alert and oriented to person, place, and time. Mental status is at baseline.     Comments: She is at her neurological basseline.   Psychiatric:        Behavior: Behavior normal.        Thought Content: Thought content normal.        Judgment: Judgment normal.     Comments: Patient is tearful throughout the visit.     Assessment/Plan: 1. Urinary  tract infection with hematuria, site unspecified Start macrobid 100mg  twice dialy for 7 days. Patient to hold preventive cipro while taking macrobid. Will send urine for culture and sensitivity and adjust antibiotic therapy as indicated.  - nitrofurantoin, macrocrystal-monohydrate, (MACROBID) 100 MG capsule; Take 1 capsule (100 mg total) by mouth 2 (two) times daily.  Dispense: 14 capsule; Refill: 0  2. Essential hypertension Stable. Continue bp medication as prescribed. Regular visits with cardiology as scheduled   3. Dysuria - POCT Urinalysis Dipstick - CULTURE, URINE COMPREHENSIVE  4. GAD (generalized anxiety disorder) Continue alprazolam 0.25mg  as needed and as prescribed for acute anxiety.   5. Complicated grief Refer for counseling services for grief counseling.  - Ambulatory referral to Psychology  General Counseling: Gaylyn Rong understanding of the findings of todays visit and agrees with plan of treatment. I have discussed any further diagnostic evaluation that may be needed or ordered today. We also reviewed her medications today. she has been encouraged to call the office with any questions or concerns that should arise related to todays visit.  This patient was seen by Leretha Pol FNP Collaboration with Dr [name] as a part of collaborative care agreement  Orders Placed This Encounter  Procedures  . CULTURE, URINE COMPREHENSIVE  . Ambulatory referral to Psychology  . POCT Urinalysis Dipstick    Meds ordered this encounter  Medications  . nitrofurantoin, macrocrystal-monohydrate, (MACROBID) 100 MG capsule    Sig: Take 1 capsule (100 mg total) by mouth 2 (two) times daily.    Dispense:  14 capsule    Refill:  0    Patient to hold cipro every other day while taking macrobid.    Order Specific Question:   Supervising Provider    Answer:   [name] [id number]    Total time spent: 30 Minutes   Time spent includes review of chart, medications, test  results, and follow up plan with the patient.      Dr [name] Internal medicine

## 2019-07-17 DIAGNOSIS — F4321 Adjustment disorder with depressed mood: Secondary | ICD-10-CM | POA: Insufficient documentation

## 2019-07-17 DIAGNOSIS — F4329 Adjustment disorder with other symptoms: Secondary | ICD-10-CM | POA: Insufficient documentation

## 2019-07-17 NOTE — Progress Notes (Signed)
Started on macrobid at time of visit

## 2019-07-19 LAB — CULTURE, URINE COMPREHENSIVE

## 2019-07-22 DIAGNOSIS — H353211 Exudative age-related macular degeneration, right eye, with active choroidal neovascularization: Secondary | ICD-10-CM | POA: Diagnosis not present

## 2019-08-26 DIAGNOSIS — H353211 Exudative age-related macular degeneration, right eye, with active choroidal neovascularization: Secondary | ICD-10-CM | POA: Diagnosis not present

## 2019-09-25 DIAGNOSIS — H353211 Exudative age-related macular degeneration, right eye, with active choroidal neovascularization: Secondary | ICD-10-CM | POA: Diagnosis not present

## 2019-10-09 ENCOUNTER — Telehealth: Payer: Self-pay

## 2019-10-10 ENCOUNTER — Other Ambulatory Visit: Payer: Self-pay | Admitting: Nurse Practitioner

## 2019-10-10 DIAGNOSIS — F409 Phobic anxiety disorder, unspecified: Secondary | ICD-10-CM

## 2019-10-10 MED ORDER — ZOLPIDEM TARTRATE 10 MG PO TABS: 10.0000 mg | ORAL_TABLET | Freq: Every evening | ORAL | 3 refills | Status: DC | PRN

## 2019-10-10 NOTE — Progress Notes (Signed)
Renewed ambien and sent to [facility] [place]

## 2019-10-10 NOTE — Telephone Encounter (Signed)
Renewed ambien and sent to [facility] [place]

## 2019-10-16 ENCOUNTER — Telehealth: Payer: Self-pay

## 2019-10-16 NOTE — Telephone Encounter (Signed)
Lmom to confirm and screen for 10-18-19 ov.

## 2019-10-18 ENCOUNTER — Ambulatory Visit: Payer: Medicare Other | Admitting: Nurse Practitioner

## 2019-12-12 ENCOUNTER — Other Ambulatory Visit: Payer: Self-pay

## 2019-12-12 DIAGNOSIS — F5105 Insomnia due to other mental disorder: Secondary | ICD-10-CM

## 2019-12-13 ENCOUNTER — Telehealth: Payer: Self-pay

## 2019-12-13 DIAGNOSIS — F409 Phobic anxiety disorder, unspecified: Secondary | ICD-10-CM

## 2019-12-13 MED ORDER — ALPRAZOLAM 0.25 MG PO TABS: 0.2500 mg | ORAL_TABLET | Freq: Three times a day (TID) | ORAL | 2 refills | Status: DC | PRN

## 2019-12-13 NOTE — Telephone Encounter (Signed)
I am going to give you written prescription to fax to this pharmacy. They will not accept electronically signed prescriptions .

## 2019-12-13 NOTE — Telephone Encounter (Signed)
Faxed  [facility] pres for alprazolam

## 2019-12-16 ENCOUNTER — Other Ambulatory Visit: Payer: Self-pay

## 2019-12-16 DIAGNOSIS — F409 Phobic anxiety disorder, unspecified: Secondary | ICD-10-CM

## 2019-12-16 MED ORDER — ALPRAZOLAM 0.25 MG PO TABS: 0.2500 mg | ORAL_TABLET | Freq: Three times a day (TID) | ORAL | 2 refills | Status: DC | PRN

## 2019-12-16 NOTE — Telephone Encounter (Signed)
Spoke with champva with [facility] and cancelled xanax pres and [facility]  For 30 days with no refills as per dfk

## 2019-12-23 DIAGNOSIS — H353211 Exudative age-related macular degeneration, right eye, with active choroidal neovascularization: Secondary | ICD-10-CM | POA: Diagnosis not present

## 2020-01-07 ENCOUNTER — Telehealth: Payer: Self-pay

## 2020-01-07 NOTE — Telephone Encounter (Signed)
Confirmed and screened for 01-07-20 ov.

## 2020-01-09 ENCOUNTER — Ambulatory Visit: Payer: Medicare Other | Admitting: Nurse Practitioner

## 2020-02-03 ENCOUNTER — Other Ambulatory Visit: Payer: Self-pay

## 2020-02-03 DIAGNOSIS — F409 Phobic anxiety disorder, unspecified: Secondary | ICD-10-CM

## 2020-02-03 MED ORDER — ALPRAZOLAM 0.25 MG PO TABS: 0.2500 mg | ORAL_TABLET | Freq: Three times a day (TID) | ORAL | 0 refills | Status: DC | PRN

## 2020-02-06 ENCOUNTER — Ambulatory Visit: Payer: Medicare Other | Admitting: Internal Medicine

## 2020-02-11 ENCOUNTER — Other Ambulatory Visit: Payer: Self-pay | Admitting: Internal Medicine

## 2020-02-11 ENCOUNTER — Other Ambulatory Visit: Payer: Self-pay

## 2020-02-11 ENCOUNTER — Ambulatory Visit (INDEPENDENT_AMBULATORY_CARE_PROVIDER_SITE_OTHER): Payer: Medicare Other | Admitting: Nurse Practitioner

## 2020-02-11 ENCOUNTER — Encounter: Payer: Self-pay | Admitting: Internal Medicine

## 2020-02-11 VITALS — BP 140/70 | HR 91 | Temp 97.4°F | Resp 16 | Ht 65.0 in | Wt 144.6 lb

## 2020-02-11 DIAGNOSIS — F411 Generalized anxiety disorder: Secondary | ICD-10-CM

## 2020-02-11 DIAGNOSIS — E782 Mixed hyperlipidemia: Secondary | ICD-10-CM

## 2020-02-11 DIAGNOSIS — F409 Phobic anxiety disorder, unspecified: Secondary | ICD-10-CM

## 2020-02-11 DIAGNOSIS — Z0001 Encounter for general adult medical examination with abnormal findings: Secondary | ICD-10-CM

## 2020-02-11 DIAGNOSIS — R300 Dysuria: Secondary | ICD-10-CM

## 2020-02-11 DIAGNOSIS — F4329 Adjustment disorder with other symptoms: Secondary | ICD-10-CM | POA: Diagnosis not present

## 2020-02-11 DIAGNOSIS — Z79899 Other long term (current) drug therapy: Secondary | ICD-10-CM

## 2020-02-11 DIAGNOSIS — F5105 Insomnia due to other mental disorder: Secondary | ICD-10-CM

## 2020-02-11 DIAGNOSIS — F4321 Adjustment disorder with depressed mood: Secondary | ICD-10-CM

## 2020-02-11 DIAGNOSIS — I10 Essential (primary) hypertension: Secondary | ICD-10-CM

## 2020-02-11 LAB — POCT URINE DRUG SCREEN
POC Amphetamine UR: NOT DETECTED
POC BENZODIAZEPINES UR: POSITIVE — AB
POC Barbiturate UR: NOT DETECTED
POC Cocaine UR: NOT DETECTED
POC Ecstasy UR: NOT DETECTED
POC Marijuana UR: NOT DETECTED
POC Methadone UR: NOT DETECTED
POC Methamphetamine UR: NOT DETECTED
POC Opiate Ur: NOT DETECTED
POC Oxycodone UR: NOT DETECTED
POC PHENCYCLIDINE UR: NOT DETECTED
POC TRICYCLICS UR: NOT DETECTED

## 2020-02-11 MED ORDER — ALPRAZOLAM 0.25 MG PO TABS: ORAL_TABLET | ORAL | 0 refills | Status: DC

## 2020-02-11 MED ORDER — ZOLPIDEM TARTRATE 5 MG PO TABS: 10.0000 mg | ORAL_TABLET | Freq: Every evening | ORAL | 0 refills | Status: DC | PRN

## 2020-02-11 MED ORDER — FLUOXETINE HCL 40 MG PO CAPS: 40.0000 mg | ORAL_CAPSULE | Freq: Every day | ORAL | 1 refills | Status: DC

## 2020-02-11 NOTE — Progress Notes (Signed)
[facility] [place], [place] [address]  Internal MEDICINE  Office Visit Note  Patient Name: Michele Summers  [id number]  [id number]  Date of Service: 03/01/2020   Pt is here for routine health maintenance examination  Chief Complaint  Patient presents with  . Medicare Wellness  . Quality Metric Gaps    Tdap,Dexa scan, flu vaccine, covid vaccine     The patient is here for health maintenance exam. She is c/o urinary frequency. This is especially bad at night. Gets up several times during the night to urinate. She states that she does have increased depression since her son passed away. She had lost two other family members in a short period of time.  She cries frequently and has had some worsening trouble with sleep. She has a pacemaker. Does admit to increased fatigue.     Current Medication: Outpatient Encounter Medications as of 02/11/2020  Medication Sig  . albuterol (VENTOLIN HFA) 108 (90 Base) MCG/ACT inhaler Inhale 2 puffs into the lungs every 6 (six) hours as needed. For shortness of breath  . albuterol-ipratropium (COMBIVENT) 18-103 MCG/ACT inhaler Inhale into the lungs every 4 (four) hours.  Marland Kitchen atorvastatin (LIPITOR) 10 MG tablet Take 1 tablet (10 mg total) by mouth daily. AM  . brimonidine-timolol (COMBIGAN) 0.2-0.5 % ophthalmic solution Place 1 drop into both eyes every 12 (twelve) hours.  . conjugated estrogens (PREMARIN) vaginal cream Place 1 Applicatorful vaginally 2 (two) times a week.  . Difluprednate (DUREZOL) 0.05 % EMUL Apply to eye.  Marland Kitchen FLUoxetine (PROZAC) 40 MG capsule Take 1 capsule (40 mg total) by mouth daily. AM  . Fluticasone-Salmeterol (ADVAIR DISKUS) 250-50 MCG/DOSE AEPB Inhale 1 puff into the lungs 2 (two) times daily.  . hydrALAZINE (APRESOLINE) 25 MG tablet Take 1 tablet (25 mg total) by mouth daily.  Marland Kitchen KRILL OIL PO Take by mouth.  . losartan (COZAAR) 50 MG tablet Take 1 tablet (50 mg total) by mouth daily.  . montelukast  (SINGULAIR) 10 MG tablet Take 1 tablet (10 mg total) by mouth daily.  Marland Kitchen moxifloxacin (VIGAMOX) 0.5 % ophthalmic solution 1 drop 3 (three) times daily.  . Multiple Vitamins-Minerals (PRESERVISION AREDS PO) Take by mouth. AM  . nepafenac (ILEVRO) 0.3 % ophthalmic suspension 1 drop daily.  . sotalol (BETAPACE) 80 MG tablet Take 1 tablet (80 mg total) by mouth daily.  . travoprost, benzalkonium, (TRAVATAN) 0.004 % ophthalmic solution 1 drop at bedtime.  . vitamin B-12 (CYANOCOBALAMIN) 500 MCG tablet Take 500 mcg by mouth daily.  . [DISCONTINUED] ALPRAZolam (XANAX) 0.25 MG tablet Take 1 tablet (0.25 mg total) by mouth 3 (three) times daily as needed for up to 8 days for anxiety.  . [DISCONTINUED] amLODipine (NORVASC) 5 MG tablet TAKE ONE TABLET BY MOUTH AT BEDTIME FOR HTN  . [DISCONTINUED] FLUoxetine (PROZAC) 20 MG capsule Take 1 capsule (20 mg total) by mouth daily. AM  . [DISCONTINUED] FLUoxetine (PROZAC) 40 MG capsule Take 1 capsule (40 mg total) by mouth daily. AM  . [DISCONTINUED] zolpidem (AMBIEN) 10 MG tablet Take 1 tablet (10 mg total) by mouth at bedtime as needed for sleep.  . [DISCONTINUED] ciprofloxacin (CIPRO) 500 MG tablet Take 1 tablet (500 mg total) by mouth every other day. (Patient not taking: Reported on 02/11/2020)  . [DISCONTINUED] nitrofurantoin, macrocrystal-monohydrate, (MACROBID) 100 MG capsule Take 1 capsule (100 mg total) by mouth 2 (two) times daily. (Patient not taking: Reported on 02/11/2020)   No facility-administered encounter medications on file as of  02/11/2020.    Surgical History: Past Surgical History:  Procedure Laterality Date  . ABDOMINAL HYSTERECTOMY    . BLADDER SURGERY    . BRAIN SURGERY    . CARDIAC CATHETERIZATION    . CATARACT EXTRACTION W/PHACO Left 09/15/2014   Procedure: CATARACT EXTRACTION PHACO AND INTRAOCULAR LENS PLACEMENT (IOC);  Surgeon: Ronnell Freshwater, MD;  Location: [place];  Service: Ophthalmology;  Laterality: Left;  .  CATARACT EXTRACTION W/PHACO Right 03/23/2015   Procedure: CATARACT EXTRACTION PHACO AND INTRAOCULAR LENS PLACEMENT (IOC);  Surgeon: Ronnell Freshwater, MD;  Location: [place];  Service: Ophthalmology;  Laterality: Right;  . CHOLECYSTECTOMY    . CRANIOTOMY    . INSERT / REPLACE / REMOVE PACEMAKER    . JOINT REPLACEMENT     TOTAL HIP    Medical History: Past Medical History:  Diagnosis Date  . Arthritis    Hands, feet, jaw  . Asthma   . CHF (congestive heart failure) ([place])   . Depression   . DVT (deep venous thrombosis) ([place])   . Dysrhythmia    A-FIB  . Fracture of hip ([place])    right  . Full dentures    UPPER AND LOWER  . Headache(784.0)   . Hypercholesterolemia   . Hypertension    CONTROLLED ON MEDS  . PE (pulmonary embolism)   . Presence of permanent cardiac pacemaker   . Stroke (cerebrum) ([facility])    DIFFICULT FOR HER TO COMPLETE HER SENTENCES,RESPONDS SLOWLY  . Stroke [facility]) 2006   CVA-CEREBRAL ANEURYSM  . UTI (urinary tract infection)    FREQUENT  . Wears dentures    full upper and lower    Family History: Family History  Problem Relation Age of Onset  . Asthma Mother   . Hypertension Mother   . Heart disease Father       Review of Systems  Constitutional: Positive for fatigue. Negative for activity change, chills and unexpected weight change.       Has noted she is getting tired more easily.   HENT: Negative for congestion, postnasal drip, rhinorrhea, sneezing and sore throat.   Respiratory: Negative for cough, chest tightness, shortness of breath and wheezing.   Cardiovascular: Negative for chest pain and palpitations.       Patient has pacemaker.   Gastrointestinal: Negative for abdominal pain, constipation, diarrhea, nausea and vomiting.  Endocrine: Negative for cold intolerance, heat intolerance, polydipsia and polyuria.  Genitourinary: Negative for dysuria, frequency and urgency.  Musculoskeletal: Positive for gait problem. Negative  for arthralgias, back pain, joint swelling and neck pain.  Skin: Negative for rash.  Allergic/Immunologic: Negative for environmental allergies.  Neurological: Positive for speech difficulty and weakness. Negative for dizziness, tremors, numbness and headaches.  Hematological: Negative for adenopathy. Does not bruise/bleed easily.  Psychiatric/Behavioral: Positive for dysphoric mood. Negative for behavioral problems (Depression), sleep disturbance and suicidal ideas. The patient is nervous/anxious.        Patient having increased depression as she has lost three family members since Thanksgiving, 2020. Has also recently lost her son.      Today's Vitals   02/11/20 1105 02/11/20 1119  BP: (!) 158/78 140/70  Pulse: 91   Resp: 16   Temp: (!) 97.4 F (36.3 C)   SpO2: 98%   Weight: 144 lb 9.6 oz (65.6 kg)   Height: 5\' 5"  (1.651 m)    Body mass index is 24.06 kg/m.  Physical Exam Vitals and nursing note reviewed.  Constitutional:  Appearance: Normal appearance. She is well-developed.  HENT:     Head: Normocephalic and atraumatic.     Nose: Nose normal.  Eyes:     Conjunctiva/sclera: Conjunctivae normal.     Pupils: Pupils are equal, round, and reactive to light.  Neck:     Thyroid: No thyromegaly.     Vascular: No carotid bruit or JVD.     Trachea: No tracheal deviation.  Cardiovascular:     Rate and Rhythm: Normal rate. Rhythm irregular.     Heart sounds: Murmur heard.  Systolic murmur is present with a grade of 2/6.   Pulmonary:     Effort: Pulmonary effort is normal.     Breath sounds: Normal breath sounds. No wheezing.  Abdominal:     General: Bowel sounds are normal.     Palpations: Abdomen is soft.     Tenderness: There is no abdominal tenderness.  Genitourinary:    Comments: Urine sample positive for moderate WBC Musculoskeletal:        General: Normal range of motion.     Cervical back: Normal range of motion and neck supple.     Comments: She is using a  rolling walker for assistance with ambulation.   Lymphadenopathy:     Cervical: No cervical adenopathy.  Skin:    General: Skin is warm and dry.     Capillary Refill: Capillary refill takes 2 to 3 seconds.  Neurological:     Mental Status: She is alert and oriented to person, place, and time. Mental status is at baseline.     Comments: She is at her neurological [place].   Psychiatric:        Behavior: Behavior normal.        Thought Content: Thought content normal.        Judgment: Judgment normal.     Comments: Patient is tearful throughout the visit.     Depression screen [facility] 2/9 02/11/2020 07/15/2019 04/05/2019 01/03/2019 07/13/2018  Decreased Interest 2 0 - 1 0  Down, Depressed, Hopeless 2 1 1 1  0  PHQ - 2 Score 4 1 1 2  0  Altered sleeping - - - 3 -  Tired, decreased energy - - - 1 -  Change in appetite - - - 0 -  Feeling bad or failure about yourself  - - - 0 -  Trouble concentrating - - - 0 -  Moving slowly or fidgety/restless - - - 1 -  Suicidal thoughts - - - 0 -  PHQ-9 Score - - - 7 -  Difficult doing work/chores - - - Somewhat difficult -    Functional Status Survey: Is the patient deaf or have difficulty hearing?: No Does the patient have difficulty seeing, even when wearing glasses/contacts?: Yes Does the patient have difficulty concentrating, remembering, or making decisions?: No Does the patient have difficulty walking or climbing stairs?: Yes Does the patient have difficulty dressing or bathing?: No Does the patient have difficulty doing errands alone such as visiting a doctor's office or shopping?: Yes  MMSE - [place] Exam 01/03/2019 12/22/2017  Orientation to time 5 5  Orientation to Place 5 5  Registration 3 3  Attention/ Calculation 5 5  Recall 3 3  Language- name 2 objects 2 2  Language- repeat 1 1  Language- follow 3 step command 3 3  Language- read & follow direction 1 0  Write a sentence 1 0  Copy design 1 1  Total score 30 28  Fall  Risk  02/11/2020 07/15/2019 04/05/2019 01/03/2019 07/13/2018  Falls in the past year? 0 1 1 1 1   Comment - - - - -  Number falls in past yr: - 1 1 0 0  Injury with Fall? - 0 0 0 0     LABS: Recent Results (from the past 2160 hour(s))  UA/M w/rflx Culture, Routine     Status: None   Collection Time: 02/11/20 11:24 AM   Specimen: Urine   Urine  Result Value Ref Range   Specific Gravity, UA 1.014 1.005 - 1.030   pH, UA 5.5 5.0 - 7.5   Color, UA Yellow Yellow   Appearance Ur Clear Clear   Leukocytes,UA Negative Negative   Protein,UA Trace Negative/Trace   Glucose, UA Negative Negative   Ketones, UA Negative Negative   RBC, UA Negative Negative   Bilirubin, UA Negative Negative   Urobilinogen, Ur 0.2 0.2 - 1.0 mg/dL   Nitrite, UA Negative Negative   Microscopic Examination Comment     Comment: Microscopic follows if indicated.   Microscopic Examination See below:     Comment: Microscopic was indicated and was performed.   Urinalysis Reflex Comment     Comment: This specimen will not reflex to a Urine Culture.  Microscopic Examination     Status: None   Collection Time: 02/11/20 11:24 AM   Urine  Result Value Ref Range   WBC, UA 0-5 0 - 5 /hpf   RBC None seen 0 - 2 /hpf   Epithelial Cells (non renal) 0-10 0 - 10 /hpf   Casts None seen None seen /lpf   Bacteria, UA None seen None seen/Few  POCT Urine Drug Screen     Status: Abnormal   Collection Time: 02/11/20 11:26 AM  Result Value Ref Range   POC Methamphetamine UR None Detected None Detected   POC Opiate Ur None Detected None Detected   POC Barbiturate UR None Detected None Detected   POC Amphetamine UR None Detected None Detected   POC Oxycodone UR None Detected None Detected   POC Cocaine UR None Detected None Detected   POC Ecstasy UR None Detected None Detected   POC TRICYCLICS UR None Detected None Detected   POC PHENCYCLIDINE UR None Detected None Detected   POC Marijuana UR None Detected None Detected   POC  Methadone UR None Detected None Detected   POC BENZODIAZEPINES UR Positive (A) None Detected   URINE TEMPERATURE     POC DRUG SCREEN OXIDANTS URINE     POC SPECIFIC GRAVITY URINE     POC PH URINE     Methylenedioxyamphetamine    SARS CORONAVIRUS 2 (TAT 6-24 HRS) Nasopharyngeal Nasopharyngeal Swab     Status: None   Collection Time: 02/26/20  1:34 PM   Specimen: Nasopharyngeal Swab  Result Value Ref Range   SARS Coronavirus 2 NEGATIVE NEGATIVE    Comment: (NOTE) SARS-CoV-2 target nucleic acids are NOT DETECTED.  The SARS-CoV-2 RNA is generally detectable in upper and lower respiratory specimens during the acute phase of infection. Negative results do not preclude SARS-CoV-2 infection, do not rule out co-infections with other pathogens, and should not be used as the sole basis for treatment or other patient management decisions. Negative results must be combined with clinical observations, patient history, and epidemiological information. The expected result is Negative.  Fact Sheet for Patients: SugarRoll.be  Fact Sheet for Healthcare Providers: https://www.woods-mathews.com/  This test is not yet approved or cleared by the [place]  FDA and  has been authorized for detection and/or diagnosis of SARS-CoV-2 by FDA under an Emergency Use Authorization (EUA). This EUA will remain  in effect (meaning this test can be used) for the duration of the COVID-19 declaration under Se ction 564(b)(1) of the Act, 21 U.S.C. section 360bbb-3(b)(1), unless the authorization is terminated or revoked sooner.  Performed at [facility], [place] [address], [place], [place] [address]     Assessment/Plan: 1. Encounter for general adult medical examination with abnormal findings Annual health maintenance exam today.   2. Essential hypertension Stable. Continue bp medication as prescribed   3. Mixed hyperlipidemia Continue atorvastatin as  prescribed   4. Complicated grief Consider increasing fluoxetine to 40mg  daily. Recommend grief counseling   5. GAD (generalized anxiety disorder) Increased dose fluoxetin eto 40mg  daily. May take alprazolam 0.25mg  up to daily as needed for acute anxiety.  - FLUoxetine (PROZAC) 40 MG capsule; Take 1 capsule (40 mg total) by mouth daily. AM  Dispense: 90 capsule; Refill: 1  6. Insomnia due to anxiety and fear May continue to take zolpidem 5mg , gradually weaning from 10mg  to 5mg  at bedtime as needed for insomnia.  7. Encounter for long-term (current) use of medications - POCT Urine Drug Screen appropriately positive for [facility] only.   8. Dysuria - UA/M w/rflx Culture, Routine  General Counseling: Michele Summers verbalizes understanding of the findings of todays visit and agrees with plan of treatment. I have discussed any further diagnostic evaluation that may be needed or ordered today. We also reviewed her medications today. she has been encouraged to call the office with any questions or concerns that should arise related to todays visit.    Counseling:  This patient was seen by Leretha Pol FNP Collaboration with Dr [name] as a part of collaborative care agreement  Orders Placed This Encounter  Procedures  . Microscopic Examination  . UA/M w/rflx Culture, Routine  . POCT Urine Drug Screen    Meds ordered this encounter  Medications  . DISCONTD: FLUoxetine (PROZAC) 40 MG capsule    Sig: Take 1 capsule (40 mg total) by mouth daily. AM    Dispense:  90 capsule    Refill:  1    Increased dose    Order Specific Question:   Supervising Provider    Answer:   [name] [[id number]]  . FLUoxetine (PROZAC) 40 MG capsule    Sig: Take 1 capsule (40 mg total) by mouth daily. AM    Dispense:  90 capsule    Refill:  1    Increased dose    Order Specific Question:   Supervising Provider    Answer:   [name] [[id number]]    Total time spent: 80 Minutes  Time spent includes review of  chart, medications, test results, and follow up plan with the patient.     [name], MD  Internal Medicine

## 2020-02-12 LAB — MICROSCOPIC EXAMINATION
Bacteria, UA: NONE SEEN
Casts: NONE SEEN /lpf
RBC, Urine: NONE SEEN /hpf (ref 0–2)

## 2020-02-12 LAB — UA/M W/RFLX CULTURE, ROUTINE
Bilirubin, UA: NEGATIVE
Glucose, UA: NEGATIVE
Ketones, UA: NEGATIVE
Leukocytes,UA: NEGATIVE
Nitrite, UA: NEGATIVE
RBC, UA: NEGATIVE
Specific Gravity, UA: 1.014 (ref 1.005–1.030)
Urobilinogen, Ur: 0.2 mg/dL (ref 0.2–1.0)
pH, UA: 5.5 (ref 5.0–7.5)

## 2020-02-13 ENCOUNTER — Telehealth: Payer: Self-pay

## 2020-02-13 ENCOUNTER — Other Ambulatory Visit: Payer: Self-pay | Admitting: Nurse Practitioner

## 2020-02-13 DIAGNOSIS — H353211 Exudative age-related macular degeneration, right eye, with active choroidal neovascularization: Secondary | ICD-10-CM | POA: Diagnosis not present

## 2020-02-13 DIAGNOSIS — F409 Phobic anxiety disorder, unspecified: Secondary | ICD-10-CM

## 2020-02-13 MED ORDER — ZOLPIDEM TARTRATE 5 MG PO TABS: 10.0000 mg | ORAL_TABLET | Freq: Every evening | ORAL | 0 refills | Status: DC | PRN

## 2020-02-13 MED ORDER — ALPRAZOLAM 0.25 MG PO TABS: ORAL_TABLET | ORAL | 0 refills | Status: DC

## 2020-02-13 NOTE — Telephone Encounter (Signed)
Pt daughter advised we send resend xanax and zolpidem and she is going call us back which bp med she need

## 2020-02-14 ENCOUNTER — Other Ambulatory Visit: Payer: Self-pay

## 2020-02-14 DIAGNOSIS — I10 Essential (primary) hypertension: Secondary | ICD-10-CM

## 2020-02-14 MED ORDER — AMLODIPINE BESYLATE 5 MG PO TABS: ORAL_TABLET | ORAL | 3 refills | Status: DC

## 2020-02-20 DIAGNOSIS — H02135 Senile ectropion of left lower eyelid: Secondary | ICD-10-CM | POA: Diagnosis not present

## 2020-02-20 DIAGNOSIS — H02132 Senile ectropion of right lower eyelid: Secondary | ICD-10-CM | POA: Diagnosis not present

## 2020-02-21 DIAGNOSIS — I509 Heart failure, unspecified: Secondary | ICD-10-CM | POA: Diagnosis not present

## 2020-02-24 ENCOUNTER — Other Ambulatory Visit: Payer: Self-pay

## 2020-02-24 ENCOUNTER — Encounter: Payer: Self-pay | Admitting: Ophthalmology

## 2020-02-24 ENCOUNTER — Ambulatory Visit: Payer: Medicare Other | Admitting: Nurse Practitioner

## 2020-02-26 ENCOUNTER — Other Ambulatory Visit
Admission: RE | Admit: 2020-02-26 | Discharge: 2020-02-26 | Disposition: A | Discharge status: Home / Self Care | Payer: Medicare Other | Source: Ambulatory Visit | Attending: Ophthalmology | Admitting: Ophthalmology

## 2020-02-26 ENCOUNTER — Other Ambulatory Visit: Payer: Self-pay

## 2020-02-26 DIAGNOSIS — Z20822 Contact with and (suspected) exposure to covid-19: Secondary | ICD-10-CM | POA: Diagnosis not present

## 2020-02-26 DIAGNOSIS — Z01812 Encounter for preprocedural laboratory examination: Secondary | ICD-10-CM | POA: Diagnosis not present

## 2020-02-26 NOTE — Discharge Instructions (Signed)
INSTRUCTIONS FOLLOWING OCULOPLASTIC SURGERY AMY M. FOWLER, MD  AFTER YOUR EYE SURGERY, THER ARE MANY THINGS WHICH YOU, THE PATIENT, CAN DO TO ASSURE THE BEST POSSIBLE RESULT FROM YOUR OPERATION.  THIS SHEET SHOULD BE REFERRED TO WHENEVER QUESTIONS ARISE.  IF THERE ARE ANY QUESTIONS NOT ANSWERED HERE, DO NOT HESITATE TO CALL OUR OFFICE AT [phone] OR [phone].  THERE IS ALWAYS SOMEONE AVAILABLE TO CALL IF QUESTIONS OR PROBLEMS ARISE.  VISION: Your vision may be blurred and out of focus after surgery until you are able to stop using your ointment, swelling resolves and your eye(s) heal. This may take 1 to 2 weeks at the least.  If your vision becomes gradually more dim or dark, this is not normal and you need to call our office immediately.  EYE CARE: For the first 48 hours after surgery, use ice packs frequently - "20 minutes on, 20 minutes off" - to help reduce swelling and bruising.  Small bags of frozen peas or corn make good ice packs along with cloths soaked in ice water.  If you are wearing a patch or other type of dressing following surgery, keep this on for the amount of time specified by your doctor.  For the first week following surgery, you will need to treat your stitches with great care.  It is OK to shower, but take care to not allow soapy water to run into your eye(s) to help reduce chances of infection.  You may gently clean the eyelashes and around the eye(s) with cotton balls and sterile water, BUT DO NOT RUB THE STITCHES VIGOROUSLY.  Keeping your stitches moist with ointment will help promote healing with minimal scar formation.  ACTIVITY: When you leave the surgery center, you should go home, rest and be inactive.  The eye(s) may feel scratchy and keeping the eyes closed will allow for faster healing.  The first week following surgery, avoid straining (anything making the face turn red) or lifting over 20 pounds.  Additionally, avoid bending which causes your head to go below  your waist.  Using your eyes will NOT harm them, so feel free to read, watch television, use the computer, etc as desired.  Driving depends on each individual, so check with your doctor if you have questions about driving. Do not wear contact lenses for about 2 weeks.  Do not wear eye makeup for 2 weeks.  Avoid swimming, hot tubs, gardening, and dusting for 1 to 2 weeks to reduce the risk of an infection.  MEDICATIONS:  You will be given a prescription for an ointment to use 4 times a day on your stitches.  You can use the ointment in your eyes if they feel scratchy or irritated.  If you eyelid(s) don't close completely when you sleep, put some ointment in your eyes before bedtime.  EMERGENCY: If you experience SEVERE EYE PAIN OR HEADACHE UNRELIEVED BY TYLENOL OR TRAMADOL, NAUSEA OR VOMITING, WORSENING REDNESS, OR WORSENING VISION (ESPECIALLY VISION THAT WAS INITIALLY BETTER) CALL [phone] OR [phone] DURING BUSINESS HOURS OR AFTER HOURS.  General Anesthesia, Adult, Care After This sheet gives you information about how to care for yourself after your procedure. Your health care provider may also give you more specific instructions. If you have problems or questions, contact your health care provider. What can I expect after the procedure? After the procedure, the following side effects are common:  Pain or discomfort at the IV site.  Nausea.  Vomiting.  Sore throat.  Trouble concentrating.    Feeling cold or chills.  Weak or tired.  Sleepiness and fatigue.  Soreness and body aches. These side effects can affect parts of the body that were not involved in surgery. Follow these instructions at home:  For at least 24 hours after the procedure:  Have a responsible adult stay with you. It is important to have someone help care for you until you are awake and alert.  Rest as needed.  Do not: ? Participate in activities in which you could fall or become injured. ? Drive. ? Use  heavy machinery. ? Drink alcohol. ? Take sleeping pills or medicines that cause drowsiness. ? Make important decisions or sign legal documents. ? Take care of children on your own. Eating and drinking  Follow any instructions from your health care provider about eating or drinking restrictions.  When you feel hungry, start by eating small amounts of foods that are soft and easy to digest (bland), such as toast. Gradually return to your regular diet.  Drink enough fluid to keep your urine pale yellow.  If you vomit, rehydrate by drinking water, juice, or clear broth. General instructions  If you have sleep apnea, surgery and certain medicines can increase your risk for breathing problems. Follow instructions from your health care provider about wearing your sleep device: ? Anytime you are sleeping, including during daytime naps. ? While taking prescription pain medicines, sleeping medicines, or medicines that make you drowsy.  Return to your normal activities as told by your health care provider. Ask your health care provider what activities are safe for you.  Take over-the-counter and prescription medicines only as told by your health care provider.  If you smoke, do not smoke without supervision.  Keep all follow-up visits as told by your health care provider. This is important. Contact a health care provider if:  You have nausea or vomiting that does not get better with medicine.  You cannot eat or drink without vomiting.  You have pain that does not get better with medicine.  You are unable to pass urine.  You develop a skin rash.  You have a fever.  You have redness around your IV site that gets worse. Get help right away if:  You have difficulty breathing.  You have chest pain.  You have blood in your urine or stool, or you vomit blood. Summary  After the procedure, it is common to have a sore throat or nausea. It is also common to feel tired.  Have a  responsible adult stay with you for the first 24 hours after general anesthesia. It is important to have someone help care for you until you are awake and alert.  When you feel hungry, start by eating small amounts of foods that are soft and easy to digest (bland), such as toast. Gradually return to your regular diet.  Drink enough fluid to keep your urine pale yellow.  Return to your normal activities as told by your health care provider. Ask your health care provider what activities are safe for you. This information is not intended to replace advice given to you by your health care provider. Make sure you discuss any questions you have with your health care provider. Document Revised: 05/05/2017 Document Reviewed: 12/16/2016 Elsevier Patient Education  2020 [employer]  

## 2020-02-27 LAB — SARS CORONAVIRUS 2 (TAT 6-24 HRS): SARS Coronavirus 2: NEGATIVE

## 2020-02-28 ENCOUNTER — Ambulatory Visit: Payer: Medicare Other | Admitting: Anesthesiology

## 2020-02-28 ENCOUNTER — Ambulatory Visit
Admission: RE | Admit: 2020-02-28 | Discharge: 2020-02-28 | Disposition: A | Discharge status: Home / Self Care | Payer: Medicare Other | Attending: Ophthalmology | Admitting: Ophthalmology

## 2020-02-28 ENCOUNTER — Encounter: Payer: Self-pay | Admitting: Ophthalmology

## 2020-02-28 ENCOUNTER — Encounter
Admission: RE | Disposition: A | Discharge status: Home / Self Care | Payer: Self-pay | Source: Home / Self Care | Attending: Ophthalmology

## 2020-02-28 ENCOUNTER — Other Ambulatory Visit: Payer: Self-pay

## 2020-02-28 DIAGNOSIS — H02105 Unspecified ectropion of left lower eyelid: Secondary | ICD-10-CM | POA: Insufficient documentation

## 2020-02-28 DIAGNOSIS — Z79899 Other long term (current) drug therapy: Secondary | ICD-10-CM | POA: Insufficient documentation

## 2020-02-28 DIAGNOSIS — H02102 Unspecified ectropion of right lower eyelid: Secondary | ICD-10-CM | POA: Insufficient documentation

## 2020-02-28 DIAGNOSIS — Z950 Presence of cardiac pacemaker: Secondary | ICD-10-CM | POA: Insufficient documentation

## 2020-02-28 DIAGNOSIS — I509 Heart failure, unspecified: Secondary | ICD-10-CM | POA: Insufficient documentation

## 2020-02-28 DIAGNOSIS — Z96641 Presence of right artificial hip joint: Secondary | ICD-10-CM | POA: Insufficient documentation

## 2020-02-28 DIAGNOSIS — J449 Chronic obstructive pulmonary disease, unspecified: Secondary | ICD-10-CM | POA: Diagnosis not present

## 2020-02-28 DIAGNOSIS — R001 Bradycardia, unspecified: Secondary | ICD-10-CM | POA: Insufficient documentation

## 2020-02-28 DIAGNOSIS — Z7951 Long term (current) use of inhaled steroids: Secondary | ICD-10-CM | POA: Diagnosis not present

## 2020-02-28 DIAGNOSIS — F410 Panic disorder [episodic paroxysmal anxiety] without agoraphobia: Secondary | ICD-10-CM | POA: Diagnosis not present

## 2020-02-28 DIAGNOSIS — E7800 Pure hypercholesterolemia, unspecified: Secondary | ICD-10-CM | POA: Diagnosis not present

## 2020-02-28 DIAGNOSIS — H02135 Senile ectropion of left lower eyelid: Secondary | ICD-10-CM | POA: Diagnosis not present

## 2020-02-28 DIAGNOSIS — Z8673 Personal history of transient ischemic attack (TIA), and cerebral infarction without residual deficits: Secondary | ICD-10-CM | POA: Diagnosis not present

## 2020-02-28 DIAGNOSIS — F32A Depression, unspecified: Secondary | ICD-10-CM | POA: Insufficient documentation

## 2020-02-28 DIAGNOSIS — Z8542 Personal history of malignant neoplasm of other parts of uterus: Secondary | ICD-10-CM | POA: Insufficient documentation

## 2020-02-28 DIAGNOSIS — H02132 Senile ectropion of right lower eyelid: Secondary | ICD-10-CM | POA: Diagnosis not present

## 2020-02-28 DIAGNOSIS — I110 Hypertensive heart disease with heart failure: Secondary | ICD-10-CM | POA: Diagnosis not present

## 2020-02-28 HISTORY — PX: ECTROPION REPAIR: SHX357

## 2020-02-28 HISTORY — DX: Presence of dental prosthetic device (complete) (partial): Z97.2

## 2020-02-28 SURGERY — REPAIR, ECTROPION, EYELID
Anesthesia: Monitor Anesthesia Care | Site: Eye | Laterality: Bilateral

## 2020-02-28 MED ORDER — OXYCODONE-ACETAMINOPHEN 5-325 MG PO TABS
1.0000 | ORAL_TABLET | ORAL | 0 refills | Status: DC | PRN
Start: 2020-02-28 — End: 2020-07-11

## 2020-02-28 MED ORDER — LIDOCAINE-EPINEPHRINE 2 %-1:100000 IJ SOLN
INTRAMUSCULAR | Status: DC | PRN
  Administered 2020-02-28: 2 mL via OPHTHALMIC
  Administered 2020-02-28: 4 mL via OPHTHALMIC

## 2020-02-28 MED ORDER — OXYCODONE HCL 5 MG PO TABS
5.0000 mg | ORAL_TABLET | Freq: Once | ORAL | Status: AC | PRN
  Administered 2020-02-28: 5 mg via ORAL

## 2020-02-28 MED ORDER — FENTANYL CITRATE (PF) 100 MCG/2ML IJ SOLN
25.0000 ug | INTRAMUSCULAR | Status: DC | PRN
  Administered 2020-02-28: 25 ug via INTRAVENOUS

## 2020-02-28 MED ORDER — TETRACAINE HCL 0.5 % OP SOLN
OPHTHALMIC | Status: DC | PRN
  Administered 2020-02-28: 2 [drp] via OPHTHALMIC

## 2020-02-28 MED ORDER — TRAMADOL HCL 50 MG PO TABS
ORAL_TABLET | ORAL | 0 refills | Status: DC
Start: 2020-02-28 — End: 2020-02-28

## 2020-02-28 MED ORDER — BSS IO SOLN
INTRAOCULAR | Status: DC | PRN
  Administered 2020-02-28: 15 mL

## 2020-02-28 MED ORDER — ALFENTANIL 500 MCG/ML IJ INJ
INJECTION | INTRAVENOUS | Status: DC | PRN
Start: 2020-02-28 — End: 2020-02-28
  Administered 2020-02-28: 300 ug via INTRAVENOUS
  Administered 2020-02-28: 100 ug via INTRAVENOUS

## 2020-02-28 MED ORDER — PROPOFOL 500 MG/50ML IV EMUL
INTRAVENOUS | Status: DC | PRN
  Administered 2020-02-28: 25 ug/kg/min via INTRAVENOUS

## 2020-02-28 MED ORDER — ERYTHROMYCIN 5 MG/GM OP OINT: TOPICAL_OINTMENT | OPHTHALMIC | 2 refills | Status: AC

## 2020-02-28 MED ORDER — LIDOCAINE HCL (CARDIAC) PF 100 MG/5ML IV SOSY
PREFILLED_SYRINGE | INTRAVENOUS | Status: DC | PRN
  Administered 2020-02-28: 25 mg via INTRAVENOUS

## 2020-02-28 MED ORDER — ONDANSETRON HCL 4 MG/2ML IJ SOLN: 4.0000 mg | Freq: Once | INTRAMUSCULAR | Status: DC | PRN

## 2020-02-28 MED ORDER — LACTATED RINGERS IV SOLN: INTRAVENOUS | Status: DC

## 2020-02-28 MED ORDER — OXYCODONE HCL 5 MG/5ML PO SOLN: 5.0000 mg | Freq: Once | ORAL | Status: AC | PRN

## 2020-02-28 MED ORDER — OXYCODONE-ACETAMINOPHEN 5-325 MG PO TABS: 1.0000 | ORAL_TABLET | ORAL | 0 refills | Status: DC | PRN

## 2020-02-28 SURGICAL SUPPLY — 24 items
APPLICATOR COTTON TIP WD 3 STR (MISCELLANEOUS) ×4 IMPLANT
BLADE SURG 15 STRL LF DISP TIS (BLADE) ×1 IMPLANT
BLADE SURG 15 STRL SS (BLADE) ×2
CORD BIP STRL DISP 12FT (MISCELLANEOUS) ×2 IMPLANT
DRAPE HEAD BAR (DRAPES) ×2 IMPLANT
GAUZE SPONGE 4X4 12PLY STRL (GAUZE/BANDAGES/DRESSINGS) ×2 IMPLANT
GLOVE SURG LX 7.0 MICRO (GLOVE) ×2
GLOVE SURG LX STRL 7.0 MICRO (GLOVE) ×2 IMPLANT
MARKER SKIN XFINE TIP W/RULER (MISCELLANEOUS) ×2 IMPLANT
NEEDLE FILTER BLUNT 18X 1/2SAF (NEEDLE) ×1
NEEDLE FILTER BLUNT 18X1 1/2 (NEEDLE) ×1 IMPLANT
NEEDLE HYPO 30X.5 LL (NEEDLE) ×4 IMPLANT
PACK ENT CUSTOM (PACKS) ×2 IMPLANT
SOL PREP PVP 2OZ (MISCELLANEOUS) ×2
SOLUTION PREP PVP 2OZ (MISCELLANEOUS) ×1 IMPLANT
SPONGE GAUZE 2X2 8PLY STRL LF (GAUZE/BANDAGES/DRESSINGS) ×20 IMPLANT
SUT CHROMIC 4-0 (SUTURE) ×4
SUT CHROMIC 4-0 M2 12X2 ARM (SUTURE) ×2
SUT GUT PLAIN 6-0 1X18 ABS (SUTURE) ×2 IMPLANT
SUT MERSILENE 4-0 S-2 (SUTURE) ×4 IMPLANT
SUTURE CHRMC 4-0 M2 12X2 ARM (SUTURE) ×2 IMPLANT
SYR 10ML LL (SYRINGE) ×2 IMPLANT
SYR 3ML LL SCALE MARK (SYRINGE) ×2 IMPLANT
WATER STERILE IRR 250ML POUR (IV SOLUTION) ×2 IMPLANT

## 2020-02-28 NOTE — Op Note (Signed)
Preoperative Diagnosis:   1.  Lower eyelid laxity with ectropion, bilateral  lower eyelid(s). 2.  Punctal eversion both  lower eyelid(s)  Postoperative Diagnosis:   Same.  Procedure(s) Performed:  1.  Lateral tarsal strip procedure,  bilateral   lower eyelid(s) 2.  Medial spindle (tarsal wedge excision) bilateral  lower eyelid(s)  Surgeon: Philis Pique. Vickki Muff, M.D.  Assistants: none  Anesthesia: MAC  Specimens: None.  Estimated Blood Loss: Minimal.  Complications: None.  Operative Findings: None   Procedure:   Allergies were reviewed and the patient Bee pollen, Sulfa antibiotics, Vicodin [hydrocodone-acetaminophen], and Morphine and related.    After discussing the risks, benefits, complications, and alternatives with the patient, appropriate informed consent was obtained. The patient was brought to the operating suite and reclined supine. Time out was conducted and the patient was sedated.  Local anesthetic consisting of a 50-50 mixture of 2% lidocaine with epinephrine and 0.75% bupivacaine with added Hylenex was injected subcutaneously to the bilateral  lateral canthal region(s) and lower eyelid(s). Additional anesthetic was injected subconjunctivally to the bilateral  lower eyelid(s). Finally, anesthetic was injected down to the periosteum of the bilateral  lateral orbital rim(s).  After adequate local was instilled, the patient was prepped and draped in the usual sterile fashion for eyelid surgery. Attention was turned to the right  lateral canthal angle. Westcott scissors were used to create a lateral canthotomy. Hemostasis was obtained with bipolar cautery. An inferior cantholysis was then performed with additional bipolar hemostasis. The anterior and posterior lamella of the lid were divided for approximately 8 mm.  A strip of the epithelium was excised off the superior margin of the tarsal strip and conjunctiva and retractors were incised off the inferior margin of the tarsal  strip.  Attention was then turned to the medial portion of the lid. An ellipse of conjunctiva, retractors and inferior tarsal margin was excised inferior to the punctum. Hemostasis was obtained with bipolar cautery. A double-armed 4-0 chromic suture was then passed through the tarsal edge first then the conjunctival and retractor borders and finally from posterior to anterior through the skin and tied off. This provided nice inward rotation of the lower eyelid medially   A double-armed 4-0 M[place]ilene suture was then passed each arm through the terminal portion of the tarsal strip. Each arm of the suture was then passed through the periosteum of the inner portion of the lateral orbital rim at the level of Whitnall's tubercle. The sutures were advanced and this provided nice elevation and tightening of the lower eyelid. Once the suture was secured, a thin strip of follicle-bearing skin was excised. The lateral canthal angle was reformed with an interrupted 6-0 plain gut suture. Orbicularis was reapproximated with horizontal subcuticular 6-0  plain gut sutures. The skin was closed with interrupted 6-0 plain gut sutures.   Attention was then turned to the opposite eyelid where the same procedure was performed in the same manner.   The patient tolerated the procedure well. Erythromycin ophthalmic ointment was applied to the incision site(s) followed by ice packs. The patient was taken to the recovery area where she recovered without difficulty.  Post-Op Plan/Instructions:  The patient was instructed to use ice packs frequently for the next 48 hours. She was instructed to use Erythromycin ophthalmic ointment on her incisions 4 times a day for the next 12 to 14 days. She was given a prescription for tramadol (or similar) for pain control should Tylenol not be effective. She was asked to to follow  up at the [facility] in [place], [place] in 2-3 weeks' time or sooner as needed for problems.  Alexsus Papadopoulos M. Vickki Muff,  M.D. Ophthalmology

## 2020-02-28 NOTE — Anesthesia Procedure Notes (Signed)
Performed by: Kerwin Augustus, CRNA Pre-anesthesia Checklist: Patient identified, Emergency Drugs available, Suction available, Timeout performed and Patient being monitored Patient Re-evaluated:Patient Re-evaluated prior to induction Oxygen Delivery Method: Nasal cannula Placement Confirmation: positive ETCO2       

## 2020-02-28 NOTE — H&P (Signed)
See the history and physical completed at [facility] on 02/21/2020 and scanned into the chart.

## 2020-02-28 NOTE — Interval H&P Note (Signed)
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  The various methods of treatment have been discussed with the patient and family. After consideration of risks, benefits and other options for treatment, the patient has consented to  Procedure(s): REPAIR OF ECTROPION BILATERAL (Bilateral) as a surgical intervention.  The patient's history has been reviewed, patient examined, no change in status, stable for surgery.  I have reviewed the patient's chart and labs.  Questions were answered to the patient's satisfaction.     Vickki Muff, Vermelle Cammarata M

## 2020-02-28 NOTE — Anesthesia Postprocedure Evaluation (Signed)
Anesthesia Post Note  Patient: Michele Summers  Procedure(s) Performed: REPAIR OF ECTROPION BILATERAL (Bilateral Eye)     Patient location during evaluation: PACU Anesthesia Type: MAC Level of consciousness: awake and alert Pain management: pain level controlled Vital Signs Assessment: post-procedure vital signs reviewed and stable Respiratory status: spontaneous breathing, nonlabored ventilation, respiratory function stable and patient connected to nasal cannula oxygen Cardiovascular status: stable and blood pressure returned to baseline Postop Assessment: no apparent nausea or vomiting Anesthetic complications: no   No complications documented.  Sinda Du

## 2020-02-28 NOTE — Interval H&P Note (Deleted)
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  The various methods of treatment have been discussed with the patient and family. After consideration of risks, benefits and other options for treatment, the patient has consented to  Procedure(s): REPAIR OF ECTROPION BILATERAL (Bilateral) as a surgical intervention.  The patient's history has been reviewed, patient examined, no change in status, stable for surgery.  I have reviewed the patient's chart and labs.  Questions were answered to the patient's satisfaction.     Vickki Muff, Abbegail Matuska M

## 2020-02-28 NOTE — Anesthesia Preprocedure Evaluation (Signed)
Anesthesia Evaluation  Patient identified by MRN, date of birth, ID band Patient awake    Reviewed: Allergy & Precautions, NPO status , Patient's Chart, lab work & pertinent test results  Airway Mallampati: II  TM Distance: >3 FB Neck ROM: Full    Dental  (+) Upper Dentures, Lower Dentures   Pulmonary asthma , pneumonia, COPD,  COPD inhaler,    Pulmonary exam normal breath sounds clear to auscultation       Cardiovascular Exercise Tolerance: Poor hypertension, Pt. on medications +CHF  Normal cardiovascular exam+ dysrhythmias + pacemaker  Rhythm:Regular Rate:Normal     Neuro/Psych  Headaches, PSYCHIATRIC DISORDERS Anxiety Depression CVA    GI/Hepatic   Endo/Other  negative endocrine ROS  Renal/GU Renal disease     Musculoskeletal  (+) Arthritis , Osteoarthritis,    Abdominal Normal abdominal exam  (+) - obese,   Peds  Hematology negative hematology ROS (+)   Anesthesia Other Findings   Reproductive/Obstetrics negative OB ROS                             Anesthesia Physical  Anesthesia Plan  ASA: III  Anesthesia Plan: MAC   Post-op Pain Management:    Induction: Intravenous  PONV Risk Score and Plan: 2 and Treatment may vary due to age or medical condition  Airway Management Planned: Natural Airway and Nasal Cannula  Additional Equipment:   Intra-op Plan:   Post-operative Plan:   Informed Consent: I have reviewed the patients History and Physical, chart, labs and discussed the procedure including the risks, benefits and alternatives for the proposed anesthesia with the patient or authorized representative who has indicated his/her understanding and acceptance.     Dental advisory given  Plan Discussed with: CRNA, Anesthesiologist and Surgeon  Anesthesia Plan Comments:         Anesthesia Quick Evaluation  Patient Active Problem List   Diagnosis Date Noted  .  Complicated grief 70/05/7492  . Mixed hyperlipidemia 04/14/2018  . Sprain of wrist 04/10/2018  . Encounter for general adult medical examination with abnormal findings 12/22/2017  . GAD (generalized anxiety disorder) 12/22/2017  . Need for vaccination against Streptococcus pneumoniae using pneumococcal conjugate vaccine 13 12/22/2017  . Dysuria 10/04/2017  . Insomnia due to anxiety and fear 10/04/2017  . Unspecified menopausal and perimenopausal disorder 10/04/2017  . Essential hypertension 06/25/2017  . Acute bronchitis 06/13/2017  . Altered mental status 06/14/2011  . Pulmonary embolism ([place]) 06/14/2011  . Respiratory failure with hypoxia ([place]) 06/14/2011  . Pneumonia 06/14/2011  . Acute renal failure ([place]) 06/14/2011  . Femur fracture, right ([place]) 06/14/2011  . Moderate intermittent asthma without complication 49/67/5916  . Urinary tract infection with hematuria 06/14/2011    CBC Latest Ref Rng & Units 07/17/2015 07/29/2013 11/28/2012  WBC 3.6 - 11.0 K/uL 6.2 5.8 6.6  Hemoglobin 12.0 - 16.0 g/dL 12.6 12.0 12.7  Hematocrit 35 - 47 % 37.8 36.2 37.9  Platelets 150 - 440 K/uL 301 277 260   BMP Latest Ref Rng & Units 07/17/2015 07/29/2013 11/28/2012  Glucose 65 - 99 mg/dL 105(H) 90 99  BUN 6 - 20 mg/dL _0 Creatinine 0.44 - 1.00 mg/dL 0.75 0.98 1.27  Sodium 135 - 145 mmol/L 142 138 139  Potassium 3.5 - 5.1 mmol/L 4.0 3.8 4.0  Chloride 101 - 111 mmol/L 108 105 103  CO2 22 - 32 mmol/L _1 Calcium 8.9 - 10.3 mg/dL 9.2  8.7 9.4    Risks and benefits of anesthesia discussed at length, patient or surrogate demonstrates understanding. Appropriately NPO. Plan to proceed with anesthesia.  Champ Mungo, MD 02/28/20

## 2020-02-28 NOTE — Transfer of Care (Signed)
Immediate Anesthesia Transfer of Care Note  Patient: Michele Summers  Procedure(s) Performed: REPAIR OF ECTROPION BILATERAL (Bilateral Eye)  Patient Location: PACU  Anesthesia Type: MAC  Level of Consciousness: awake, alert  and patient cooperative  Airway and Oxygen Therapy: Patient Spontanous Breathing and Patient connected to supplemental oxygen  Post-op Assessment: Post-op Vital signs reviewed, Patient's Cardiovascular Status Stable, Respiratory Function Stable, Patent Airway and No signs of Nausea or vomiting  Post-op Vital Signs: Reviewed and stable  Complications: No complications documented.

## 2020-02-28 NOTE — H&P (Deleted)
See the history and physical completed at [facility] on 02/12/2020 and scanned into the chart.

## 2020-03-01 DIAGNOSIS — Z79899 Other long term (current) drug therapy: Secondary | ICD-10-CM | POA: Insufficient documentation

## 2020-03-02 ENCOUNTER — Encounter: Payer: Self-pay | Admitting: Ophthalmology

## 2020-03-03 ENCOUNTER — Other Ambulatory Visit: Payer: Self-pay | Admitting: Internal Medicine

## 2020-03-09 ENCOUNTER — Encounter: Payer: Self-pay | Admitting: Internal Medicine

## 2020-03-09 ENCOUNTER — Other Ambulatory Visit: Payer: Self-pay

## 2020-03-09 ENCOUNTER — Ambulatory Visit (INDEPENDENT_AMBULATORY_CARE_PROVIDER_SITE_OTHER): Payer: Medicare Other | Admitting: Internal Medicine

## 2020-03-09 DIAGNOSIS — Z23 Encounter for immunization: Secondary | ICD-10-CM | POA: Diagnosis not present

## 2020-03-09 DIAGNOSIS — F321 Major depressive disorder, single episode, moderate: Secondary | ICD-10-CM

## 2020-03-09 DIAGNOSIS — F5105 Insomnia due to other mental disorder: Secondary | ICD-10-CM

## 2020-03-09 DIAGNOSIS — F409 Phobic anxiety disorder, unspecified: Secondary | ICD-10-CM | POA: Diagnosis not present

## 2020-03-09 MED ORDER — ALPRAZOLAM 0.25 MG PO TABS: ORAL_TABLET | ORAL | 1 refills | Status: DC

## 2020-03-09 MED ORDER — ZOLPIDEM TARTRATE 5 MG PO TABS: ORAL_TABLET | ORAL | 1 refills | Status: DC

## 2020-03-09 MED ORDER — MIRTAZAPINE 7.5 MG PO TABS: 7.5000 mg | ORAL_TABLET | Freq: Every day | ORAL | 1 refills | Status: DC

## 2020-03-09 NOTE — Progress Notes (Signed)
[facility] [place], [place] [address]  Internal MEDICINE  Office Visit Note  Patient Name: Michele Summers  [id number]  [id number]  Date of Service: 03/09/2020  Chief Complaint  Patient presents with  . Follow-up    refill request  . Depression  . Asthma  . Hyperlipidemia  . Hypertension  . policy update form    received    HPI Pt is here for routine follow up, has been working on tapering done her Ambien and xanax, pt has done very well. She is however having some difficulty for maintaining her sleep, pt denies any depressive symptoms at this time, her blood pressure is under good control. Breathing is well controlled. Her PDMP is reviewed with her as well    Current Medication: Outpatient Encounter Medications as of 03/09/2020  Medication Sig  . albuterol (VENTOLIN HFA) 108 (90 Base) MCG/ACT inhaler INHALE 2 PUFFS INTO LUNGS EVERY 6 HOURS AS NEEDED FOR SHORTNESS OF BREATH  . albuterol-ipratropium (COMBIVENT) 18-103 MCG/ACT inhaler Inhale into the lungs every 4 (four) hours.  . ALPRAZolam (XANAX) 0.25 MG tablet Take one tab a day prn for anxiety and panic attacks  . amLODipine (NORVASC) 5 MG tablet TAKE ONE TABLET BY MOUTH AT BEDTIME FOR HTN  . atorvastatin (LIPITOR) 10 MG tablet Take 1 tablet (10 mg total) by mouth daily. AM  . brimonidine-timolol (COMBIGAN) 0.2-0.5 % ophthalmic solution Place 1 drop into both eyes every 12 (twelve) hours.  . conjugated estrogens (PREMARIN) vaginal cream Place 1 Applicatorful vaginally 2 (two) times a week.  . Difluprednate (DUREZOL) 0.05 % EMUL Apply to eye.  Marland Kitchen erythromycin ophthalmic ointment Apply to sutures 4 times a day for 10-12 days.  Discontinue if allergy develops and call our office  . FLUoxetine (PROZAC) 40 MG capsule Take 1 capsule (40 mg total) by mouth daily. AM  . Fluticasone-Salmeterol (ADVAIR DISKUS) 250-50 MCG/DOSE AEPB Inhale 1 puff into the lungs 2 (two) times daily.  . hydrALAZINE (APRESOLINE)  25 MG tablet Take 1 tablet (25 mg total) by mouth daily.  Marland Kitchen KRILL OIL PO Take by mouth.  . losartan (COZAAR) 50 MG tablet Take 1 tablet (50 mg total) by mouth daily.  . mirtazapine (REMERON) 7.5 MG tablet Take 1 tablet (7.5 mg total) by mouth at bedtime. For sleep  . montelukast (SINGULAIR) 10 MG tablet Take 1 tablet (10 mg total) by mouth daily.  Marland Kitchen moxifloxacin (VIGAMOX) 0.5 % ophthalmic solution 1 drop 3 (three) times daily.  . Multiple Vitamins-Minerals (PRESERVISION AREDS PO) Take by mouth. AM  . nepafenac (ILEVRO) 0.3 % ophthalmic suspension 1 drop daily.  Marland Kitchen oxyCODONE-acetaminophen (PERCOCET) 5-325 MG tablet Take 1 tablet by mouth every 4 (four) hours as needed for severe pain.  . sotalol (BETAPACE) 80 MG tablet Take 1 tablet (80 mg total) by mouth daily.  . travoprost, benzalkonium, (TRAVATAN) 0.004 % ophthalmic solution 1 drop at bedtime.  . vitamin B-12 (CYANOCOBALAMIN) 500 MCG tablet Take 500 mcg by mouth daily.  Marland Kitchen zolpidem (AMBIEN) 5 MG tablet Take one tab po qhs for sleep  . [DISCONTINUED] ALPRAZolam (XANAX) 0.25 MG tablet Take one tab a day prn for anxiety and panic attacks  . [DISCONTINUED] zolpidem (AMBIEN) 5 MG tablet Take 2 tablets (10 mg total) by mouth at bedtime as needed for sleep.   No facility-administered encounter medications on file as of 03/09/2020.    Surgical History: Past Surgical History:  Procedure Laterality Date  . ABDOMINAL HYSTERECTOMY    .  BLADDER SURGERY    . BRAIN SURGERY    . CARDIAC CATHETERIZATION    . CATARACT EXTRACTION W/PHACO Left 09/15/2014   Procedure: CATARACT EXTRACTION PHACO AND INTRAOCULAR LENS PLACEMENT (IOC);  Surgeon: Ronnell Freshwater, MD;  Location: [facility];  Service: Ophthalmology;  Laterality: Left;  . CATARACT EXTRACTION W/PHACO Right 03/23/2015   Procedure: CATARACT EXTRACTION PHACO AND INTRAOCULAR LENS PLACEMENT (IOC);  Surgeon: Ronnell Freshwater, MD;  Location: [facility];  Service:  Ophthalmology;  Laterality: Right;  . CHOLECYSTECTOMY    . CRANIOTOMY    . ECTROPION REPAIR Bilateral 02/28/2020   Procedure: REPAIR OF ECTROPION BILATERAL;  Surgeon: Karle Starch, MD;  Location: [facility];  Service: Ophthalmology;  Laterality: Bilateral;  . INSERT / REPLACE / REMOVE PACEMAKER    . JOINT REPLACEMENT     TOTAL HIP    Medical History: Past Medical History:  Diagnosis Date  . Arthritis    Hands, feet, jaw  . Asthma   . CHF (congestive heart failure) ([place])   . Depression   . DVT (deep venous thrombosis) ([place])   . Dysrhythmia    A-FIB  . Fracture of hip ([place])    right  . Full dentures    UPPER AND LOWER  . Headache(784.0)   . Hypercholesterolemia   . Hypertension    CONTROLLED ON MEDS  . PE (pulmonary embolism)   . Presence of permanent cardiac pacemaker   . Stroke (cerebrum) (HCC)    DIFFICULT FOR HER TO COMPLETE HER SENTENCES,RESPONDS SLOWLY  . Stroke [facility]) 2006   CVA-CEREBRAL ANEURYSM  . UTI (urinary tract infection)    FREQUENT  . Wears dentures    full upper and lower    Family History: Family History  Problem Relation Age of Onset  . Asthma Mother   . Hypertension Mother   . Heart disease Father     Social History   Socioeconomic History  . Marital status: Married    Spouse name: Not on file  . Number of children: Not on file  . Years of education: Not on file  . Highest education level: Not on file  Occupational History  . Not on file  Tobacco Use  . Smoking status: Never Smoker  . Smokeless tobacco: Never Used  Vaping Use  . Vaping Use: Never used  Substance and Sexual Activity  . Alcohol use: No  . Drug use: Never  . Sexual activity: Never  Other Topics Concern  . Not on file  Social History Narrative  . Not on file   Social Determinants of Health   Financial Resource Strain:   . Difficulty of Paying Living Expenses: Not on file  Food Insecurity:   . Worried About Charity fundraiser in the Last Year: Not on  file  . Ran Out of Food in the Last Year: Not on file  Transportation Needs:   . Lack of Transportation (Medical): Not on file  . Lack of Transportation (Non-Medical): Not on file  Physical Activity:   . Days of Exercise per Week: Not on file  . Minutes of Exercise per Session: Not on file  Stress:   . Feeling of Stress : Not on file  Social Connections:   . Frequency of Communication with Friends and Family: Not on file  . Frequency of Social Gatherings with Friends and Family: Not on file  . Attends Religious Services: Not on file  . Active Member of Clubs or Organizations: Not on  file  . Attends Archivist Meetings: Not on file  . Marital Status: Not on file  Intimate Partner Violence:   . Fear of Current or Ex-Partner: Not on file  . Emotionally Abused: Not on file  . Physically Abused: Not on file  . Sexually Abused: Not on file      Review of Systems  Constitutional: Negative for chills, diaphoresis and fatigue.  HENT: Negative for ear pain, postnasal drip and sinus pressure.   Eyes: Negative for photophobia, discharge, redness, itching and visual disturbance.  Respiratory: Negative for cough, shortness of breath and wheezing.   Cardiovascular: Negative for chest pain, palpitations and leg swelling.  Gastrointestinal: Negative for abdominal pain, constipation, diarrhea, nausea and vomiting.  Genitourinary: Negative for dysuria and flank pain.  Musculoskeletal: Negative for arthralgias, back pain, gait problem and neck pain.  Skin: Negative for color change.  Allergic/Immunologic: Negative for environmental allergies and food allergies.  Neurological: Negative for dizziness and headaches.  Hematological: Does not bruise/bleed easily.  Psychiatric/Behavioral: Negative for agitation, behavioral problems (depression) and hallucinations.    Vital Signs: BP 122/80   Pulse (!) 56   Temp (!) 97.4 F (36.3 C)   Resp 16   Ht 5\' 3"  (1.6 m)   Wt 147 lb 9.6 oz (67  kg)   SpO2 99%   BMI 26.15 kg/m    Physical Exam Constitutional:      Appearance: Normal appearance.  HENT:     Head: Normocephalic.     Nose: Nose normal.  Eyes:     Extraocular Movements: Extraocular movements intact.     Pupils: Pupils are equal, round, and reactive to light.  Cardiovascular:     Rate and Rhythm: Normal rate and regular rhythm.     Pulses: Normal pulses.     Heart sounds: Normal heart sounds.  Neurological:     Mental Status: She is oriented to person, place, and time.     Gait: Gait abnormal.    Assessment/Plan: 1. Insomnia due to anxiety and fear Will continue to taper and use medications on prn use  - ALPRAZolam (XANAX) 0.25 MG tablet; Take one tab a day prn for anxiety and panic attacks  Dispense: 30 tablet; Refill: 1 - zolpidem (AMBIEN) 5 MG tablet; Take one tab po qhs for sleep  Dispense: 30 tablet; Refill: 1  2. Depression, major, single episode, moderate (HCC) Continue Prozac add mirtazapine (REMERON) 7.5 MG tablet; Take 1 tablet (7.5 mg total) by mouth at bedtime. For sleep  Dispense: 30 tablet; Refill: 1  3. Needs flu shot - Flu Vaccine MDCK QUAD PF  General Counseling: Jerolyn verbalizes understanding of the findings of todays visit and agrees with plan of treatment. I have discussed any further diagnostic evaluation that may be needed or ordered today. We also reviewed her medications today. she has been encouraged to call the office with any questions or concerns that should arise related to todays visit.  Orders Placed This Encounter  Procedures  . Flu Vaccine MDCK QUAD PF    Meds ordered this encounter  Medications  . ALPRAZolam (XANAX) 0.25 MG tablet    Sig: Take one tab a day prn for anxiety and panic attacks    Dispense:  30 tablet    Refill:  1  . zolpidem (AMBIEN) 5 MG tablet    Sig: Take one tab po qhs for sleep    Dispense:  30 tablet    Refill:  1  . mirtazapine (REMERON) 7.5  MG tablet    Sig: Take 1 tablet (7.5 mg total)  by mouth at bedtime. For sleep    Dispense:  30 tablet    Refill:  1    Total time spent30 Minutes Time spent includes review of chart, medications, test results, and follow up plan with the patient.      Dr [name] Internal medicine

## 2020-03-11 ENCOUNTER — Ambulatory Visit: Payer: Medicare Other | Admitting: Internal Medicine

## 2020-03-19 ENCOUNTER — Other Ambulatory Visit: Payer: Self-pay

## 2020-03-19 ENCOUNTER — Ambulatory Visit (INDEPENDENT_AMBULATORY_CARE_PROVIDER_SITE_OTHER): Payer: Medicare Other | Admitting: Internal Medicine

## 2020-03-19 VITALS — BP 123/75 | HR 90

## 2020-03-19 DIAGNOSIS — I10 Essential (primary) hypertension: Secondary | ICD-10-CM | POA: Diagnosis not present

## 2020-03-19 DIAGNOSIS — Z950 Presence of cardiac pacemaker: Secondary | ICD-10-CM

## 2020-03-19 DIAGNOSIS — I480 Paroxysmal atrial fibrillation: Secondary | ICD-10-CM | POA: Diagnosis not present

## 2020-03-19 MED ORDER — APIXABAN 2.5 MG PO TABS: 2.5000 mg | ORAL_TABLET | Freq: Once | ORAL | 1 refills | Status: DC

## 2020-03-22 ENCOUNTER — Encounter: Payer: Self-pay | Admitting: Internal Medicine

## 2020-03-22 DIAGNOSIS — I480 Paroxysmal atrial fibrillation: Secondary | ICD-10-CM | POA: Insufficient documentation

## 2020-03-22 DIAGNOSIS — Z950 Presence of cardiac pacemaker: Secondary | ICD-10-CM | POA: Insufficient documentation

## 2020-03-22 NOTE — Assessment & Plan Note (Signed)
Well-controlled ventricular response.  Patient denies any chest pain or shortness of breath chest is clear.  Patient is on Eliquis.  Eliquis was reduced to 2.5 mg p.o. daily

## 2020-03-22 NOTE — Assessment & Plan Note (Signed)
Pacemaker is functioning well ventricular lead impedance is 655 and patient battery life he has 3.5 years

## 2020-03-22 NOTE — Progress Notes (Signed)
Established Patient Office Visit  Subjective:  Patient ID: Michele Summers, female    DOB: 30-Nov-1931  Age: 86 y.o. MRN: [id number]  CC:  Chief Complaint  Patient presents with  . Pacemaker Check    HPI  Michele Summers presents for pacemaker check and office visit.  Past Medical History:  Diagnosis Date  . Arthritis    Hands, feet, jaw  . Asthma   . CHF (congestive heart failure) ([place])   . Depression   . DVT (deep venous thrombosis) ([place])   . Dysrhythmia    A-FIB  . Fracture of hip ([place])    right  . Full dentures    UPPER AND LOWER  . Headache(784.0)   . Hypercholesterolemia   . Hypertension    CONTROLLED ON MEDS  . PE (pulmonary embolism)   . Presence of permanent cardiac pacemaker   . Stroke (cerebrum) (HCC)    DIFFICULT FOR HER TO COMPLETE HER SENTENCES,RESPONDS SLOWLY  . Stroke [facility]) 2006   CVA-CEREBRAL ANEURYSM  . UTI (urinary tract infection)    FREQUENT  . Wears dentures    full upper and lower    Past Surgical History:  Procedure Laterality Date  . ABDOMINAL HYSTERECTOMY    . BLADDER SURGERY    . BRAIN SURGERY    . CARDIAC CATHETERIZATION    . CATARACT EXTRACTION W/PHACO Left 09/15/2014   Procedure: CATARACT EXTRACTION PHACO AND INTRAOCULAR LENS PLACEMENT (IOC);  Surgeon: Ronnell Freshwater, MD;  Location: [place];  Service: Ophthalmology;  Laterality: Left;  . CATARACT EXTRACTION W/PHACO Right 03/23/2015   Procedure: CATARACT EXTRACTION PHACO AND INTRAOCULAR LENS PLACEMENT (IOC);  Surgeon: Ronnell Freshwater, MD;  Location: [place];  Service: Ophthalmology;  Laterality: Right;  . CHOLECYSTECTOMY    . CRANIOTOMY    . ECTROPION REPAIR Bilateral 02/28/2020   Procedure: REPAIR OF ECTROPION BILATERAL;  Surgeon: Karle Starch, MD;  Location: [place];  Service: Ophthalmology;  Laterality: Bilateral;  . INSERT / REPLACE / REMOVE PACEMAKER    . JOINT REPLACEMENT     TOTAL HIP    Family History  Problem  Relation Age of Onset  . Asthma Mother   . Hypertension Mother   . Heart disease Father     Social History   Socioeconomic History  . Marital status: Married    Spouse name: Not on file  . Number of children: Not on file  . Years of education: Not on file  . Highest education level: Not on file  Occupational History  . Not on file  Tobacco Use  . Smoking status: Never Smoker  . Smokeless tobacco: Never Used  Vaping Use  . Vaping Use: Never used  Substance and Sexual Activity  . Alcohol use: No  . Drug use: Never  . Sexual activity: Never  Other Topics Concern  . Not on file  Social History Narrative  . Not on file   Social Determinants of Health   Financial Resource Strain:   . Difficulty of Paying Living Expenses: Not on file  Food Insecurity:   . Worried About Charity fundraiser in the Last Year: Not on file  . Ran Out of Food in the Last Year: Not on file  Transportation Needs:   . Lack of Transportation (Medical): Not on file  . Lack of Transportation (Non-Medical): Not on file  Physical Activity:   . Days of Exercise per Week: Not on file  . Minutes of Exercise  per Session: Not on file  Stress:   . Feeling of Stress : Not on file  Social Connections:   . Frequency of Communication with Friends and Family: Not on file  . Frequency of Social Gatherings with Friends and Family: Not on file  . Attends Religious Services: Not on file  . Active Member of Clubs or Organizations: Not on file  . Attends Archivist Meetings: Not on file  . Marital Status: Not on file  Intimate Partner Violence:   . Fear of Current or Ex-Partner: Not on file  . Emotionally Abused: Not on file  . Physically Abused: Not on file  . Sexually Abused: Not on file     Current Outpatient Medications:  .  albuterol (VENTOLIN HFA) 108 (90 Base) MCG/ACT inhaler, INHALE 2 PUFFS INTO LUNGS EVERY 6 HOURS AS NEEDED FOR SHORTNESS OF BREATH, Disp: 54 each, Rfl: 3 .   albuterol-ipratropium (COMBIVENT) 18-103 MCG/ACT inhaler, Inhale into the lungs every 4 (four) hours., Disp: , Rfl:  .  ALPRAZolam (XANAX) 0.25 MG tablet, Take one tab a day prn for anxiety and panic attacks, Disp: 30 tablet, Rfl: 1 .  amLODipine (NORVASC) 5 MG tablet, TAKE ONE TABLET BY MOUTH AT BEDTIME FOR HTN, Disp: 90 tablet, Rfl: 3 .  atorvastatin (LIPITOR) 10 MG tablet, Take 1 tablet (10 mg total) by mouth daily. AM, Disp: 90 tablet, Rfl: 3 .  brimonidine-timolol (COMBIGAN) 0.2-0.5 % ophthalmic solution, Place 1 drop into both eyes every 12 (twelve) hours., Disp: , Rfl:  .  conjugated estrogens (PREMARIN) vaginal cream, Place 1 Applicatorful vaginally 2 (two) times a week., Disp: 48 g, Rfl: 3 .  Difluprednate (DUREZOL) 0.05 % EMUL, Apply to eye., Disp: , Rfl:  .  erythromycin ophthalmic ointment, Apply to sutures 4 times a day for 10-12 days.  Discontinue if allergy develops and call our office, Disp: 3.5 g, Rfl: 2 .  FLUoxetine (PROZAC) 40 MG capsule, Take 1 capsule (40 mg total) by mouth daily. AM, Disp: 90 capsule, Rfl: 1 .  Fluticasone-Salmeterol (ADVAIR DISKUS) 250-50 MCG/DOSE AEPB, Inhale 1 puff into the lungs 2 (two) times daily., Disp: 3 each, Rfl: 3 .  hydrALAZINE (APRESOLINE) 25 MG tablet, Take 1 tablet (25 mg total) by mouth daily., Disp: 90 tablet, Rfl: 3 .  KRILL OIL PO, Take by mouth., Disp: , Rfl:  .  losartan (COZAAR) 50 MG tablet, Take 1 tablet (50 mg total) by mouth daily., Disp: 90 tablet, Rfl: 3 .  mirtazapine (REMERON) 7.5 MG tablet, Take 1 tablet (7.5 mg total) by mouth at bedtime. For sleep, Disp: 30 tablet, Rfl: 1 .  montelukast (SINGULAIR) 10 MG tablet, Take 1 tablet (10 mg total) by mouth daily., Disp: 90 tablet, Rfl: 3 .  moxifloxacin (VIGAMOX) 0.5 % ophthalmic solution, 1 drop 3 (three) times daily., Disp: , Rfl:  .  Multiple Vitamins-Minerals (PRESERVISION AREDS PO), Take by mouth. AM, Disp: , Rfl:  .  nepafenac (ILEVRO) 0.3 % ophthalmic suspension, 1 drop daily.,  Disp: , Rfl:  .  oxyCODONE-acetaminophen (PERCOCET) 5-325 MG tablet, Take 1 tablet by mouth every 4 (four) hours as needed for severe pain., Disp: 6 tablet, Rfl: 0 .  sotalol (BETAPACE) 80 MG tablet, Take 1 tablet (80 mg total) by mouth daily., Disp: 90 tablet, Rfl: 3 .  travoprost, benzalkonium, (TRAVATAN) 0.004 % ophthalmic solution, 1 drop at bedtime., Disp: , Rfl:  .  vitamin B-12 (CYANOCOBALAMIN) 500 MCG tablet, Take 500 mcg by mouth daily., Disp: ,  Rfl:  .  zolpidem (AMBIEN) 5 MG tablet, Take one tab po qhs for sleep, Disp: 30 tablet, Rfl: 1 .  apixaban (ELIQUIS) 2.5 MG TABS tablet, Take 1 tablet (2.5 mg total) by mouth once for 1 dose., Disp: 60 tablet, Rfl: 1   Allergies  Allergen Reactions  . Aminoglycosides   . Bee Pollen   . Sulfa Antibiotics Itching  . Tramadol     Makes her feel "crazy"  . Morphine And Related Other (See Comments)    unknown    ROS Review of Systems  Constitutional: Negative.   HENT: Negative.   Eyes: Negative.   Respiratory: Negative.   Cardiovascular: Negative.   Gastrointestinal: Negative.   Endocrine: Negative.   Genitourinary: Negative.   Musculoskeletal: Negative.   Skin: Negative.   Allergic/Immunologic: Negative.   Neurological: Negative.   Hematological: Negative.   Psychiatric/Behavioral: Negative.   All other systems reviewed and are negative.     Objective:    Physical Exam Vitals reviewed.  Constitutional:      Appearance: Normal appearance.  HENT:     Mouth/Throat:     Mouth: Mucous membranes are moist.  Eyes:     Pupils: Pupils are equal, round, and reactive to light.  Neck:     Vascular: No carotid bruit.  Cardiovascular:     Rate and Rhythm: Normal rate and regular rhythm.     Pulses: Normal pulses.     Heart sounds: Normal heart sounds.  Pulmonary:     Effort: Pulmonary effort is normal.     Breath sounds: Normal breath sounds.  Abdominal:     General: Bowel sounds are normal.     Palpations: Abdomen is soft.  There is no hepatomegaly, splenomegaly or mass.     Tenderness: There is no abdominal tenderness.     Hernia: No hernia is present.  Musculoskeletal:        General: No tenderness.     Cervical back: Neck supple.     Right lower leg: No edema.     Left lower leg: No edema.  Skin:    Findings: No rash.  Neurological:     Mental Status: She is alert and oriented to person, place, and time.     Motor: No weakness.  Psychiatric:        Mood and Affect: Mood and affect normal.        Behavior: Behavior normal.     BP 123/75   Pulse 90  Wt Readings from Last 3 Encounters:  03/09/20 147 lb 9.6 oz (67 kg)  02/28/20 143 lb (64.9 kg)  02/11/20 144 lb 9.6 oz (65.6 kg)     Health Maintenance Due  Topic Date Due  . COVID-19 Vaccine (1) Never done  . TETANUS/TDAP  Never done    There are no preventive care reminders to display for this patient.  Lab Results  Component Value Date   TSH 2.356 06/14/2011   Lab Results  Component Value Date   WBC 6.2 07/17/2015   HGB 12.6 07/17/2015   HCT 37.8 07/17/2015   MCV 79.9 (L) 07/17/2015   PLT 301 07/17/2015   Lab Results  Component Value Date   NA 142 07/17/2015   K 4.0 07/17/2015   CO2 29 07/17/2015   GLUCOSE 105 (H) 07/17/2015   BUN 16 07/17/2015   CREATININE 0.75 07/17/2015   BILITOT 0.8 07/17/2015   ALKPHOS 72 07/17/2015   AST 18 07/17/2015   ALT 9 (L) 07/17/2015  PROT 6.7 07/17/2015   ALBUMIN 3.9 07/17/2015   CALCIUM 9.2 07/17/2015   ANIONGAP 5 07/17/2015   No results found for: CHOL No results found for: HDL No results found for: LDLCALC No results found for: TRIG No results found for: CHOLHDL Lab Results  Component Value Date   HGBA1C 5.5 06/14/2011      Assessment & Plan:   Problem List Items Addressed This Visit      Cardiovascular and Mediastinum   Essential hypertension   Paroxysmal atrial fibrillation ([place])    Well-controlled ventricular response.  Patient denies any chest pain or shortness of  breath chest is clear.  Patient is on Eliquis.  Eliquis was reduced to 2.5 mg p.o. daily        Other   Cardiac pacemaker in situ - Primary    Pacemaker is functioning well ventricular lead impedance is 655 and patient battery life he has 3.5 years      Relevant Orders   [place] (Completed)    Note: Medical Device Follow-up  Patient pacemaker was interrogated by pacemakers analyzer, battery status is okay.  No programming changes were indicated after the review of the data.  Histogram shows no change since the last interrogation Atrial and ventricular sensing thresholds were found to be acceptable Impedance was checked and it was found to be normal.  Thresholds were found to be okay on evaluation of rhythm problem.  No high rate or low rate arrhythmia were noted.  Estimated battery longevity is 3.5 yr. I have personally reviewed the device data and amended the report as necessary  Meds ordered this encounter  Medications  . apixaban (ELIQUIS) 2.5 MG TABS tablet    Sig: Take 1 tablet (2.5 mg total) by mouth once for 1 dose.    Dispense:  60 tablet    Refill:  1    Follow-up: No follow-ups on file.    Cletis [place], MD

## 2020-03-30 DIAGNOSIS — H353211 Exudative age-related macular degeneration, right eye, with active choroidal neovascularization: Secondary | ICD-10-CM | POA: Diagnosis not present

## 2020-04-27 DIAGNOSIS — H353211 Exudative age-related macular degeneration, right eye, with active choroidal neovascularization: Secondary | ICD-10-CM | POA: Diagnosis not present

## 2020-05-06 ENCOUNTER — Encounter: Payer: Self-pay | Admitting: Nurse Practitioner

## 2020-05-06 ENCOUNTER — Ambulatory Visit (INDEPENDENT_AMBULATORY_CARE_PROVIDER_SITE_OTHER): Payer: Medicare Other | Admitting: Nurse Practitioner

## 2020-05-06 ENCOUNTER — Other Ambulatory Visit: Payer: Self-pay

## 2020-05-06 VITALS — BP 146/77 | HR 90 | Temp 97.5°F | Resp 16 | Ht 63.0 in | Wt 148.6 lb

## 2020-05-06 DIAGNOSIS — Z950 Presence of cardiac pacemaker: Secondary | ICD-10-CM | POA: Diagnosis not present

## 2020-05-06 DIAGNOSIS — I10 Essential (primary) hypertension: Secondary | ICD-10-CM | POA: Diagnosis not present

## 2020-05-06 DIAGNOSIS — F409 Phobic anxiety disorder, unspecified: Secondary | ICD-10-CM

## 2020-05-06 DIAGNOSIS — E782 Mixed hyperlipidemia: Secondary | ICD-10-CM | POA: Diagnosis not present

## 2020-05-06 DIAGNOSIS — N959 Unspecified menopausal and perimenopausal disorder: Secondary | ICD-10-CM

## 2020-05-06 DIAGNOSIS — I480 Paroxysmal atrial fibrillation: Secondary | ICD-10-CM | POA: Diagnosis not present

## 2020-05-06 DIAGNOSIS — F411 Generalized anxiety disorder: Secondary | ICD-10-CM | POA: Diagnosis not present

## 2020-05-06 DIAGNOSIS — J4540 Moderate persistent asthma, uncomplicated: Secondary | ICD-10-CM | POA: Diagnosis not present

## 2020-05-06 DIAGNOSIS — F5105 Insomnia due to other mental disorder: Secondary | ICD-10-CM

## 2020-05-06 DIAGNOSIS — F321 Major depressive disorder, single episode, moderate: Secondary | ICD-10-CM | POA: Insufficient documentation

## 2020-05-06 MED ORDER — MIRTAZAPINE 7.5 MG PO TABS: 7.5000 mg | ORAL_TABLET | Freq: Every day | ORAL | 1 refills | Status: DC

## 2020-05-06 MED ORDER — ATORVASTATIN CALCIUM 10 MG PO TABS: 10.0000 mg | ORAL_TABLET | Freq: Every day | ORAL | 3 refills | Status: DC

## 2020-05-06 MED ORDER — AMLODIPINE BESYLATE 5 MG PO TABS: ORAL_TABLET | ORAL | 3 refills | Status: AC

## 2020-05-06 MED ORDER — MIRTAZAPINE 7.5 MG PO TABS: 7.5000 mg | ORAL_TABLET | Freq: Every day | ORAL | 3 refills | Status: DC

## 2020-05-06 MED ORDER — FLUTICASONE-SALMETEROL 250-50 MCG/DOSE IN AEPB: 1.0000 | INHALATION_SPRAY | Freq: Two times a day (BID) | RESPIRATORY_TRACT | 3 refills | Status: AC

## 2020-05-06 MED ORDER — FLUOXETINE HCL 40 MG PO CAPS: 40.0000 mg | ORAL_CAPSULE | Freq: Every day | ORAL | 1 refills | Status: AC

## 2020-05-06 MED ORDER — ALPRAZOLAM 0.25 MG PO TABS: ORAL_TABLET | ORAL | 1 refills | Status: AC

## 2020-05-06 MED ORDER — FLUTICASONE-SALMETEROL 250-50 MCG/DOSE IN AEPB: 1.0000 | INHALATION_SPRAY | Freq: Two times a day (BID) | RESPIRATORY_TRACT | 3 refills | Status: DC

## 2020-05-06 MED ORDER — FLUOXETINE HCL 40 MG PO CAPS: 40.0000 mg | ORAL_CAPSULE | Freq: Every day | ORAL | 1 refills | Status: DC

## 2020-05-06 MED ORDER — HYDRALAZINE HCL 25 MG PO TABS: 25.0000 mg | ORAL_TABLET | Freq: Every day | ORAL | 3 refills | Status: AC

## 2020-05-06 MED ORDER — SOTALOL HCL 80 MG PO TABS: 80.0000 mg | ORAL_TABLET | Freq: Every day | ORAL | 3 refills | Status: AC

## 2020-05-06 MED ORDER — ESTROGENS, CONJUGATED 0.625 MG/GM VA CREA
1.0000 | TOPICAL_CREAM | VAGINAL | 3 refills | Status: AC
Start: 2020-05-07 — End: ?

## 2020-05-06 MED ORDER — MONTELUKAST SODIUM 10 MG PO TABS: 10.0000 mg | ORAL_TABLET | Freq: Every day | ORAL | 3 refills | Status: AC

## 2020-05-06 MED ORDER — MONTELUKAST SODIUM 10 MG PO TABS: 10.0000 mg | ORAL_TABLET | Freq: Every day | ORAL | 3 refills | Status: DC

## 2020-05-06 MED ORDER — LOSARTAN POTASSIUM 50 MG PO TABS: 50.0000 mg | ORAL_TABLET | Freq: Every day | ORAL | 3 refills | Status: AC

## 2020-05-06 MED ORDER — ZOLPIDEM TARTRATE 5 MG PO TABS: ORAL_TABLET | ORAL | 1 refills | Status: AC

## 2020-05-06 NOTE — Progress Notes (Signed)
[facility] [place], [place] [address]  Internal MEDICINE  Office Visit Note  Patient Name: Michele Summers  [id number]  [id number]  Date of Service: 05/06/2020  Chief Complaint  Patient presents with  . Follow-up  . Depression  . Hyperlipidemia  . Hypertension  . controlled substance form    reviewed    The patient is here for follow up visit. She continues to suffer from anxiety and depression. Has had much increased stress over past few months. In process of weaning down dose ambien. Now on 5mg  at bedtime. Added low dose remeron at last visit. She is tolerating this well and sleeping ok. Continues to take alprazolam daily when needed. She does need to have refills of these medications today. PDMP profile was reviewed with the patient.  Had pacemaker check. Check was satisfactory. eliquis was added. Will continue this indefinitely.        Current Medication: Outpatient Encounter Medications as of 05/06/2020  Medication Sig  . albuterol (VENTOLIN HFA) 108 (90 Base) MCG/ACT inhaler INHALE 2 PUFFS INTO LUNGS EVERY 6 HOURS AS NEEDED FOR SHORTNESS OF BREATH  . albuterol-ipratropium (COMBIVENT) 18-103 MCG/ACT inhaler Inhale into the lungs every 4 (four) hours.  Marland Kitchen amLODipine (NORVASC) 5 MG tablet TAKE ONE TABLET BY MOUTH AT BEDTIME FOR HTN  . atorvastatin (LIPITOR) 10 MG tablet Take 1 tablet (10 mg total) by mouth daily. AM  . brimonidine-timolol (COMBIGAN) 0.2-0.5 % ophthalmic solution Place 1 drop into both eyes every 12 (twelve) hours.  . conjugated estrogens (PREMARIN) vaginal cream Place 1 Applicatorful vaginally 2 (two) times a week.  . Difluprednate 0.05 % EMUL Apply to eye.  Marland Kitchen erythromycin ophthalmic ointment Apply to sutures 4 times a day for 10-12 days.  Discontinue if allergy develops and call our office  . FLUoxetine (PROZAC) 40 MG capsule Take 1 capsule (40 mg total) by mouth daily. AM  . Fluticasone-Salmeterol (ADVAIR DISKUS) 250-50 MCG/DOSE  AEPB Inhale 1 puff into the lungs 2 (two) times daily.  . hydrALAZINE (APRESOLINE) 25 MG tablet Take 1 tablet (25 mg total) by mouth daily.  Marland Kitchen KRILL OIL PO Take by mouth.  . losartan (COZAAR) 50 MG tablet Take 1 tablet (50 mg total) by mouth daily.  . montelukast (SINGULAIR) 10 MG tablet Take 1 tablet (10 mg total) by mouth daily.  Marland Kitchen moxifloxacin (VIGAMOX) 0.5 % ophthalmic solution 1 drop 3 (three) times daily.  . Multiple Vitamins-Minerals (PRESERVISION AREDS PO) Take by mouth. AM  . nepafenac (ILEVRO) 0.3 % ophthalmic suspension 1 drop daily.  Marland Kitchen oxyCODONE-acetaminophen (PERCOCET) 5-325 MG tablet Take 1 tablet by mouth every 4 (four) hours as needed for severe pain.  . sotalol (BETAPACE) 80 MG tablet Take 1 tablet (80 mg total) by mouth daily.  . travoprost, benzalkonium, (TRAVATAN) 0.004 % ophthalmic solution 1 drop at bedtime.  . vitamin B-12 (CYANOCOBALAMIN) 500 MCG tablet Take 500 mcg by mouth daily.  . [DISCONTINUED] ALPRAZolam (XANAX) 0.25 MG tablet Take one tab a day prn for anxiety and panic attacks  . [DISCONTINUED] mirtazapine (REMERON) 7.5 MG tablet Take 1 tablet (7.5 mg total) by mouth at bedtime. For sleep  . [DISCONTINUED] zolpidem (AMBIEN) 5 MG tablet Take one tab po qhs for sleep  . ALPRAZolam (XANAX) 0.25 MG tablet Take one tab a day prn for anxiety and panic attacks  . apixaban (ELIQUIS) 2.5 MG TABS tablet Take 1 tablet (2.5 mg total) by mouth once for 1 dose.  . mirtazapine (REMERON) 7.5 MG  tablet Take 1 tablet (7.5 mg total) by mouth at bedtime. For sleep  . zolpidem (AMBIEN) 5 MG tablet Take one tab po qhs for sleep   No facility-administered encounter medications on file as of 05/06/2020.    Surgical History: Past Surgical History:  Procedure Laterality Date  . ABDOMINAL HYSTERECTOMY    . BLADDER SURGERY    . BRAIN SURGERY    . CARDIAC CATHETERIZATION    . CATARACT EXTRACTION W/PHACO Left 09/15/2014   Procedure: CATARACT EXTRACTION PHACO AND INTRAOCULAR LENS  PLACEMENT (IOC);  Surgeon: Ronnell Freshwater, MD;  Location: [place];  Service: Ophthalmology;  Laterality: Left;  . CATARACT EXTRACTION W/PHACO Right 03/23/2015   Procedure: CATARACT EXTRACTION PHACO AND INTRAOCULAR LENS PLACEMENT (IOC);  Surgeon: Ronnell Freshwater, MD;  Location: [place];  Service: Ophthalmology;  Laterality: Right;  . CHOLECYSTECTOMY    . CRANIOTOMY    . ECTROPION REPAIR Bilateral 02/28/2020   Procedure: REPAIR OF ECTROPION BILATERAL;  Surgeon: Karle Starch, MD;  Location: [place];  Service: Ophthalmology;  Laterality: Bilateral;  . INSERT / REPLACE / REMOVE PACEMAKER    . JOINT REPLACEMENT     TOTAL HIP    Medical History: Past Medical History:  Diagnosis Date  . Arthritis    Hands, feet, jaw  . Asthma   . CHF (congestive heart failure) ([place])   . Depression   . DVT (deep venous thrombosis) ([place])   . Dysrhythmia    A-FIB  . Fracture of hip ([place])    right  . Full dentures    UPPER AND LOWER  . Headache(784.0)   . Hypercholesterolemia   . Hypertension    CONTROLLED ON MEDS  . PE (pulmonary embolism)   . Presence of permanent cardiac pacemaker   . Stroke (cerebrum) (HCC)    DIFFICULT FOR HER TO COMPLETE HER SENTENCES,RESPONDS SLOWLY  . Stroke [facility]) 2006   CVA-CEREBRAL ANEURYSM  . UTI (urinary tract infection)    FREQUENT  . Wears dentures    full upper and lower    Family History: Family History  Problem Relation Age of Onset  . Asthma Mother   . Hypertension Mother   . Heart disease Father     Social History   Socioeconomic History  . Marital status: Married    Spouse name: Not on file  . Number of children: Not on file  . Years of education: Not on file  . Highest education level: Not on file  Occupational History  . Not on file  Tobacco Use  . Smoking status: Never Smoker  . Smokeless tobacco: Never Used  Vaping Use  . Vaping Use: Never used  Substance and Sexual Activity  .  Alcohol use: No  . Drug use: Never  . Sexual activity: Never  Other Topics Concern  . Not on file  Social History Narrative  . Not on file   Social Determinants of Health   Financial Resource Strain: Not on file  Food Insecurity: Not on file  Transportation Needs: Not on file  Physical Activity: Not on file  Stress: Not on file  Social Connections: Not on file  Intimate Partner Violence: Not on file      Review of Systems  Constitutional: Positive for fatigue. Negative for activity change, chills and unexpected weight change.  HENT: Negative for congestion, postnasal drip, rhinorrhea, sneezing and sore throat.   Respiratory: Negative for cough, chest tightness, shortness of breath and wheezing.   Cardiovascular: Negative  for chest pain and palpitations.       Patient has pacemaker.   Gastrointestinal: Negative for abdominal pain, constipation, diarrhea, nausea and vomiting.  Endocrine: Negative for cold intolerance, heat intolerance, polydipsia and polyuria.  Musculoskeletal: Positive for gait problem. Negative for arthralgias, back pain, joint swelling and neck pain.       Patient using a rolling walker to help with ambulation.  Skin: Negative for rash.  Allergic/Immunologic: Negative for environmental allergies.  Neurological: Positive for speech difficulty and weakness. Negative for dizziness, tremors, numbness and headaches.  Hematological: Negative for adenopathy. Does not bruise/bleed easily.  Psychiatric/Behavioral: Positive for dysphoric mood. Negative for behavioral problems (Depression), sleep disturbance and suicidal ideas. The patient is nervous/anxious.        Anxiety/depression/stable.     Today's Vitals   05/06/20 1140  BP: (!) 146/77  Pulse: 90  Resp: 16  Temp: (!) 97.5 F (36.4 C)  SpO2: 95%  Weight: 148 lb 9.6 oz (67.4 kg)  Height: 5\' 3"  (1.6 m)   Body mass index is 26.32 kg/m.  Physical Exam Vitals and nursing note reviewed.  Constitutional:       General: She is not in acute distress.    Appearance: Normal appearance. She is well-developed and well-nourished. She is not diaphoretic.  HENT:     Head: Normocephalic and atraumatic.     Mouth/Throat:     Mouth: Oropharynx is clear and moist.     Pharynx: No oropharyngeal exudate.  Eyes:     Extraocular Movements: EOM normal.     Pupils: Pupils are equal, round, and reactive to light.  Neck:     Thyroid: No thyromegaly.     Vascular: No carotid bruit or JVD.     Trachea: No tracheal deviation.  Cardiovascular:     Rate and Rhythm: Normal rate. Rhythm irregular.     Heart sounds: Murmur heard.  No friction rub. No gallop.   Pulmonary:     Effort: Pulmonary effort is normal. No respiratory distress.     Breath sounds: Normal breath sounds. No wheezing or rales.  Chest:     Chest wall: No tenderness.  Abdominal:     Palpations: Abdomen is soft.  Musculoskeletal:        General: Normal range of motion.     Cervical back: Normal range of motion and neck supple.  Lymphadenopathy:     Cervical: No cervical adenopathy.  Skin:    General: Skin is warm and dry.  Neurological:     Mental Status: She is alert and oriented to person, place, and time. Mental status is at baseline.     Cranial Nerves: No cranial nerve deficit.  Psychiatric:        Attention and Perception: Attention and perception normal.        Mood and Affect: Mood is anxious and depressed.        Speech: Speech normal.        Behavior: Behavior normal.        Thought Content: Thought content normal.        Cognition and Memory: Cognition and memory normal.        Judgment: Judgment normal.   Assessment/Plan: 1. Essential hypertension Stable. Continue bp medication as prescribed  2. Cardiac pacemaker in situ Recent pacemaker check satisfactory. eliquis added and will be continued.   3. Depression, major, single episode, moderate (HCC) Improved and stable. Continue remoeron 7.5mg  every evening.  -  mirtazapine (REMERON) 7.5 MG tablet;  Take 1 tablet (7.5 mg total) by mouth at bedtime. For sleep  Dispense: 30 tablet; Refill: 1  4. GAD (generalized anxiety disorder) May take alprazolam 0.25mg  every evening as needed. Sent new prescription to local [facility].  - ALPRAZolam (XANAX) 0.25 MG tablet; Take one tab a day prn for anxiety and panic attacks  Dispense: 30 tablet; Refill: 1  5. Insomnia due to anxiety and fear May continue zolpidem 5mg  at bedtime as needed. New prescription sent to her pharmacy.  - zolpidem (AMBIEN) 5 MG tablet; Take one tab po qhs for sleep  Dispense: 30 tablet; Refill: 1  General Counseling: Jerald verbalizes understanding of the findings of todays visit and agrees with plan of treatment. I have discussed any further diagnostic evaluation that may be needed or ordered today. We also reviewed her medications today. she has been encouraged to call the office with any questions or concerns that should arise related to todays visit.   This patient was seen by [place] with Dr [name] as a part of collaborative care agreement  Meds ordered this encounter  Medications  . ALPRAZolam (XANAX) 0.25 MG tablet    Sig: Take one tab a day prn for anxiety and panic attacks    Dispense:  30 tablet    Refill:  1    Order Specific Question:   Supervising Provider    Answer:   [name] [place]  . zolpidem (AMBIEN) 5 MG tablet    Sig: Take one tab po qhs for sleep    Dispense:  30 tablet    Refill:  1    Order Specific Question:   Supervising Provider    Answer:   [name] [place]  . mirtazapine (REMERON) 7.5 MG tablet    Sig: Take 1 tablet (7.5 mg total) by mouth at bedtime. For sleep    Dispense:  30 tablet    Refill:  1    Order Specific Question:   Supervising Provider    Answer:   [name] [id number]    Total time spent: 30 Minutes   Time spent includes review of chart, medications, test results, and follow up plan with the  patient.      Dr [name] Internal medicine

## 2020-05-25 DIAGNOSIS — H353211 Exudative age-related macular degeneration, right eye, with active choroidal neovascularization: Secondary | ICD-10-CM | POA: Diagnosis not present

## 2020-06-22 ENCOUNTER — Other Ambulatory Visit: Payer: Self-pay

## 2020-06-22 ENCOUNTER — Ambulatory Visit (INDEPENDENT_AMBULATORY_CARE_PROVIDER_SITE_OTHER): Payer: Medicare Other | Admitting: Internal Medicine

## 2020-06-22 VITALS — BP 132/84 | HR 86

## 2020-06-22 DIAGNOSIS — I10 Essential (primary) hypertension: Secondary | ICD-10-CM

## 2020-06-22 DIAGNOSIS — Z950 Presence of cardiac pacemaker: Secondary | ICD-10-CM

## 2020-06-22 DIAGNOSIS — I480 Paroxysmal atrial fibrillation: Secondary | ICD-10-CM

## 2020-06-22 DIAGNOSIS — Z96641 Presence of right artificial hip joint: Secondary | ICD-10-CM | POA: Diagnosis not present

## 2020-06-22 DIAGNOSIS — M25551 Pain in right hip: Secondary | ICD-10-CM | POA: Diagnosis not present

## 2020-06-27 NOTE — Assessment & Plan Note (Signed)
Patient takes Eliquis regularly.

## 2020-06-27 NOTE — Assessment & Plan Note (Signed)
Blood pressure is under control on present medication 

## 2020-06-27 NOTE — Progress Notes (Signed)
Established Patient Office Visit  Subjective:  Patient ID: Michele Summers, female    DOB: Mar 24, 1932  Age: 87 y.o. MRN: [id number]  CC: No chief complaint on file.   HPI  [name] presents for pacemaker check.  Patient is known to have atrial fibrillation unsteady gait also known to have asthma.  Patient is being followed by Dr. Chancy Milroy. Past Medical History:  Diagnosis Date   Arthritis    Hands, feet, jaw   Asthma    CHF (congestive heart failure) (HCC)    Depression    DVT (deep venous thrombosis) (HCC)    Dysrhythmia    A-FIB   Fracture of hip (HCC)    right   Full dentures    UPPER AND LOWER   Headache(784.0)    Hypercholesterolemia    Hypertension    CONTROLLED ON MEDS   PE (pulmonary embolism)    Presence of permanent cardiac pacemaker    Stroke (cerebrum) ([place])    DIFFICULT FOR HER TO COMPLETE HER SENTENCES,RESPONDS SLOWLY   Stroke ([place]) 2006   CVA-CEREBRAL ANEURYSM   UTI (urinary tract infection)    FREQUENT   Wears dentures    full upper and lower    Past Surgical History:  Procedure Laterality Date   ABDOMINAL HYSTERECTOMY     BLADDER SURGERY     BRAIN SURGERY     CARDIAC CATHETERIZATION     CATARACT EXTRACTION W/PHACO Left 09/15/2014   Procedure: CATARACT EXTRACTION PHACO AND INTRAOCULAR LENS PLACEMENT ([place]);  Surgeon: Ronnell Freshwater, MD;  Location: [facility];  Service: Ophthalmology;  Laterality: Left;   CATARACT EXTRACTION W/PHACO Right 03/23/2015   Procedure: CATARACT EXTRACTION PHACO AND INTRAOCULAR LENS PLACEMENT ([facility]);  Surgeon: Ronnell Freshwater, MD;  Location: [facility];  Service: Ophthalmology;  Laterality: Right;   CHOLECYSTECTOMY     CRANIOTOMY     ECTROPION REPAIR Bilateral 02/28/2020   Procedure: REPAIR OF ECTROPION BILATERAL;  Surgeon: Karle Starch, MD;  Location: [facility];  Service: Ophthalmology;  Laterality: Bilateral;   INSERT / REPLACE / REMOVE PACEMAKER      JOINT REPLACEMENT     TOTAL HIP    Family History  Problem Relation Age of Onset   Asthma Mother    Hypertension Mother    Heart disease Father     Social History   Socioeconomic History   Marital status: Married    Spouse name: Not on file   Number of children: Not on file   Years of education: Not on file   Highest education level: Not on file  Occupational History   Not on file  Tobacco Use   Smoking status: Never Smoker   Smokeless tobacco: Never Used  Vaping Use   Vaping Use: Never used  Substance and Sexual Activity   Alcohol use: No   Drug use: Never   Sexual activity: Never  Other Topics Concern   Not on file  Social History Narrative   Not on file   Social Determinants of Health   Financial Resource Strain: Not on file  Food Insecurity: Not on file  Transportation Needs: Not on file  Physical Activity: Not on file  Stress: Not on file  Social Connections: Not on file  Intimate Partner Violence: Not on file     Current Outpatient Medications:    albuterol (VENTOLIN HFA) 108 (90 Base) MCG/ACT inhaler, INHALE 2 PUFFS INTO LUNGS EVERY 6 HOURS AS NEEDED FOR SHORTNESS OF BREATH, Disp:  54 each, Rfl: 3   albuterol-ipratropium (COMBIVENT) 18-103 MCG/ACT inhaler, Inhale into the lungs every 4 (four) hours., Disp: , Rfl:    ALPRAZolam (XANAX) 0.25 MG tablet, Take one tab a day prn for anxiety and panic attacks, Disp: 30 tablet, Rfl: 1   amLODipine (NORVASC) 5 MG tablet, TAKE ONE TABLET BY MOUTH AT BEDTIME FOR HTN, Disp: 90 tablet, Rfl: 3   apixaban (ELIQUIS) 2.5 MG TABS tablet, Take 1 tablet (2.5 mg total) by mouth once for 1 dose., Disp: 60 tablet, Rfl: 1   atorvastatin (LIPITOR) 10 MG tablet, Take 1 tablet (10 mg total) by mouth daily. AM, Disp: 90 tablet, Rfl: 3   brimonidine-timolol (COMBIGAN) 0.2-0.5 % ophthalmic solution, Place 1 drop into both eyes every 12 (twelve) hours., Disp: , Rfl:    conjugated estrogens (PREMARIN) vaginal  cream, Place 1 Applicatorful vaginally 2 (two) times a week., Disp: 48 g, Rfl: 3   Difluprednate 0.05 % EMUL, Apply to eye., Disp: , Rfl:    erythromycin ophthalmic ointment, Apply to sutures 4 times a day for 10-12 days.  Discontinue if allergy develops and call our office, Disp: 3.5 g, Rfl: 2   FLUoxetine (PROZAC) 40 MG capsule, Take 1 capsule (40 mg total) by mouth daily. AM, Disp: 90 capsule, Rfl: 1   Fluticasone-Salmeterol (ADVAIR DISKUS) 250-50 MCG/DOSE AEPB, Inhale 1 puff into the lungs 2 (two) times daily., Disp: 3 each, Rfl: 3   hydrALAZINE (APRESOLINE) 25 MG tablet, Take 1 tablet (25 mg total) by mouth daily., Disp: 90 tablet, Rfl: 3   KRILL OIL PO, Take by mouth., Disp: , Rfl:    losartan (COZAAR) 50 MG tablet, Take 1 tablet (50 mg total) by mouth daily., Disp: 90 tablet, Rfl: 3   mirtazapine (REMERON) 7.5 MG tablet, Take 1 tablet (7.5 mg total) by mouth at bedtime. For sleep, Disp: 90 tablet, Rfl: 3   montelukast (SINGULAIR) 10 MG tablet, Take 1 tablet (10 mg total) by mouth daily., Disp: 90 tablet, Rfl: 3   moxifloxacin (VIGAMOX) 0.5 % ophthalmic solution, 1 drop 3 (three) times daily., Disp: , Rfl:    Multiple Vitamins-Minerals (PRESERVISION AREDS PO), Take by mouth. AM, Disp: , Rfl:    nepafenac (ILEVRO) 0.3 % ophthalmic suspension, 1 drop daily., Disp: , Rfl:    oxyCODONE-acetaminophen (PERCOCET) 5-325 MG tablet, Take 1 tablet by mouth every 4 (four) hours as needed for severe pain., Disp: 6 tablet, Rfl: 0   sotalol (BETAPACE) 80 MG tablet, Take 1 tablet (80 mg total) by mouth daily., Disp: 90 tablet, Rfl: 3   travoprost, benzalkonium, (TRAVATAN) 0.004 % ophthalmic solution, 1 drop at bedtime., Disp: , Rfl:    vitamin B-12 (CYANOCOBALAMIN) 500 MCG tablet, Take 500 mcg by mouth daily., Disp: , Rfl:    zolpidem (AMBIEN) 5 MG tablet, Take one tab po qhs for sleep, Disp: 30 tablet, Rfl: 1   Allergies  Allergen Reactions   Aminoglycosides    Bee Pollen    Sulfa  Antibiotics Itching   Tramadol     Makes her feel "crazy"   Morphine And Related Other (See Comments)    unknown    ROS Review of Systems  Constitutional: Negative.   HENT: Negative.   Eyes: Negative.   Respiratory: Negative.  Negative for cough, choking and chest tightness.   Cardiovascular: Negative.  Negative for palpitations.  Gastrointestinal: Negative.   Endocrine: Negative.   Genitourinary: Negative.   Musculoskeletal: Negative.   Skin: Negative.   Allergic/Immunologic: Negative.  Neurological: Negative.   Hematological: Negative.   Psychiatric/Behavioral: Negative.   All other systems reviewed and are negative.     Objective:    Physical Exam Vitals reviewed.  Constitutional:      Appearance: Normal appearance.  HENT:     Mouth/Throat:     Mouth: Mucous membranes are moist.  Eyes:     Pupils: Pupils are equal, round, and reactive to light.  Neck:     Vascular: No carotid bruit.  Cardiovascular:     Rate and Rhythm: Normal rate. Rhythm irregular.     Pulses: Normal pulses.     Heart sounds: Normal heart sounds.  Pulmonary:     Effort: Pulmonary effort is normal.     Breath sounds: Normal breath sounds.  Abdominal:     General: Bowel sounds are normal.     Palpations: Abdomen is soft. There is no hepatomegaly, splenomegaly or mass.     Tenderness: There is no abdominal tenderness.     Hernia: No hernia is present.  Musculoskeletal:        General: No tenderness.     Cervical back: Neck supple.     Right lower leg: No edema.     Left lower leg: No edema.  Skin:    Findings: No rash.  Neurological:     Mental Status: She is alert and oriented to person, place, and time.     Motor: No weakness.  Psychiatric:        Mood and Affect: Mood and affect normal.        Behavior: Behavior normal.     BP 132/84    Pulse 86  Wt Readings from Last 3 Encounters:  05/06/20 148 lb 9.6 oz (67.4 kg)  03/09/20 147 lb 9.6 oz (67 kg)  02/28/20 143 lb (64.9  kg)     Health Maintenance Due  Topic Date Due   COVID-19 Vaccine (1) Never done   TETANUS/TDAP  Never done    There are no preventive care reminders to display for this patient.  Lab Results  Component Value Date   TSH 2.356 06/14/2011   Lab Results  Component Value Date   WBC 6.2 07/17/2015   HGB 12.6 07/17/2015   HCT 37.8 07/17/2015   MCV 79.9 (L) 07/17/2015   PLT 301 07/17/2015   Lab Results  Component Value Date   NA 142 07/17/2015   K 4.0 07/17/2015   CO2 29 07/17/2015   GLUCOSE 105 (H) 07/17/2015   BUN 16 07/17/2015   CREATININE 0.75 07/17/2015   BILITOT 0.8 07/17/2015   ALKPHOS 72 07/17/2015   AST 18 07/17/2015   ALT 9 (L) 07/17/2015   PROT 6.7 07/17/2015   ALBUMIN 3.9 07/17/2015   CALCIUM 9.2 07/17/2015   ANIONGAP 5 07/17/2015   No results found for: CHOL No results found for: HDL No results found for: LDLCALC No results found for: TRIG No results found for: CHOLHDL Lab Results  Component Value Date   HGBA1C 5.5 06/14/2011      Assessment & Plan:   Problem List Items Addressed This Visit      Cardiovascular and Mediastinum   Essential hypertension    Blood pressure is under control on present medication.      Paroxysmal atrial fibrillation [facility])    Patient takes Eliquis regularly.        Other   Pacemaker - Primary    Pacemaker is functioning well.  Patient has paroxysmal atrial fibrillation noted.  Relevant Orders   PACEMAKER IN CLINIC CHECK (Completed)      No orders of the defined types were placed in this encounter.   Follow-up: No follow-ups on file.  Note: Medical Device Follow-up  Patient pacemaker was interrogated by pacemakers analyzer, battery status is okay.  No programming changes were indicated after the review of the data.  Histogram shows no change since the last interrogation Atrial and ventricular sensing thresholds were found to be acceptable Impedance was checked and it was found to be normal.   Thresholds were found to be okay on evaluation of rhythm problem.  No high rate or low rate arrhythmia were noted.  Estimated battery longevity is 24months. I have personally reviewed the device data and amended the report as necessary.    Cletis Athens, MD

## 2020-06-27 NOTE — Assessment & Plan Note (Signed)
Pacemaker is functioning well.  Patient has paroxysmal atrial fibrillation noted.

## 2020-06-30 ENCOUNTER — Other Ambulatory Visit: Payer: Self-pay

## 2020-06-30 DIAGNOSIS — F321 Major depressive disorder, single episode, moderate: Secondary | ICD-10-CM

## 2020-06-30 MED ORDER — MIRTAZAPINE 7.5 MG PO TABS: 7.5000 mg | ORAL_TABLET | Freq: Every day | ORAL | 0 refills | Status: DC

## 2020-07-01 ENCOUNTER — Other Ambulatory Visit: Payer: Self-pay

## 2020-07-01 ENCOUNTER — Inpatient Hospital Stay
Admission: EM | Admit: 2020-07-01 | Discharge: 2020-07-11 | DRG: 558 | Disposition: A | Discharge status: Skilled Nursing Facility | Payer: Medicare Other | Attending: Internal Medicine | Admitting: Internal Medicine

## 2020-07-01 ENCOUNTER — Other Ambulatory Visit: Payer: Self-pay | Admitting: Physician Assistant

## 2020-07-01 ENCOUNTER — Encounter: Payer: Self-pay | Admitting: Emergency Medicine

## 2020-07-01 ENCOUNTER — Ambulatory Visit: Payer: Medicare Other

## 2020-07-01 ENCOUNTER — Other Ambulatory Visit (HOSPITAL_COMMUNITY): Payer: Self-pay | Admitting: Physician Assistant

## 2020-07-01 ENCOUNTER — Emergency Department: Payer: Medicare Other

## 2020-07-01 DIAGNOSIS — D2121 Benign neoplasm of connective and other soft tissue of right lower limb, including hip: Secondary | ICD-10-CM | POA: Diagnosis not present

## 2020-07-01 DIAGNOSIS — Z882 Allergy status to sulfonamides status: Secondary | ICD-10-CM | POA: Diagnosis not present

## 2020-07-01 DIAGNOSIS — J4540 Moderate persistent asthma, uncomplicated: Secondary | ICD-10-CM | POA: Diagnosis present

## 2020-07-01 DIAGNOSIS — Z7951 Long term (current) use of inhaled steroids: Secondary | ICD-10-CM

## 2020-07-01 DIAGNOSIS — M728 Other fibroblastic disorders: Principal | ICD-10-CM | POA: Diagnosis present

## 2020-07-01 DIAGNOSIS — M60852 Other myositis, left thigh: Secondary | ICD-10-CM | POA: Diagnosis present

## 2020-07-01 DIAGNOSIS — Z8249 Family history of ischemic heart disease and other diseases of the circulatory system: Secondary | ICD-10-CM

## 2020-07-01 DIAGNOSIS — I82411 Acute embolism and thrombosis of right femoral vein: Secondary | ICD-10-CM | POA: Diagnosis not present

## 2020-07-01 DIAGNOSIS — R739 Hyperglycemia, unspecified: Secondary | ICD-10-CM | POA: Diagnosis present

## 2020-07-01 DIAGNOSIS — M7989 Other specified soft tissue disorders: Secondary | ICD-10-CM | POA: Diagnosis not present

## 2020-07-01 DIAGNOSIS — M729 Fibroblastic disorder, unspecified: Secondary | ICD-10-CM | POA: Diagnosis not present

## 2020-07-01 DIAGNOSIS — F410 Panic disorder [episodic paroxysmal anxiety] without agoraphobia: Secondary | ICD-10-CM | POA: Diagnosis not present

## 2020-07-01 DIAGNOSIS — K219 Gastro-esophageal reflux disease without esophagitis: Secondary | ICD-10-CM | POA: Diagnosis present

## 2020-07-01 DIAGNOSIS — Z20822 Contact with and (suspected) exposure to covid-19: Secondary | ICD-10-CM | POA: Diagnosis present

## 2020-07-01 DIAGNOSIS — F411 Generalized anxiety disorder: Secondary | ICD-10-CM | POA: Diagnosis present

## 2020-07-01 DIAGNOSIS — Z7901 Long term (current) use of anticoagulants: Secondary | ICD-10-CM

## 2020-07-01 DIAGNOSIS — E538 Deficiency of other specified B group vitamins: Secondary | ICD-10-CM | POA: Diagnosis not present

## 2020-07-01 DIAGNOSIS — I5032 Chronic diastolic (congestive) heart failure: Secondary | ICD-10-CM | POA: Diagnosis present

## 2020-07-01 DIAGNOSIS — Z90710 Acquired absence of both cervix and uterus: Secondary | ICD-10-CM | POA: Diagnosis not present

## 2020-07-01 DIAGNOSIS — Z885 Allergy status to narcotic agent status: Secondary | ICD-10-CM

## 2020-07-01 DIAGNOSIS — T8451XA Infection and inflammatory reaction due to internal right hip prosthesis, initial encounter: Secondary | ICD-10-CM | POA: Diagnosis not present

## 2020-07-01 DIAGNOSIS — M60851 Other myositis, right thigh: Secondary | ICD-10-CM | POA: Diagnosis not present

## 2020-07-01 DIAGNOSIS — I110 Hypertensive heart disease with heart failure: Secondary | ICD-10-CM | POA: Diagnosis present

## 2020-07-01 DIAGNOSIS — M159 Polyosteoarthritis, unspecified: Secondary | ICD-10-CM | POA: Diagnosis not present

## 2020-07-01 DIAGNOSIS — I959 Hypotension, unspecified: Secondary | ICD-10-CM | POA: Diagnosis not present

## 2020-07-01 DIAGNOSIS — Z8673 Personal history of transient ischemic attack (TIA), and cerebral infarction without residual deficits: Secondary | ICD-10-CM | POA: Diagnosis not present

## 2020-07-01 DIAGNOSIS — Z743 Need for continuous supervision: Secondary | ICD-10-CM | POA: Diagnosis not present

## 2020-07-01 DIAGNOSIS — M6080 Other myositis, unspecified site: Secondary | ICD-10-CM | POA: Diagnosis not present

## 2020-07-01 DIAGNOSIS — I10 Essential (primary) hypertension: Secondary | ICD-10-CM | POA: Diagnosis present

## 2020-07-01 DIAGNOSIS — Z79899 Other long term (current) drug therapy: Secondary | ICD-10-CM

## 2020-07-01 DIAGNOSIS — Z96641 Presence of right artificial hip joint: Secondary | ICD-10-CM | POA: Diagnosis present

## 2020-07-01 DIAGNOSIS — I495 Sick sinus syndrome: Secondary | ICD-10-CM | POA: Diagnosis present

## 2020-07-01 DIAGNOSIS — M869 Osteomyelitis, unspecified: Secondary | ICD-10-CM

## 2020-07-01 DIAGNOSIS — R700 Elevated erythrocyte sedimentation rate: Secondary | ICD-10-CM | POA: Diagnosis present

## 2020-07-01 DIAGNOSIS — Z888 Allergy status to other drugs, medicaments and biological substances status: Secondary | ICD-10-CM

## 2020-07-01 DIAGNOSIS — E440 Moderate protein-calorie malnutrition: Secondary | ICD-10-CM | POA: Diagnosis present

## 2020-07-01 DIAGNOSIS — Z950 Presence of cardiac pacemaker: Secondary | ICD-10-CM

## 2020-07-01 DIAGNOSIS — Z8781 Personal history of (healed) traumatic fracture: Secondary | ICD-10-CM | POA: Diagnosis not present

## 2020-07-01 DIAGNOSIS — R609 Edema, unspecified: Secondary | ICD-10-CM

## 2020-07-01 DIAGNOSIS — E7800 Pure hypercholesterolemia, unspecified: Secondary | ICD-10-CM | POA: Diagnosis present

## 2020-07-01 DIAGNOSIS — M25572 Pain in left ankle and joints of left foot: Secondary | ICD-10-CM | POA: Diagnosis not present

## 2020-07-01 DIAGNOSIS — R2241 Localized swelling, mass and lump, right lower limb: Secondary | ICD-10-CM | POA: Diagnosis not present

## 2020-07-01 DIAGNOSIS — M25551 Pain in right hip: Secondary | ICD-10-CM

## 2020-07-01 DIAGNOSIS — R410 Disorientation, unspecified: Secondary | ICD-10-CM | POA: Diagnosis not present

## 2020-07-01 DIAGNOSIS — Z6825 Body mass index (BMI) 25.0-25.9, adult: Secondary | ICD-10-CM

## 2020-07-01 DIAGNOSIS — Z825 Family history of asthma and other chronic lower respiratory diseases: Secondary | ICD-10-CM

## 2020-07-01 DIAGNOSIS — Z86711 Personal history of pulmonary embolism: Secondary | ICD-10-CM

## 2020-07-01 DIAGNOSIS — H40053 Ocular hypertension, bilateral: Secondary | ICD-10-CM | POA: Diagnosis not present

## 2020-07-01 DIAGNOSIS — I480 Paroxysmal atrial fibrillation: Secondary | ICD-10-CM | POA: Diagnosis present

## 2020-07-01 DIAGNOSIS — D649 Anemia, unspecified: Secondary | ICD-10-CM | POA: Diagnosis present

## 2020-07-01 DIAGNOSIS — M79651 Pain in right thigh: Secondary | ICD-10-CM | POA: Diagnosis not present

## 2020-07-01 DIAGNOSIS — Z79890 Hormone replacement therapy (postmenopausal): Secondary | ICD-10-CM

## 2020-07-01 DIAGNOSIS — F32A Depression, unspecified: Secondary | ICD-10-CM | POA: Diagnosis not present

## 2020-07-01 DIAGNOSIS — R279 Unspecified lack of coordination: Secondary | ICD-10-CM | POA: Diagnosis not present

## 2020-07-01 DIAGNOSIS — M62838 Other muscle spasm: Secondary | ICD-10-CM | POA: Diagnosis not present

## 2020-07-01 DIAGNOSIS — G4700 Insomnia, unspecified: Secondary | ICD-10-CM | POA: Diagnosis not present

## 2020-07-01 LAB — BASIC METABOLIC PANEL
Anion gap: 10 (ref 5–15)
BUN: 16 mg/dL (ref 8–23)
CO2: 22 mmol/L (ref 22–32)
Calcium: 8.8 mg/dL — ABNORMAL LOW (ref 8.9–10.3)
Chloride: 104 mmol/L (ref 98–111)
Creatinine, Ser: 0.8 mg/dL (ref 0.44–1.00)
GFR, Estimated: >60 mL/min (ref >60)
Glucose, Bld: 244 mg/dL — ABNORMAL HIGH (ref 70–99)
Potassium: 4.3 mmol/L (ref 3.5–5.1)
Sodium: 136 mmol/L (ref 135–145)

## 2020-07-01 LAB — CBC
HCT: 34.8 % — ABNORMAL LOW (ref 36.0–46.0)
Hemoglobin: 11 g/dL — ABNORMAL LOW (ref 12.0–15.0)
MCH: 26.2 pg (ref 26.0–34.0)
MCHC: 31.6 g/dL (ref 30.0–36.0)
MCV: 82.9 fL (ref 80.0–100.0)
Platelets: 350 10*3/uL (ref 150–400)
RBC: 4.2 MIL/uL (ref 3.87–5.11)
RDW: 13.7 % (ref 11.5–15.5)
WBC: 5.2 10*3/uL (ref 4.0–10.5)
nRBC: 0 % (ref 0.0–0.2)

## 2020-07-01 LAB — RESP PANEL BY RT-PCR (FLU A&B, COVID) ARPGX2
Influenza A by PCR: NEGATIVE
Influenza B by PCR: NEGATIVE
SARS Coronavirus 2 by RT PCR: NEGATIVE

## 2020-07-01 LAB — MAGNESIUM: Magnesium: 1.9 mg/dL (ref 1.7–2.4)

## 2020-07-01 MED ORDER — ONDANSETRON HCL 4 MG/2ML IJ SOLN: 4.0000 mg | Freq: Four times a day (QID) | INTRAMUSCULAR | Status: DC | PRN

## 2020-07-01 MED ORDER — BRIMONIDINE TARTRATE-TIMOLOL 0.2-0.5 % OP SOLN: 1.0000 [drp] | Freq: Two times a day (BID) | OPHTHALMIC | Status: DC

## 2020-07-01 MED ORDER — HYDROCODONE-ACETAMINOPHEN 5-325 MG PO TABS
1.0000 | ORAL_TABLET | Freq: Once | ORAL | Status: AC
  Administered 2020-07-01: 1 via ORAL
  Filled 2020-07-01: qty 1

## 2020-07-01 MED ORDER — TIMOLOL MALEATE 0.5 % OP SOLN
1.0000 [drp] | Freq: Two times a day (BID) | OPHTHALMIC | Status: DC
  Administered 2020-07-02 – 2020-07-11 (×19): 1 [drp] via OPHTHALMIC
  Filled 2020-07-01: qty 5

## 2020-07-01 MED ORDER — NALOXONE HCL 0.4 MG/ML IJ SOLN: 0.4000 mg | INTRAMUSCULAR | Status: DC | PRN

## 2020-07-01 MED ORDER — NEPAFENAC 0.1 % OP SUSP
1.0000 [drp] | Freq: Every day | OPHTHALMIC | Status: DC
  Administered 2020-07-02 – 2020-07-10 (×10): 1 [drp] via OPHTHALMIC
  Filled 2020-07-01 (×2): qty 3

## 2020-07-01 MED ORDER — FLUTICASONE FUROATE-VILANTEROL 200-25 MCG/INH IN AEPB
1.0000 | INHALATION_SPRAY | Freq: Every day | RESPIRATORY_TRACT | Status: DC
  Administered 2020-07-02 – 2020-07-11 (×10): 1 via RESPIRATORY_TRACT
  Filled 2020-07-01: qty 28

## 2020-07-01 MED ORDER — PREDNISONE 20 MG PO TABS
60.0000 mg | ORAL_TABLET | Freq: Once | ORAL | Status: AC
  Administered 2020-07-01: 60 mg via ORAL
  Filled 2020-07-01: qty 3

## 2020-07-01 MED ORDER — ALPRAZOLAM 0.25 MG PO TABS
0.2500 mg | ORAL_TABLET | Freq: Every evening | ORAL | Status: DC | PRN
  Administered 2020-07-01 – 2020-07-10 (×5): 0.25 mg via ORAL
  Filled 2020-07-01 (×5): qty 1

## 2020-07-01 MED ORDER — ZOLPIDEM TARTRATE 5 MG PO TABS
5.0000 mg | ORAL_TABLET | Freq: Every evening | ORAL | Status: DC | PRN
  Administered 2020-07-01 – 2020-07-10 (×6): 5 mg via ORAL
  Filled 2020-07-01 (×6): qty 1

## 2020-07-01 MED ORDER — LIDOCAINE 5 % EX PTCH
1.0000 | MEDICATED_PATCH | CUTANEOUS | Status: DC
  Administered 2020-07-01 – 2020-07-11 (×10): 1 via TRANSDERMAL
  Filled 2020-07-01 (×11): qty 1

## 2020-07-01 MED ORDER — KETOROLAC TROMETHAMINE 15 MG/ML IJ SOLN
15.0000 mg | Freq: Four times a day (QID) | INTRAMUSCULAR | Status: AC | PRN
  Administered 2020-07-02 – 2020-07-03 (×3): 15 mg via INTRAVENOUS
  Filled 2020-07-01 (×3): qty 1

## 2020-07-01 MED ORDER — NETARSUDIL-LATANOPROST 0.02-0.005 % OP SOLN
1.0000 [drp] | Freq: Every evening | OPHTHALMIC | Status: DC
  Administered 2020-07-04 – 2020-07-09 (×7): 1 [drp] via OPHTHALMIC
  Filled 2020-07-01: qty 2.5

## 2020-07-01 MED ORDER — HYDROCODONE-ACETAMINOPHEN 5-325 MG PO TABS: 1.0000 | ORAL_TABLET | Freq: Four times a day (QID) | ORAL | Status: DC | PRN

## 2020-07-01 MED ORDER — FLUOXETINE HCL 20 MG PO CAPS
40.0000 mg | ORAL_CAPSULE | Freq: Every day | ORAL | Status: DC
  Administered 2020-07-02 – 2020-07-11 (×10): 40 mg via ORAL
  Filled 2020-07-01 (×11): qty 2

## 2020-07-01 MED ORDER — MOXIFLOXACIN HCL 0.5 % OP SOLN
1.0000 [drp] | Freq: Three times a day (TID) | OPHTHALMIC | Status: DC
  Administered 2020-07-02 – 2020-07-11 (×28): 1 [drp] via OPHTHALMIC
  Filled 2020-07-01: qty 3

## 2020-07-01 MED ORDER — SOTALOL HCL 80 MG PO TABS
80.0000 mg | ORAL_TABLET | Freq: Every day | ORAL | Status: DC
  Administered 2020-07-02 – 2020-07-11 (×10): 80 mg via ORAL
  Filled 2020-07-01 (×11): qty 1

## 2020-07-01 MED ORDER — BRIMONIDINE TARTRATE 0.2 % OP SOLN
1.0000 [drp] | Freq: Two times a day (BID) | OPHTHALMIC | Status: DC
  Administered 2020-07-02 – 2020-07-11 (×19): 1 [drp] via OPHTHALMIC
  Filled 2020-07-01: qty 5

## 2020-07-01 MED ORDER — MONTELUKAST SODIUM 10 MG PO TABS
10.0000 mg | ORAL_TABLET | Freq: Every day | ORAL | Status: DC
  Administered 2020-07-02 – 2020-07-11 (×10): 10 mg via ORAL
  Filled 2020-07-01 (×10): qty 1

## 2020-07-01 MED ORDER — HYDROCODONE-ACETAMINOPHEN 5-325 MG PO TABS
1.0000 | ORAL_TABLET | ORAL | Status: DC | PRN
  Administered 2020-07-01 – 2020-07-06 (×15): 1 via ORAL
  Filled 2020-07-01 (×16): qty 1

## 2020-07-01 NOTE — ED Triage Notes (Signed)
Pt comes into the ED via [facility] from home c/o right hip pain x 2-3 weeks.  Pt has been getting seen by [facility] with multiple x-rays and pain medication, but the pain is now unmanageable.  Pt has not ambulated for EMS, but per family she is able to stand and pivot at home.  Pt has VS WNL for EMS.  No medication given at this time.

## 2020-07-01 NOTE — ED Notes (Signed)
Pt was room to evaluate pt.  Family with pt.

## 2020-07-01 NOTE — ED Provider Notes (Signed)
[facility] Emergency Department Provider Note ____________________________________________  Time seen: 1245  I have reviewed the triage vital signs and the nursing notes.  HISTORY  Chief Complaint  Hip Pain and Pain Management   HPI Michele Summers is a 87 y.o. female presents to the ER today with complaint of acute right hip pain.  She reports this started 1 to 2 weeks ago.  She describes the pain as achy, pressure and grinding.  The pain does not radiate.  She denies numbness, tingling or weakness to her right lower extremity.  She denies low back pain.  She has a history of a THR on the right in 2013.  She denies recent fall or injury to the area.  She has seen [facility] for the same on 2/11.  Her daughter reports she was given Tramadol, Tylenol with Codeine with minimal relief of symptoms.  She reports Tylenol and Ibuprofen taken together will take the edge off.  X-ray was done but results are not available to review in epic.  Her daughter brings her to the ER today for persistent pain and difficulty with ambulation.  Past Medical History:  Diagnosis Date  . Arthritis    Hands, feet, jaw  . Asthma   . CHF (congestive heart failure) ([place])   . Depression   . DVT (deep venous thrombosis) ([place])   . Dysrhythmia    A-FIB  . Fracture of hip ([place])    right  . Full dentures    UPPER AND LOWER  . Headache(784.0)   . Hypercholesterolemia   . Hypertension    CONTROLLED ON MEDS  . PE (pulmonary embolism)   . Presence of permanent cardiac pacemaker   . Stroke (cerebrum) ([facility])    DIFFICULT FOR HER TO COMPLETE HER SENTENCES,RESPONDS SLOWLY  . Stroke [facility]) 2006   CVA-CEREBRAL ANEURYSM  . UTI (urinary tract infection)    FREQUENT  . Wears dentures    full upper and lower    Patient Active Problem List   Diagnosis Date Noted  . Acute right hip pain 07/01/2020  . Depression, major, single episode, moderate ([place]) 05/06/2020  . Paroxysmal atrial fibrillation  ([place]) 03/22/2020  . Pacemaker 03/22/2020  . Encounter for long-term (current) use of medications 03/01/2020  . Complicated grief 05/39/7673  . Mixed hyperlipidemia 04/14/2018  . Sprain of wrist 04/10/2018  . Encounter for general adult medical examination with abnormal findings 12/22/2017  . GAD (generalized anxiety disorder) 12/22/2017  . Need for vaccination against Streptococcus pneumoniae using pneumococcal conjugate vaccine 13 12/22/2017  . Dysuria 10/04/2017  . Insomnia due to anxiety and fear 10/04/2017  . Unspecified menopausal and perimenopausal disorder 10/04/2017  . Essential hypertension 06/25/2017  . Acute bronchitis 06/13/2017  . Altered mental status 06/14/2011  . Pulmonary embolism ([place]) 06/14/2011  . Respiratory failure with hypoxia ([place]) 06/14/2011  . Pneumonia 06/14/2011  . Acute renal failure ([place]) 06/14/2011  . Femur fracture, right ([place]) 06/14/2011  . Moderate intermittent asthma without complication 41/93/7902  . Urinary tract infection with hematuria 06/14/2011    Past Surgical History:  Procedure Laterality Date  . ABDOMINAL HYSTERECTOMY    . BLADDER SURGERY    . BRAIN SURGERY    . CARDIAC CATHETERIZATION    . CATARACT EXTRACTION W/PHACO Left 09/15/2014   Procedure: CATARACT EXTRACTION PHACO AND INTRAOCULAR LENS PLACEMENT ([facility]);  Surgeon: Ronnell Freshwater, MD;  Location: [facility];  Service: Ophthalmology;  Laterality: Left;  . CATARACT EXTRACTION W/PHACO Right 03/23/2015  Procedure: CATARACT EXTRACTION PHACO AND INTRAOCULAR LENS PLACEMENT ([facility]);  Surgeon: Ronnell Freshwater, MD;  Location: [facility];  Service: Ophthalmology;  Laterality: Right;  . CHOLECYSTECTOMY    . CRANIOTOMY    . ECTROPION REPAIR Bilateral 02/28/2020   Procedure: REPAIR OF ECTROPION BILATERAL;  Surgeon: Karle Starch, MD;  Location: [facility];  Service: Ophthalmology;  Laterality: Bilateral;  . INSERT / REPLACE / REMOVE PACEMAKER    . JOINT  REPLACEMENT     TOTAL HIP    Prior to Admission medications   Medication Sig Start Date End Date Taking? Authorizing Provider  albuterol (VENTOLIN HFA) 108 (90 Base) MCG/ACT inhaler INHALE 2 PUFFS INTO LUNGS EVERY 6 HOURS AS NEEDED FOR SHORTNESS OF BREATH 03/03/20   [name], MD  albuterol-ipratropium [facility]) [phone] MCG/ACT inhaler Inhale into the lungs every 4 (four) hours.    [provider]  ALPRAZolam Duanne Moron) 0.25 MG tablet Take one tab a day prn for anxiety and panic attacks 05/06/20   Ronnell Freshwater, NP  amLODipine (NORVASC) 5 MG tablet TAKE ONE TABLET BY MOUTH AT BEDTIME FOR HTN 05/06/20   Ronnell Freshwater, NP  apixaban (ELIQUIS) 2.5 MG TABS tablet Take 1 tablet (2.5 mg total) by mouth once for 1 dose. 03/19/20 03/19/20  Cletis Athens, MD  atorvastatin (LIPITOR) 10 MG tablet Take 1 tablet (10 mg total) by mouth daily. AM 05/06/20   Ronnell Freshwater, NP  brimonidine-timolol (COMBIGAN) 0.2-0.5 % ophthalmic solution Place 1 drop into both eyes every 12 (twelve) hours.    [provider]  conjugated estrogens (PREMARIN) vaginal cream Place 1 Applicatorful vaginally 2 (two) times a week. 05/07/20   Ronnell Freshwater, NP  Difluprednate 0.05 % EMUL Apply to eye.    [provider]  erythromycin ophthalmic ointment Apply to sutures 4 times a day for 10-12 days.  Discontinue if allergy develops and call our office 02/28/20   Karle Starch, MD  FLUoxetine (PROZAC) 40 MG capsule Take 1 capsule (40 mg total) by mouth daily. AM 05/06/20   Ronnell Freshwater, NP  Fluticasone-Salmeterol (ADVAIR DISKUS) 250-50 MCG/DOSE AEPB Inhale 1 puff into the lungs 2 (two) times daily. 05/06/20   Ronnell Freshwater, NP  hydrALAZINE (APRESOLINE) 25 MG tablet Take 1 tablet (25 mg total) by mouth daily. 05/06/20   Ronnell Freshwater, NP  KRILL OIL PO Take by mouth.    [provider]  losartan (COZAAR) 50 MG tablet Take 1 tablet (50 mg total) by mouth daily. 05/06/20   Ronnell Freshwater, NP  mirtazapine (REMERON) 7.5 MG tablet Take 1 tablet (7.5 mg total) by mouth at bedtime. For sleep 06/30/20   [name], MD  montelukast (SINGULAIR) 10 MG tablet Take 1 tablet (10 mg total) by mouth daily. 05/06/20   Ronnell Freshwater, NP  moxifloxacin (VIGAMOX) 0.5 % ophthalmic solution 1 drop 3 (three) times daily.    [provider]  Multiple Vitamins-Minerals (PRESERVISION AREDS PO) Take by mouth. AM    [provider]  nepafenac (ILEVRO) 0.3 % ophthalmic suspension 1 drop daily.    [provider]  oxyCODONE-acetaminophen (PERCOCET) 5-325 MG tablet Take 1 tablet by mouth every 4 (four) hours as needed for severe pain. 02/28/20   Karle Starch, MD  sotalol (BETAPACE) 80 MG tablet Take 1 tablet (80 mg total) by mouth daily. 05/06/20   Ronnell Freshwater, NP  travoprost, benzalkonium, (TRAVATAN) 0.004 % ophthalmic solution 1 drop  at bedtime.    [provider]  vitamin B-12 (CYANOCOBALAMIN) 500 MCG tablet Take 500 mcg by mouth daily.    [provider]  zolpidem (AMBIEN) 5 MG tablet Take one tab po qhs for sleep 05/06/20   Ronnell Freshwater, NP    Allergies Aminoglycosides, Bee pollen, Sulfa antibiotics, Tramadol, and Morphine and related  Family History  Problem Relation Age of Onset  . Asthma Mother   . Hypertension Mother   . Heart disease Father     Social History Social History   Tobacco Use  . Smoking status: Never Smoker  . Smokeless tobacco: Never Used  Vaping Use  . Vaping Use: Never used  Substance Use Topics  . Alcohol use: No  . Drug use: Never    Review of Systems  Constitutional: Negative for fever, chills or body aches. Cardiovascular: Negative for chest pain or chest tightness. Respiratory: Negative for cough or shortness of breath. Musculoskeletal: Positive for right hip pain, difficulty with gait.  Negative for back, knee or ankle pain. Neurological: Positive for weakness of the right lower  extremity.  Negative for tingling or numbness. ____________________________________________  PHYSICAL EXAM:  VITAL SIGNS: ED Triage Vitals  Enc Vitals Group     BP 07/01/20 1221 (!) 161/57     Pulse Rate 07/01/20 1221 (!) 114     Resp 07/01/20 1221 16     Temp 07/01/20 1221 97.7 F (36.5 C)     Temp Source 07/01/20 1221 Oral     SpO2 07/01/20 1221 97 %     Weight 07/01/20 1219 140 lb (63.5 kg)     Height 07/01/20 1219 5\' 5"  (1.651 m)     Head Circumference --      Peak Flow --      Pain Score 07/01/20 1218 3     Pain Loc --      Pain Edu? --      Excl. in [place]? --     Constitutional: Alert and oriented.  Appears in pain but in no distress. Head: Normocephalic and atraumatic. Cardiovascular: Tachycardic, regular rhythm.  Pedal pulse 2+ on the right. Respiratory: Normal respiratory effort. No wheezes/rales/rhonchi. Musculoskeletal: Unable to assess range of motion of the spine as patient has difficulty bearing weight.  Normal adduction of the right hip.  Pain with abduction of the right hip.  Pain with internal rotation of the right hip.  Decreased passive external rotation of the right hip.  Pain with palpation over the trochanter. She is able to get herself in and out of the bed. She is able to ambulate slowly with use of walker and assistance. Strength 4/5 RLE, 5/5 LLE. Neurologic: Normal speech and language.  Skin:  Skin is warm, dry and intact.  ____________________________________________   RADIOLOGY   Imaging Orders     CT Hip Right Wo Contrast IMPRESSION: No acute abnormality. Right hip arthroplasty results in fairly extensive streak artifact on the examination, particularly about the proximal femur. Recommend correlation with plain films of the hip if there is concern for a periprosthetic proximal femur fracture. ____________________________________________   INITIAL IMPRESSION / ASSESSMENT AND PLAN / ED COURSE  Acute Right Hip Pain, previous THR:  CT right hip  today shows no acute misalignment, dislocation, fracture Prednisone 60 mg PO x 1, pain level 6 from 8 Hydrocodone 5-325 mg PO x 1, pain level 4 from 6 Lidoderm patch applied to right hip Daughter wanting her admitted for pain control, hard to justify as  pain is improving Daughter request evaluation for SNF placement for rehab- PT eval and social work consult ordered Discussed with Dr. [name]- agrees with admission for pain control, they will round on the patient in the morning. Toe touch RLE until then. PT came to eval pt- recommends SNF placement for rehab Hospitalist consulted- will admit for pain control    I reviewed the patient's prescription history over the last 12 months in the multi-state controlled substances database(s) that includes [place], [place], [place], [place], [place], [place], [place], [place], [place], [place], [place], [place], [place], and [place].  Results were notable for Tramadol and Tylenol #3 provided by [facility]. ____________________________________________  FINAL CLINICAL IMPRESSION(S) / ED DIAGNOSES  Final diagnoses:  Right hip pain      Jearld Fenton, NP 07/01/20 1738    Lavonia Drafts, MD 07/04/20 [phone]

## 2020-07-01 NOTE — Plan of Care (Signed)
  Problem: Education: Goal: Knowledge of General Education information will improve Description Including pain rating scale, medication(s)/side effects and non-pharmacologic comfort measures Outcome: Progressing   Problem: Clinical Measurements: Goal: Ability to maintain clinical measurements within normal limits will improve Outcome: Progressing   Problem: Activity: Goal: Risk for activity intolerance will decrease Outcome: Progressing   

## 2020-07-01 NOTE — Evaluation (Signed)
Physical Therapy Evaluation Patient Details Name: Michele Summers MRN: [id number] DOB: January 31, 1932 Today's Date: 07/01/2020   History of Present Illness  Pt is an 87 yo female arrived at the ED with R hip pain and difficulty walking.  Per pt and daughter present at bedside the pt had imaging done at [facility] over a weak ago with no signs of fracture.  Per patient's daughter they were told the pain was likely from bursitis.  PMH includes R THA 2013, CHF, PE, and CVA.    Clinical Impression  Pt was pleasant and motivated to participate during the session but ultimately was very limited functionally.  Pt required physical assistance with bed mobility and ambulation and required an elevated surface in order to stand.  Pt could only ambulate a max of 8 feet with heavy lean on the RW for support and with limited RLE SLS time.  Pt has limited assistance at home and would be at a very high risk for falls and/or functional decline if she were to return to her prior living situation.  Pt will benefit from PT services in a SNF setting upon discharge to safely address deficits listed in patient problem list for decreased caregiver assistance and eventual return to PLOF.      Follow Up Recommendations SNF;Supervision for mobility/OOB    Equipment Recommendations  Rolling walker with 5" wheels;3in1 (PT)    Recommendations for Other Services       Precautions / Restrictions Precautions Precautions: Fall Restrictions Weight Bearing Restrictions: No      Mobility  Bed Mobility Overal bed mobility: Needs Assistance Bed Mobility: Supine to Sit;Sit to Supine     Supine to sit: Min assist Sit to supine: Min assist   General bed mobility comments: Min A for RLE control    Transfers Overall transfer level: Needs assistance Equipment used: Rolling walker (2 wheeled) Transfers: Sit to/from Stand Sit to Stand: From elevated surface;Min guard         General transfer comment: Min verbal cues  for hand placement with pt able to stand without physical assistance but only from elevated surface with extra time and effort  Ambulation/Gait Ambulation/Gait assistance: Min assist Gait Distance (Feet): 8 Feet Assistive device: Rolling walker (2 wheeled) Gait Pattern/deviations: Step-to pattern;Antalgic;Decreased stance time - right;Decreased step length - left Gait velocity: decreased   General Gait Details: Pt only able to amb a max of 8 feet with min A to guide the RW and with very effortful, slow, antalgic gait pattern  Stairs            Wheelchair Mobility    Modified Rankin (Stroke Patients Only)       Balance Overall balance assessment: Needs assistance   Sitting balance-Leahy Scale: Good       Standing balance-Leahy Scale: Fair Standing balance comment: Heavy lean on the RW for support                             Pertinent Vitals/Pain Pain Assessment: 0-10 Pain Score: 3  Pain Location: R hip Pain Descriptors / Indicators: Sore;Aching Pain Intervention(s): Premedicated before session;Monitored during session    Home Living Family/patient expects to be discharged to:: Private residence Living Arrangements: Alone Available Help at Discharge: Family;Available 24 hours/day Type of Home: Mobile home Home Access: Ramped entrance     Home Layout: One level Home Equipment: [place] - 4 wheels;Shower seat      Prior Function Level  of Independence: Independent with assistive device(s)         Comments: Mod Ind amb with a rollator community distances, no fall history, Ind with ADLs     Hand Dominance        Extremity/Trunk Assessment   Upper Extremity Assessment Upper Extremity Assessment: Overall WFL for tasks assessed    Lower Extremity Assessment Lower Extremity Assessment: Generalized weakness;RLE deficits/detail RLE: Unable to fully assess due to pain       Communication   Communication: No difficulties  Cognition  Arousal/Alertness: Awake/alert Behavior During Therapy: WFL for tasks assessed/performed Overall Cognitive Status: Within Functional Limits for tasks assessed                                        General Comments      Exercises     Assessment/Plan    PT Assessment Patient needs continued PT services  PT Problem List Decreased strength;Decreased activity tolerance;Decreased balance;Decreased mobility;Decreased knowledge of use of DME;Pain       PT Treatment Interventions DME instruction;Gait training;Functional mobility training;Therapeutic activities;Therapeutic exercise;Balance training;Patient/family education    PT Goals (Current goals can be found in the Care Plan section)  Acute Rehab PT Goals Patient Stated Goal: To walk without pain PT Goal Formulation: With patient Time For Goal Achievement: 07/14/20 Potential to Achieve Goals: Good    Frequency Min 2X/week   Barriers to discharge Inaccessible home environment;Decreased caregiver support      Co-evaluation               AM-PAC PT "6 Clicks" Mobility  Outcome Measure Help needed turning from your back to your side while in a flat bed without using bedrails?: A Little Help needed moving from lying on your back to sitting on the side of a flat bed without using bedrails?: A Little Help needed moving to and from a bed to a chair (including a wheelchair)?: A Little Help needed standing up from a chair using your arms (e.g., wheelchair or bedside chair)?: A Little Help needed to walk in hospital room?: A Lot Help needed climbing 3-5 steps with a railing? : Total 6 Click Score: 15    End of Session Equipment Utilized During Treatment: Gait belt Activity Tolerance: Patient limited by fatigue Patient left: in bed;with call bell/phone within reach;with family/visitor present Nurse Communication: Mobility status PT Visit Diagnosis: Difficulty in walking, not elsewhere classified (R26.2);Muscle  weakness (generalized) (M62.81);Pain Pain - Right/Left: Right Pain - part of body: Hip    Time: 1648-1710 PT Time Calculation (min) (ACUTE ONLY): 22 min   Charges:   PT Evaluation $PT Eval Moderate Complexity: 1 Mod         D. Scott Doretha Goding PT, DPT 07/01/20, 5:36 PM

## 2020-07-01 NOTE — ED Notes (Signed)
Pt taken to CT.

## 2020-07-01 NOTE — ED Notes (Signed)
Ambulating in room with walker  Tolerated well  Was able to get out of bed and stand by herself

## 2020-07-01 NOTE — H&P (Signed)
History and Physical    PLEASE NOTE THAT DRAGON DICTATION SOFTWARE WAS USED IN THE CONSTRUCTION OF THIS NOTE.   Michele Summers PYK:[id number] DOB: November 12, 1931 DOA: 07/01/2020  PCP: [name], MD Patient coming from: home   I have personally briefly reviewed patient's old medical records in [place]  Chief Complaint: Right hip pain  HPI: Michele Summers is a 86 y.o. female with medical history significant for right hip fracture in 2013 status post right total hip arthroplasty, paroxysmal atrial fibrillation complicated by sick sinus syndrome status post pacemaker placement, hypertension, chronic diastolic heart failure, generalized anxiety disorder, moderate persistent asthma, who is admitted to [facility] on 07/01/2020 with acute right hip pain after presenting to [facility] ED complaining of such.   The patient reports 2 weeks of progressive right hip pain that she reports started with recent preceding trauma.  She reports that the hip pain constant in nature over the last 2 weeks, worsening with attempts to ambulate or to put weight on the right lower extremity.  The pain is sharp and nonradiating in nature.  Denies any associated numbness or paresthesias.  Denies any overt acute weakness associated with the right lower extremity, but reports that activity involving the right lower extremity has been limited by suboptimal pain control over that timeframe.  Denies associated erythema over the right hip.   She confirms a history of right hip fracture back in 2013, at which time she underwent total right hip arthroplasty without any reported ensuing complications from this procedure.  Over the last 2 weeks following the onset of right hip pain, the patient is followed with [facility], reportedly CT the right hip without evidence of acute process to explain her acute right hip pain, including no evidence of dislocation or fracture involving the right hip prosthesis.  She was prescribed as needed  Tylenol 3 over that timeframe, but noted no significant improvement in the associated intensity of her pain.   The patient lives by herself, and at baseline is able to independently perform her ADLs and ambulate without assistance.  However, in the setting of poor pain control relating to her right hip pain over the last 2 weeks, she has noted need for progressive assistance with ADLs as well as with ambulation.  She has a daughter that lives locally, but is unable to provide the level of care the patient now requires in the setting of progression of the right hip discomfort.   Denies any subjective fever, chills, rigors, or generalized myalgias. Denies any recent headache, neck stiffness, rhinitis, rhinorrhea, sore throat, sob, wheezing, cough, nausea, vomiting, abdominal pain, diarrhea, or rash. No recent traveling or known COVID-19 exposures. Denies dysuria, gross hematuria, or change in urinary urgency/frequency.  Denies any recent chest pain, diaphoresis, or palpitations.     ED Course:  Vital signs in the ED were notable for the following: Temperature max 98.6, heart rate 61-90; blood pressure initially noted to be 161/57, with decreased to 115/57 first dose of as needed Norco in the ED; respiratory rate 16-18, oxygen saturation 97 to 98% on room air.  Labs were notable for the following: (no labs performed in the ED today).   The emergency department provider discussed the patient's case with the on-call orthopedic surgeon, Dr. [name], who recommended admission to the hospital service for pain control, with plan for Dr. [name] to formally consult/evaluate the patient in the morning.   While in the ED, the following were administered: 5% lidocaine patch  transdermally applied x1 1515; Norco 5/325 mg p.o. x1.    Review of Systems: As per HPI otherwise 10 point review of systems negative.   Past Medical History:  Diagnosis Date  . Arthritis    Hands, feet, jaw  . Asthma   . CHF  (congestive heart failure) ([place])   . Depression   . DVT (deep venous thrombosis) ([place])   . Dysrhythmia    A-FIB  . Fracture of hip ([place])    right  . Full dentures    UPPER AND LOWER  . Headache(784.0)   . Hypercholesterolemia   . Hypertension    CONTROLLED ON MEDS  . PE (pulmonary embolism)   . Presence of permanent cardiac pacemaker   . Stroke (cerebrum) (HCC)    DIFFICULT FOR HER TO COMPLETE HER SENTENCES,RESPONDS SLOWLY  . Stroke [facility]) 2006   CVA-CEREBRAL ANEURYSM  . UTI (urinary tract infection)    FREQUENT  . Wears dentures    full upper and lower    Past Surgical History:  Procedure Laterality Date  . ABDOMINAL HYSTERECTOMY    . BLADDER SURGERY    . BRAIN SURGERY    . CARDIAC CATHETERIZATION    . CATARACT EXTRACTION W/PHACO Left 09/15/2014   Procedure: CATARACT EXTRACTION PHACO AND INTRAOCULAR LENS PLACEMENT (IOC);  Surgeon: Ronnell Freshwater, MD;  Location: [place];  Service: Ophthalmology;  Laterality: Left;  . CATARACT EXTRACTION W/PHACO Right 03/23/2015   Procedure: CATARACT EXTRACTION PHACO AND INTRAOCULAR LENS PLACEMENT (IOC);  Surgeon: Ronnell Freshwater, MD;  Location: [place];  Service: Ophthalmology;  Laterality: Right;  . CHOLECYSTECTOMY    . CRANIOTOMY    . ECTROPION REPAIR Bilateral 02/28/2020   Procedure: REPAIR OF ECTROPION BILATERAL;  Surgeon: Karle Starch, MD;  Location: [place];  Service: Ophthalmology;  Laterality: Bilateral;  . INSERT / REPLACE / REMOVE PACEMAKER    . JOINT REPLACEMENT     TOTAL HIP    Social History:  reports that she has never smoked. She has never used smokeless tobacco. She reports that she does not drink alcohol and does not use drugs.   Allergies  Allergen Reactions  . Aminoglycosides   . Bee Pollen   . Sulfa Antibiotics Itching  . Tramadol     Makes her feel "crazy"  . Morphine And Related Other (See Comments)    unknown    Family History  Problem Relation Age  of Onset  . Asthma Mother   . Hypertension Mother   . Heart disease Father      Prior to Admission medications   Medication Sig Start Date End Date Taking? Authorizing Provider  ALPRAZolam Duanne Moron) 0.25 MG tablet Take one tab a day prn for anxiety and panic attacks 05/06/20  Yes Boscia, Heather E, NP  amLODipine (NORVASC) 5 MG tablet TAKE ONE TABLET BY MOUTH AT BEDTIME FOR HTN Patient taking differently: Take 5 mg by mouth daily. TAKE ONE TABLET BY MOUTH AT BEDTIME FOR HTN 05/06/20  Yes Boscia, Heather E, NP  apixaban (ELIQUIS) 2.5 MG TABS tablet Take 1 tablet (2.5 mg total) by mouth once for 1 dose. 03/19/20 03/19/20 Yes Masoud, Viann Shove, MD  brimonidine-timolol (COMBIGAN) 0.2-0.5 % ophthalmic solution Place 1 drop into both eyes every 12 (twelve) hours.   Yes [provider]  conjugated estrogens (PREMARIN) vaginal cream Place 1 Applicatorful vaginally 2 (two) times a week. 05/07/20  Yes Boscia, Greer Ee, NP  FLUoxetine (PROZAC) 40 MG capsule Take 1 capsule (40  mg total) by mouth daily. AM 05/06/20  Yes Ronnell Freshwater, NP  hydrALAZINE (APRESOLINE) 25 MG tablet Take 1 tablet (25 mg total) by mouth daily. 05/06/20  Yes Boscia, Heather E, NP  KRILL OIL PO Take by mouth.   Yes [provider]  losartan (COZAAR) 50 MG tablet Take 1 tablet (50 mg total) by mouth daily. 05/06/20  Yes Boscia, Heather E, NP  montelukast (SINGULAIR) 10 MG tablet Take 1 tablet (10 mg total) by mouth daily. 05/06/20  Yes Boscia, Greer Ee, NP  moxifloxacin (VIGAMOX) 0.5 % ophthalmic solution 1 drop 3 (three) times daily.   Yes [provider]  Multiple Vitamins-Minerals (PRESERVISION AREDS PO) Take by mouth. AM   Yes [provider]  nepafenac (ILEVRO) 0.3 % ophthalmic suspension 1 drop daily.   Yes [provider]  ROCKLATAN 0.02-0.005 % SOLN Place 1 drop into both eyes every evening. 05/25/20  Yes [provider]  sotalol (BETAPACE) 80 MG tablet Take 1 tablet (80 mg  total) by mouth daily. 05/06/20  Yes Boscia, Heather E, NP  travoprost, benzalkonium, (TRAVATAN) 0.004 % ophthalmic solution 1 drop at bedtime.   Yes [provider]  vitamin B-12 (CYANOCOBALAMIN) 500 MCG tablet Take 500 mcg by mouth daily.   Yes [provider]  zolpidem (AMBIEN) 5 MG tablet Take one tab po qhs for sleep 05/06/20  Yes Boscia, Heather E, NP  acetaminophen-codeine (TYLENOL #3) 300-30 MG tablet Take 1 tablet by mouth every 8 (eight) hours. 06/22/20   [provider]  albuterol (VENTOLIN HFA) 108 (90 Base) MCG/ACT inhaler INHALE 2 PUFFS INTO LUNGS EVERY 6 HOURS AS NEEDED FOR SHORTNESS OF BREATH Patient taking differently: Inhale 2 puffs into the lungs every 6 (six) hours as needed for shortness of breath. 03/03/20   [name], MD  albuterol-ipratropium [facility]) [phone] MCG/ACT inhaler Inhale into the lungs every 4 (four) hours. Patient not taking: Reported on 07/01/2020    [provider]  erythromycin ophthalmic ointment Apply to sutures 4 times a day for 10-12 days.  Discontinue if allergy develops and call our office 02/28/20   Karle Starch, MD  Fluticasone-Salmeterol (ADVAIR DISKUS) 250-50 MCG/DOSE AEPB Inhale 1 puff into the lungs 2 (two) times daily. 05/06/20   Ronnell Freshwater, NP  oxyCODONE-acetaminophen (PERCOCET) 5-325 MG tablet Take 1 tablet by mouth every 4 (four) hours as needed for severe pain. Patient not taking: No sig reported 02/28/20   Karle Starch, MD     Objective    Physical Exam: Vitals:   07/01/20 1219 07/01/20 1221 07/01/20 1449 07/01/20 1809  BP:  (!) 161/57 (!) 158/78 (!) 142/55  Pulse:  (!) 114 92 90  Resp:  16 16 18   Temp:  97.7 F (36.5 C)  98 F (36.7 C)  TempSrc:  Oral  Oral  SpO2:  97% 98% 99%  Weight: 63.5 kg     Height: 5\' 5"  (1.651 m)       General: appears to be stated age; alert, oriented Skin: warm, dry, no rash Head:  AT/[place] Mouth:  Oral mucosa membranes appear moist, normal  dentition Neck: supple; trachea midline Heart:  RRR; did not appreciate any M/R/G Lungs: CTAB, did not appreciate any wheezes, rales, or rhonchi Abdomen: + BS; soft, ND, NT Vascular: 2+ pedal pulses b/l; 2+ radial pulses b/l Extremities: no peripheral edema, no muscle wasting Neuro: sensation intact in upper and lower extremities b/l; unable to assessment of strength of the right lower  extremity in the setting of current degree of pain control.    Labs on Admission: I have personally reviewed following labs and imaging studies  CBC: No results for input(s): WBC, NEUTROABS, HGB, HCT, MCV, PLT in the last 168 hours. Basic Metabolic Panel: No results for input(s): NA, K, CL, CO2, GLUCOSE, BUN, CREATININE, CALCIUM, MG, PHOS in the last 168 hours. GFR: CrCl cannot be calculated (Patient's most recent lab result is older than the maximum 21 days allowed.). Liver Function Tests: No results for input(s): AST, ALT, ALKPHOS, BILITOT, PROT, ALBUMIN in the last 168 hours. No results for input(s): LIPASE, AMYLASE in the last 168 hours. No results for input(s): AMMONIA in the last 168 hours. Coagulation Profile: No results for input(s): INR, PROTIME in the last 168 hours. Cardiac Enzymes: No results for input(s): CKTOTAL, CKMB, CKMBINDEX, TROPONINI in the last 168 hours. BNP (last 3 results) No results for input(s): PROBNP in the last 8760 hours. HbA1C: No results for input(s): HGBA1C in the last 72 hours. CBG: No results for input(s): GLUCAP in the last 168 hours. Lipid Profile: No results for input(s): CHOL, HDL, LDLCALC, TRIG, CHOLHDL, LDLDIRECT in the last 72 hours. Thyroid Function Tests: No results for input(s): TSH, T4TOTAL, FREET4, T3FREE, THYROIDAB in the last 72 hours. Anemia Panel: No results for input(s): VITAMINB12, FOLATE, FERRITIN, TIBC, IRON, RETICCTPCT in the last 72 hours. Urine analysis:    Component Value Date/Time   COLORURINE YELLOW (A) 07/17/2015 1326    APPEARANCEUR Clear 02/11/2020 1124   LABSPEC 1.012 07/17/2015 1326   LABSPEC 1.019 07/29/2013 1427   PHURINE 5.0 07/17/2015 1326   GLUCOSEU Negative 02/11/2020 1124   GLUCOSEU Negative 07/29/2013 1427   HGBUR NEGATIVE 07/17/2015 1326   BILIRUBINUR Negative 02/11/2020 1124   BILIRUBINUR Negative 07/29/2013 1427   KETONESUR NEGATIVE 07/17/2015 1326   PROTEINUR Trace 02/11/2020 1124   PROTEINUR NEGATIVE 07/17/2015 1326   UROBILINOGEN 0.2 07/15/2019 1551   UROBILINOGEN 1.0 06/21/2011 1000   NITRITE Negative 02/11/2020 1124   NITRITE POSITIVE (A) 07/17/2015 1326   LEUKOCYTESUR Negative 02/11/2020 1124   LEUKOCYTESUR 3+ 07/29/2013 1427    Radiological Exams on Admission: CT Hip Right Wo Contrast  Result Date: 07/01/2020 CLINICAL DATA:  Worsening right hip pain over the past 2-3 weeks. History of prior right hip arthroplasty. No known injury. EXAM: CT OF THE RIGHT HIP WITHOUT CONTRAST TECHNIQUE: Multidetector CT imaging of the right hip was performed according to the standard protocol. Multiplanar CT image reconstructions were also generated. COMPARISON:  Plain films right femur 11/02/2014. FINDINGS: Bones/Joint/Cartilage Right total hip arthroplasty is in place and results in streak artifact on the study which limits evaluation, particularly of the proximal femur. The device is located. No evidence of loosening or other hardware complication is identified. No focal bone lesion is seen. Ligaments Suboptimally assessed by CT. Muscles and Tendons Intact and normal in appearance. Soft tissues Atherosclerotic vascular disease noted. The patient is status post hysterectomy. IMPRESSION: No acute abnormality. Right hip arthroplasty results in fairly extensive streak artifact on the examination, particularly about the proximal femur. Recommend correlation with plain films of the hip if there is concern for a periprosthetic proximal femur fracture. Electronically Signed   By: Inge Rise M.D.   On:  07/01/2020 13:23     Assessment/Plan   Michele Summers is a 87 y.o. female with medical history significant for right hip fracture in 2013 status post right total hip arthroplasty, paroxysmal atrial fibrillation complicated by sick sinus syndrome status post  pacemaker placement, hypertension, chronic diastolic heart failure, generalized anxiety disorder, moderate persistent asthma, who is admitted to [facility] on 07/01/2020 with acute right hip pain after presenting to [facility] ED complaining of such.    Principal Problem:   Acute right hip pain Active Problems:   Moderate persistent asthma   Essential hypertension   GAD (generalized anxiety disorder)   Paroxysmal atrial fibrillation (HCC)   Chronic diastolic CHF (congestive heart failure) ([place])   #) Acute right hip pain: Weeks of progressive right hip pain with associated decline in ability to perform ADLs as well as concomitant increase in assistance with transfer as result of progressive decline in pain control over that time, in the absence of any overt preceding trauma.  This is in the context of a history of right hip fracture in 2013 status post right hip arthroplasty at that time.  As a result of decline in ambulatory activity as result of this pain control, patient's daughter does not feel that she is able to provide the appropriate level of care for the patient at home at this time.  Presenting CT right hip shows no evidence of acute abnormality, but notes extensive streak artifact relating to the prosthesis, limiting evaluation evaluation proximal femur fracture, with associated recommendation for further evaluation of the implant, the right hip.  Case discussed with the on-call orthopedic surgeon, Dr. [name] , Who recommends admission to the hospital service for further evaluation management of acute right hip pain.  Dr. [name] will formally consult, and plans to see the patient in the morning (07/02/20).  Right lower extremity is neurovascularly  intact, although assessment of strength is limited by associated pain control at this time.   Plan: As needed Norco.  As needed Toradol.  Close monitoring to renal function, with add-on BMP and repeat BMP tomorrow AM.  Orthopedic surgery formally consulted, with plan to evaluate the patient in the morning, as above.  Have ordered plain films of the right hip, as above.  As needed Narcan ordered.  Physical therapy consult placed.      #) Chronic diastolic failure: Documented history of such, with most recent echocardiogram in January 2013 showing normal left ventricular cavity size, LVEF 60 to 65%, no evidence of focal wall motion abnormalities, grade 1 diastolic dysfunction, mild to moderately dilated left atrium, and no evidence of significant valvular pathology.  Not on any chronic diuretic therapy as an outpatient.  No clinical evidence of acute decompensated heart failure at this time.  Plan: Monitor strict I's and O's daily weights.  Check BMP and serum magnesium level.      #) Paroxysmal atrial fibrillation: Documented history of such. In the setting of a CHA2DS2-VASc score of 6, there is an indication for the patient to be on chronic anticoagulation for thromboembolic prophylaxis. It is currently unclear if the patient remains on Eliquis. I have consulted inpatient pharmacy for assistance with clarification of this.  On sotalol as an outpatient.  Most recent echocardiogram from 2013, as further described above, including evidence of mild to moderate dilation of the left atrium in the absence of any associated significant valvular pathology.  Plan: Inpatient pharmacy consult for clarification on outpatient Eliquis.  Continue home sotalol.  Check BMP and serum magnesium level.      #) Essential hypertension: Outpatient hypertensive regimen includes Norvasc, hydralazine, losartan.  While presenting blood pressure was noted to be 161/57, sitting blood pressure trended down to 115/57  following interval as needed Norco administration, as above.  Consequently,  we will proceed conservatively with resumption of home antihypertensives so as to avoid hypotension.   Plan: Hold home antihypertensive medications for now, as above.  Close monitoring of ensuing blood pressure via routine VS.      #) Generalized anxiety disorder: On Prozac as well as as needed Xanax at home.  Plan: Continue SSRI as well as as needed Xanax.     #) Moderate persistent asthma: Outpatient respiratory regimen consists of scheduled Advair, Singulair.  No clinical evidence to suggest acute exacerbation.   Plan: Continue home Advair and Singulair.  As needed albuterol inhaler.  Add on serum magnesium level.     DVT prophylaxis: scd's  Code Status: Full code Family Communication: Case was discussed with the patient's daughter, who was present at bedside. Disposition Plan: Per Rounding Team Consults called: case was discusssed with the on-call orthopedic surgeon, Dr. [name], who will formally consult, as further described above Admission status: Inpatient; MedSurg.   Of note, this patient was added by me to the following Admit List/Treatment Team:  armcadmits     PLEASE NOTE THAT DRAGON DICTATION SOFTWARE WAS USED IN THE CONSTRUCTION OF THIS NOTE.   [facility] Pager [phone] From 12PM - 12AM  Otherwise, please contact night-coverage  [email or web address] Password [facility]   07/01/2020, 6:55 PM

## 2020-07-01 NOTE — ED Notes (Signed)
See triage note   Presents to ED with daughter  States she is having right hip pain   Pain started couple of weeks ago  Has been seen by Ortho  Placed on some pain meds   States she is still having pain   Denies any recent injury  No shortening or rotation noted    Pt states she does get some relief with 2 tylenol and 2 IBU OTC

## 2020-07-02 ENCOUNTER — Inpatient Hospital Stay: Payer: Medicare Other

## 2020-07-02 ENCOUNTER — Encounter: Payer: Self-pay | Admitting: Internal Medicine

## 2020-07-02 DIAGNOSIS — I10 Essential (primary) hypertension: Secondary | ICD-10-CM | POA: Diagnosis not present

## 2020-07-02 DIAGNOSIS — D649 Anemia, unspecified: Secondary | ICD-10-CM

## 2020-07-02 DIAGNOSIS — R739 Hyperglycemia, unspecified: Secondary | ICD-10-CM

## 2020-07-02 DIAGNOSIS — Z950 Presence of cardiac pacemaker: Secondary | ICD-10-CM

## 2020-07-02 DIAGNOSIS — M25551 Pain in right hip: Secondary | ICD-10-CM | POA: Diagnosis not present

## 2020-07-02 DIAGNOSIS — M869 Osteomyelitis, unspecified: Secondary | ICD-10-CM

## 2020-07-02 DIAGNOSIS — I5032 Chronic diastolic (congestive) heart failure: Secondary | ICD-10-CM | POA: Diagnosis present

## 2020-07-02 DIAGNOSIS — I480 Paroxysmal atrial fibrillation: Secondary | ICD-10-CM

## 2020-07-02 DIAGNOSIS — E440 Moderate protein-calorie malnutrition: Secondary | ICD-10-CM | POA: Insufficient documentation

## 2020-07-02 LAB — CBC
HCT: 33.4 % — ABNORMAL LOW (ref 36.0–46.0)
Hemoglobin: 10.7 g/dL — ABNORMAL LOW (ref 12.0–15.0)
MCH: 26.4 pg (ref 26.0–34.0)
MCHC: 32 g/dL (ref 30.0–36.0)
MCV: 82.5 fL (ref 80.0–100.0)
Platelets: 328 10*3/uL (ref 150–400)
RBC: 4.05 MIL/uL (ref 3.87–5.11)
RDW: 13.6 % (ref 11.5–15.5)
WBC: 6.5 10*3/uL (ref 4.0–10.5)
nRBC: 0 % (ref 0.0–0.2)

## 2020-07-02 LAB — SEDIMENTATION RATE: Sed Rate: 40 mm/hr — ABNORMAL HIGH (ref 0–30)

## 2020-07-02 LAB — BASIC METABOLIC PANEL
Anion gap: 8 (ref 5–15)
BUN: 18 mg/dL (ref 8–23)
CO2: 24 mmol/L (ref 22–32)
Calcium: 8.9 mg/dL (ref 8.9–10.3)
Chloride: 104 mmol/L (ref 98–111)
Creatinine, Ser: 0.92 mg/dL (ref 0.44–1.00)
GFR, Estimated: >60 mL/min (ref >60)
Glucose, Bld: 204 mg/dL — ABNORMAL HIGH (ref 70–99)
Potassium: 4 mmol/L (ref 3.5–5.1)
Sodium: 136 mmol/L (ref 135–145)

## 2020-07-02 LAB — C-REACTIVE PROTEIN: CRP: 3 mg/dL — ABNORMAL HIGH (ref <1.0)

## 2020-07-02 LAB — MAGNESIUM: Magnesium: 1.9 mg/dL (ref 1.7–2.4)

## 2020-07-02 MED ORDER — ENSURE ENLIVE PO LIQD
237.0000 mL | Freq: Two times a day (BID) | ORAL | Status: DC
  Administered 2020-07-03 – 2020-07-08 (×8): 237 mL via ORAL

## 2020-07-02 MED ORDER — ALBUTEROL SULFATE (2.5 MG/3ML) 0.083% IN NEBU: 2.5000 mg | INHALATION_SOLUTION | RESPIRATORY_TRACT | Status: DC | PRN

## 2020-07-02 MED ORDER — ALPRAZOLAM 0.25 MG PO TABS
0.2500 mg | ORAL_TABLET | Freq: Once | ORAL | Status: AC
  Administered 2020-07-02: 0.25 mg via ORAL
  Filled 2020-07-02: qty 1

## 2020-07-02 MED ORDER — ADULT MULTIVITAMIN W/MINERALS CH
1.0000 | ORAL_TABLET | Freq: Every day | ORAL | Status: DC
  Administered 2020-07-03 – 2020-07-11 (×9): 1 via ORAL
  Filled 2020-07-02 (×9): qty 1

## 2020-07-02 NOTE — Progress Notes (Addendum)
PROGRESS NOTE    Michele Summers  ZDG:[id number] DOB: [birth year] DOA: 07/01/2020 PCP: [name], MD    Brief Narrative:  Michele Summers is a 87 y.o. female with medical history significant for right hip fracture in 2013 status post right total hip arthroplasty, paroxysmal atrial fibrillation complicated by sick sinus syndrome status post pacemaker placement, hypertension, chronic diastolic heart failure, generalized anxiety disorder, moderate persistent asthma, who is admitted to [facility] on 07/01/2020 with acute right hip pain after presenting to [facility] ED complaining of right hip pain.  She confirms a history of right hip fracture back in 2013, at which time she underwent total right hip arthroplasty without any reported ensuing complications from this procedure  Addendum: 2/17-Per Dr. [name] via messaging, need to have IR aspirate rt hip for gram stain and culture r/o infection as xray suggestive.  Consultants:   orthopedics  Procedures:   Antimicrobials:       Subjective: C/o rt hip pain this am while eating breakfast. No sob or cp  Objective: Vitals:   07/02/20 0112 07/02/20 0443 07/02/20 0519 07/02/20 0527  BP: (!) 106/48  (!) 129/55 (!) 119/52  Pulse: 65  68 78  Resp: 15  15 16   Temp: 98 F (36.7 C)  (!) 97.5 F (36.4 C) 97.9 F (36.6 C)  TempSrc:      SpO2: 97%  96% 100%  Weight:  67 kg    Height:       No intake or output data in the 24 hours ending 07/02/20 0806 Filed Weights   07/01/20 1219 07/01/20 2353 07/02/20 0443  Weight: 63.5 kg 66.4 kg 67 kg    Examination:  General exam: Appears calm and comfortable  Respiratory system: Clear to auscultation. Respiratory effort normal. Cardiovascular system: S1 & S2 heard, RRR. No JVD, murmurs, rubs, gallops or clicks.  Gastrointestinal system: Abdomen is nondistended, soft and nontender.  Normal bowel sounds heard. Central nervous system: Alert and oriented.grossly intact Extremities: no edema Skin: warm,  dry Psychiatry: Judgement and insight appear normal. Mood & affect appropriate.     Data Reviewed: I have personally reviewed following labs and imaging studies  CBC: Recent Labs  Lab 07/01/20 1857 07/02/20 0417  WBC 5.2 6.5  HGB 11.0* 10.7*  HCT 34.8* 33.4*  MCV 82.9 82.5  PLT 350 595   Basic Metabolic Panel: Recent Labs  Lab 07/01/20 1857 07/02/20 0417  NA 136 136  K 4.3 4.0  CL 104 104  CO2 22 24  GLUCOSE 244* 204*  BUN 16 18  CREATININE 0.80 0.92  CALCIUM 8.8* 8.9  MG 1.9 1.9   GFR: Estimated Creatinine Clearance: 41.3 mL/min (by C-G formula based on SCr of 0.92 mg/dL). Liver Function Tests: No results for input(s): AST, ALT, ALKPHOS, BILITOT, PROT, ALBUMIN in the last 168 hours. No results for input(s): LIPASE, AMYLASE in the last 168 hours. No results for input(s): AMMONIA in the last 168 hours. Coagulation Profile: No results for input(s): INR, PROTIME in the last 168 hours. Cardiac Enzymes: No results for input(s): CKTOTAL, CKMB, CKMBINDEX, TROPONINI in the last 168 hours. BNP (last 3 results) No results for input(s): PROBNP in the last 8760 hours. HbA1C: No results for input(s): HGBA1C in the last 72 hours. CBG: No results for input(s): GLUCAP in the last 168 hours. Lipid Profile: No results for input(s): CHOL, HDL, LDLCALC, TRIG, CHOLHDL, LDLDIRECT in the last 72 hours. Thyroid Function Tests: No results for input(s): TSH, T4TOTAL, FREET4, T3FREE, THYROIDAB in  the last 72 hours. Anemia Panel: No results for input(s): VITAMINB12, FOLATE, FERRITIN, TIBC, IRON, RETICCTPCT in the last 72 hours. Sepsis Labs: No results for input(s): PROCALCITON, LATICACIDVEN in the last 168 hours.  Recent Results (from the past 240 hour(s))  Resp Panel by RT-PCR (Flu A&B, Covid) Nasopharyngeal Swab     Status: None   Collection Time: 07/01/20  4:45 PM   Specimen: Nasopharyngeal Swab; Nasopharyngeal(NP) swabs in vial transport medium  Result Value Ref Range Status    SARS Coronavirus 2 by RT PCR NEGATIVE NEGATIVE Final    Comment: (NOTE) SARS-CoV-2 target nucleic acids are NOT DETECTED.  The SARS-CoV-2 RNA is generally detectable in upper respiratory specimens during the acute phase of infection. The lowest concentration of SARS-CoV-2 viral copies this assay can detect is 138 copies/mL. A negative result does not preclude SARS-Cov-2 infection and should not be used as the sole basis for treatment or other patient management decisions. A negative result may occur with  improper specimen collection/handling, submission of specimen other than nasopharyngeal swab, presence of viral mutation(s) within the areas targeted by this assay, and inadequate number of viral copies(<138 copies/mL). A negative result must be combined with clinical observations, patient history, and epidemiological information. The expected result is Negative.  Fact Sheet for Patients:  EntrepreneurPulse.com.au  Fact Sheet for Healthcare Providers:  IncredibleEmployment.be  This test is no t yet approved or cleared by the [place] FDA and  has been authorized for detection and/or diagnosis of SARS-CoV-2 by FDA under an Emergency Use Authorization (EUA). This EUA will remain  in effect (meaning this test can be used) for the duration of the COVID-19 declaration under Section 564(b)(1) of the Act, 21 U.S.C.section 360bbb-3(b)(1), unless the authorization is terminated  or revoked sooner.       Influenza A by PCR NEGATIVE NEGATIVE Final   Influenza B by PCR NEGATIVE NEGATIVE Final    Comment: (NOTE) The Xpert Xpress SARS-CoV-2/FLU/RSV plus assay is intended as an aid in the diagnosis of influenza from Nasopharyngeal swab specimens and should not be used as a sole basis for treatment. Nasal washings and aspirates are unacceptable for Xpert Xpress SARS-CoV-2/FLU/RSV testing.  Fact Sheet for  Patients: EntrepreneurPulse.com.au  Fact Sheet for Healthcare Providers: IncredibleEmployment.be  This test is not yet approved or cleared by the [place] FDA and has been authorized for detection and/or diagnosis of SARS-CoV-2 by FDA under an Emergency Use Authorization (EUA). This EUA will remain in effect (meaning this test can be used) for the duration of the COVID-19 declaration under Section 564(b)(1) of the Act, 21 U.S.C. section 360bbb-3(b)(1), unless the authorization is terminated or revoked.  Performed at [facility], [address], [place], [place] [address]          Radiology Studies: CT Hip Right Wo Contrast  Result Date: 07/01/2020 CLINICAL DATA:  Worsening right hip pain over the past 2-3 weeks. History of prior right hip arthroplasty. No known injury. EXAM: CT OF THE RIGHT HIP WITHOUT CONTRAST TECHNIQUE: Multidetector CT imaging of the right hip was performed according to the standard protocol. Multiplanar CT image reconstructions were also generated. COMPARISON:  Plain films right femur 11/02/2014. FINDINGS: Bones/Joint/Cartilage Right total hip arthroplasty is in place and results in streak artifact on the study which limits evaluation, particularly of the proximal femur. The device is located. No evidence of loosening or other hardware complication is identified. No focal bone lesion is seen. Ligaments Suboptimally assessed by CT. Muscles and Tendons Intact and  normal in appearance. Soft tissues Atherosclerotic vascular disease noted. The patient is status post hysterectomy. IMPRESSION: No acute abnormality. Right hip arthroplasty results in fairly extensive streak artifact on the examination, particularly about the proximal femur. Recommend correlation with plain films of the hip if there is concern for a periprosthetic proximal femur fracture. Electronically Signed   By: Inge Rise M.D.   On: 07/01/2020 13:23         Scheduled Meds: . brimonidine  1 drop Both Eyes Q12H   And  . timolol  1 drop Both Eyes Q12H  . FLUoxetine  40 mg Oral Daily  . fluticasone furoate-vilanterol  1 puff Inhalation Daily  . lidocaine  1 patch Transdermal Q24H  . montelukast  10 mg Oral Daily  . moxifloxacin  1 drop Both Eyes TID  . nepafenac  1 drop Both Eyes Daily  . Netarsudil-Latanoprost  1 drop Both Eyes QPM  . sotalol  80 mg Oral Daily   Continuous Infusions:  Assessment & Plan:   Principal Problem:   Acute right hip pain Active Problems:   Moderate persistent asthma   Essential hypertension   GAD (generalized anxiety disorder)   Paroxysmal atrial fibrillation (HCC)   Chronic diastolic CHF (congestive heart failure) ([place])   #) Acute right hip pain:  As a result of decline in ambulatory activity . as result of this pain control, patient's daughter does not feel that she is able to provide the appropriate level of care for the patient at home at this tim Presenting CT right hip shows no evidence of acute abnormality, but notes extensive streak artifact relating to the prosthesis, limiting evaluation evaluation proximal femur fracture, with associated recommendation for further evaluation of the implant, the right hip.  Orthopedics Dr. [name] is aware and pending consultation -Lidocaine patch, pain control -PT recommends SNF     #) Chronic diastolic failure: Documented history of such, with most recent echocardiogram in January 2013 showing normal left ventricular cavity size, LVEF 60 to 65% Compensated, no acute exacerbation Monitor I/o Daily weight    #) Paroxysmal atrial fibrillation: stable. Pharmacy consulted to see if pt on Eliquis    #) Essential hypertension: stable Outpatient medications include Norvasc, hydralazine, losartan.   Continue to hold BP meds since stable and in case she needs any kind of intervention.          #) Generalized anxiety disorder:  Continue Prozac and Xanax as needed at home         #) Moderate persistent asthma: Outpatient respiratory regimen consists of scheduled Advair, Singulair.  No clinical evidence to suggest acute exacerbation.      DVT prophylaxis: scd Code Status: Full Family Communication: Daughter at bedside  Status is: Inpatient  Remains inpatient appropriate because:Inpatient level of care appropriate due to severity of illness   Dispo: The patient is from: Home              Anticipated d/c is to: SNF              Anticipated d/c date is: 2 days              Patient currently is not medically stable to d/c.w/u pending. Ortho consulted   Difficult to place patient No            LOS: 1 day   Time spent: 35 min with >50% on coc    Nolberto Hanlon, MD [facility] Pager [phone]  If 7PM-7AM, please contact  night-coverage 07/02/2020, 8:06 AM

## 2020-07-02 NOTE — Progress Notes (Signed)
NAME: Michele Summers  DOB: 1931-11-10  MRN: [id number]  Date/Time: 07/02/2020 3:50 PM  REQUESTING PROVIDER: Dr. Reed Pandy Subjective:  REASON FOR CONSULT: Right hip septic arthritis ?Patient is a limited historian.  Chart reviewed.   Michele Summers is a 87 y.o. female with a history of Right hip fracture status post hip replacement in 2013, hypertension, cardiac pacemaker,, A. fib presents with acute onset right hip pain of 1 week duration. Patient denies trauma Patient denies fever or chills Patient denies any redness or swelling. She states the pain started a week ago and it is progressively worsened and she is not able to walk. In the ED BP 115/57, temperature 97.5, heart rate 56, sats 98%. WBC was 6.5, Hb 10.7, platelet 328.  Creatinine 0.92.  ESR 40 and CRP 3 CT of the hip showed right hip arthroplasty with no acute abnormality.  But there was streak artifact from the metal.  So an x-ray of the hip was done and that showed lucency with cortical destruction in the proximal femur around the hip replacement stem concerning for infection/osteomyelitis. I am asked to see the patient to rule out infection Orthopedics has been consulted and Dr. [name] is planning to aspirate the hip.   Past Medical History:  Diagnosis Date  . Arthritis    Hands, feet, jaw  . Asthma   . CHF (congestive heart failure) ([place])   . Depression   . DVT (deep venous thrombosis) ([place])   . Dysrhythmia    A-FIB  . Fracture of hip ([place])    right  . Full dentures    UPPER AND LOWER  . Headache(784.0)   . Hypercholesterolemia   . Hypertension    CONTROLLED ON MEDS  . PE (pulmonary embolism)   . Presence of permanent cardiac pacemaker   . Stroke (cerebrum) (HCC)    DIFFICULT FOR HER TO COMPLETE HER SENTENCES,RESPONDS SLOWLY  . Stroke [facility]) 2006   CVA-CEREBRAL ANEURYSM  . UTI (urinary tract infection)    FREQUENT  . Wears dentures    full upper and lower    Past Surgical History:  Procedure Laterality Date   . ABDOMINAL HYSTERECTOMY    . BLADDER SURGERY    . BRAIN SURGERY    . CARDIAC CATHETERIZATION    . CATARACT EXTRACTION W/PHACO Left 09/15/2014   Procedure: CATARACT EXTRACTION PHACO AND INTRAOCULAR LENS PLACEMENT (IOC);  Surgeon: Ronnell Freshwater, MD;  Location: [place];  Service: Ophthalmology;  Laterality: Left;  . CATARACT EXTRACTION W/PHACO Right 03/23/2015   Procedure: CATARACT EXTRACTION PHACO AND INTRAOCULAR LENS PLACEMENT (IOC);  Surgeon: Ronnell Freshwater, MD;  Location: [place];  Service: Ophthalmology;  Laterality: Right;  . CHOLECYSTECTOMY    . CRANIOTOMY    . ECTROPION REPAIR Bilateral 02/28/2020   Procedure: REPAIR OF ECTROPION BILATERAL;  Surgeon: Karle Starch, MD;  Location: [place];  Service: Ophthalmology;  Laterality: Bilateral;  . INSERT / REPLACE / REMOVE PACEMAKER    . JOINT REPLACEMENT     TOTAL HIP    Social History   Socioeconomic History  . Marital status: Married    Spouse name: Not on file  . Number of children: Not on file  . Years of education: Not on file  . Highest education level: Not on file  Occupational History  . Not on file  Tobacco Use  . Smoking status: Never Smoker  . Smokeless tobacco: Never Used  Vaping Use  . Vaping Use: Never used  Substance and Sexual Activity  . Alcohol use: No  . Drug use: Never  . Sexual activity: Never  Other Topics Concern  . Not on file  Social History Narrative  . Not on file   Social Determinants of Health   Financial Resource Strain: Not on file  Food Insecurity: Not on file  Transportation Needs: Not on file  Physical Activity: Not on file  Stress: Not on file  Social Connections: Not on file  Intimate Partner Violence: Not on file    Family History  Problem Relation Age of Onset  . Asthma Mother   . Hypertension Mother   . Heart disease Father    Allergies  Allergen Reactions  . Aminoglycosides   . Bee Pollen   . Sulfa Antibiotics  Itching  . Tramadol     Makes her feel "crazy"  . Morphine And Related Other (See Comments)    unknown   I? Current Facility-Administered Medications  Medication Dose Route Frequency Provider Last Rate Last Admin  . albuterol (PROVENTIL) (2.5 MG/3ML) 0.083% nebulizer solution 2.5 mg  2.5 mg Inhalation Q4H PRN Howerter, Justin B, DO      . ALPRAZolam Duanne Moron) tablet 0.25 mg  0.25 mg Oral QHS PRN Howerter, Justin B, DO   0.25 mg at 07/01/20 2320  . brimonidine (ALPHAGAN) 0.2 % ophthalmic solution 1 drop  1 drop Both Eyes Q12H Howerter, Justin B, DO   1 drop at 07/02/20 0950   And  . timolol (TIMOPTIC) 0.5 % ophthalmic solution 1 drop  1 drop Both Eyes Q12H Howerter, Justin B, DO   1 drop at 07/02/20 0950  . FLUoxetine (PROZAC) capsule 40 mg  40 mg Oral Daily Howerter, Justin B, DO   40 mg at 07/02/20 0950  . fluticasone furoate-vilanterol (BREO ELLIPTA) 200-25 MCG/INH 1 puff  1 puff Inhalation Daily Howerter, Justin B, DO   1 puff at 07/02/20 0950  . HYDROcodone-acetaminophen (NORCO/VICODIN) 5-325 MG per tablet 1 tablet  1 tablet Oral Q4H PRN Howerter, Justin B, DO   1 tablet at 07/02/20 1153  . ketorolac (TORADOL) 15 MG/ML injection 15 mg  15 mg Intravenous Q6H PRN Howerter, Justin B, DO   15 mg at 07/02/20 0942  . lidocaine (LIDODERM) 5 % 1 patch  1 patch Transdermal Q24H Howerter, Justin B, DO   1 patch at 07/02/20 1505  . montelukast (SINGULAIR) tablet 10 mg  10 mg Oral Daily Howerter, Justin B, DO   10 mg at 07/02/20 0948  . moxifloxacin (VIGAMOX) 0.5 % ophthalmic solution 1 drop  1 drop Both Eyes TID Howerter, Justin B, DO   1 drop at 07/02/20 1505  . naloxone (NARCAN) injection 0.4 mg  0.4 mg Intravenous PRN Howerter, Justin B, DO      . nepafenac (NEVANAC) 0.1 % ophthalmic suspension 1 drop  1 drop Both Eyes Daily Howerter, Justin B, DO      . Netarsudil-Latanoprost 0.02-0.005 % SOLN 1 drop  1 drop Both Eyes QPM Howerter, Justin B, DO      . ondansetron (ZOFRAN) injection 4 mg  4 mg  Intravenous Q6H PRN Howerter, Justin B, DO      . sotalol (BETAPACE) tablet 80 mg  80 mg Oral Daily Howerter, Justin B, DO   80 mg at 07/02/20 0949  . zolpidem (AMBIEN) tablet 5 mg  5 mg Oral QHS PRN Howerter, Justin B, DO   5 mg at 07/01/20 2320     Abtx:  Anti-infectives (From admission,  onward)   None      REVIEW OF SYSTEMS:  Const: negative fever, negative chills, negative weight loss Eyes: negative diplopia or visual changes, negative eye pain ENT: negative coryza, negative sore throat Resp: negative cough, hemoptysis, dyspnea Cards: negative for chest pain, palpitations, lower extremity edema GU: negative for frequency, dysuria and hematuria GI: Negative for abdominal pain, diarrhea, bleeding, constipation Skin: negative for rash and pruritus Heme: negative for easy bruising and gum/nose bleeding MS: Right hip pain and inability to walk Neurolo:negative for headaches, dizziness, vertigo, memory problems  Psych: Anxiety Endocrine: negative for thyroid, diabetes Allergy/Immunology-as above Objective:  VITALS:  BP (!) 128/111 (BP Location: Left Arm)   Pulse 71   Temp 98 F (36.7 C)   Resp 16   Ht 5' 4" (1.626 m)   Wt 67 kg   SpO2 96%   BMI 25.35 kg/m  PHYSICAL EXAM:  General: Alert, cooperative, no distress at rest, appears stated age.  Head: Normocephalic, without obvious abnormality, atraumatic. Eyes: Right eye injected ENT Nares normal. No drainage or sinus tenderness. Lips, mucosa, and tongue normal. No Thrush Neck: Supple, symmetrical, no adenopathy, thyroid: non tender no carotid bruit and no JVD. Back: No CVA tenderness. Lungs: Clear to auscultation bilaterally. No Wheezing or Rhonchi. No rales. Heart: Regular rate and rhythm, no murmur, rub or gallop. Pacemaker site okay Abdomen: Soft, non-tender,not distended. Bowel sounds normal. No masses Extremities: No edema or erythema Skin: No rashes or lesions. Or bruising Lymph: Cervical, supraclavicular  normal. Neurologic: Grossly non-focal Pertinent Labs Lab Results CBC    Component Value Date/Time   WBC 6.5 07/02/2020 0417   RBC 4.05 07/02/2020 0417   HGB 10.7 (L) 07/02/2020 0417   HGB 12.0 07/29/2013 1427   HCT 33.4 (L) 07/02/2020 0417   HCT 36.2 07/29/2013 1427   PLT 328 07/02/2020 0417   PLT 277 07/29/2013 1427   MCV 82.5 07/02/2020 0417   MCV 80 07/29/2013 1427   MCH 26.4 07/02/2020 0417   MCHC 32.0 07/02/2020 0417   RDW 13.6 07/02/2020 0417   RDW 14.2 07/29/2013 1427   LYMPHSABS 1.2 07/17/2015 1500   LYMPHSABS 1.6 07/29/2013 1427   MONOABS 0.4 07/17/2015 1500   MONOABS 0.4 07/29/2013 1427   EOSABS 0.4 07/17/2015 1500   EOSABS 0.2 07/29/2013 1427   BASOSABS 0.1 07/17/2015 1500   BASOSABS 0.1 07/29/2013 1427    CMP Latest Ref Rng & Units 07/02/2020 07/01/2020 07/17/2015  Glucose 70 - 99 mg/dL 204(H) 244(H) 105(H)  BUN 8 - 23 mg/dL _0 Creatinine 0.44 - 1.00 mg/dL 0.92 0.80 0.75  Sodium 135 - 145 mmol/L 136 136 142  Potassium 3.5 - 5.1 mmol/L 4.0 4.3 4.0  Chloride 98 - 111 mmol/L 104 104 108  CO2 22 - 32 mmol/L _1 Calcium 8.9 - 10.3 mg/dL 8.9 8.8(L) 9.2  Total Protein 6.5 - 8.1 g/dL - - 6.7  Total Bilirubin 0.3 - 1.2 mg/dL - - 0.8  Alkaline Phos 38 - 126 U/L - - 72  AST 15 - 41 U/L - - 18  ALT 14 - 54 U/L - - 9(L)      Microbiology: Recent Results (from the past 240 hour(s))  Resp Panel by RT-PCR (Flu A&B, Covid) Nasopharyngeal Swab     Status: None   Collection Time: 07/01/20  4:45 PM   Specimen: Nasopharyngeal Swab; Nasopharyngeal(NP) swabs in vial transport medium  Result Value Ref Range Status   SARS Coronavirus 2 by RT  PCR NEGATIVE NEGATIVE Final    Comment: (NOTE) SARS-CoV-2 target nucleic acids are NOT DETECTED.  The SARS-CoV-2 RNA is generally detectable in upper respiratory specimens during the acute phase of infection. The lowest concentration of SARS-CoV-2 viral copies this assay can detect is 138 copies/mL. A negative result does  not preclude SARS-Cov-2 infection and should not be used as the sole basis for treatment or other patient management decisions. A negative result may occur with  improper specimen collection/handling, submission of specimen other than nasopharyngeal swab, presence of viral mutation(s) within the areas targeted by this assay, and inadequate number of viral copies(<138 copies/mL). A negative result must be combined with clinical observations, patient history, and epidemiological information. The expected result is Negative.  Fact Sheet for Patients:  EntrepreneurPulse.com.au  Fact Sheet for Healthcare Providers:  IncredibleEmployment.be  This test is no t yet approved or cleared by the [place] FDA and  has been authorized for detection and/or diagnosis of SARS-CoV-2 by FDA under an Emergency Use Authorization (EUA). This EUA will remain  in effect (meaning this test can be used) for the duration of the COVID-19 declaration under Section 564(b)(1) of the Act, 21 U.S.C.section 360bbb-3(b)(1), unless the authorization is terminated  or revoked sooner.       Influenza A by PCR NEGATIVE NEGATIVE Final   Influenza B by PCR NEGATIVE NEGATIVE Final    Comment: (NOTE) The Xpert Xpress SARS-CoV-2/FLU/RSV plus assay is intended as an aid in the diagnosis of influenza from Nasopharyngeal swab specimens and should not be used as a sole basis for treatment. Nasal washings and aspirates are unacceptable for Xpert Xpress SARS-CoV-2/FLU/RSV testing.  Fact Sheet for Patients: EntrepreneurPulse.com.au  Fact Sheet for Healthcare Providers: IncredibleEmployment.be  This test is not yet approved or cleared by the [place] FDA and has been authorized for detection and/or diagnosis of SARS-CoV-2 by FDA under an Emergency Use Authorization (EUA). This EUA will remain in effect (meaning this test can be used) for the  duration of the COVID-19 declaration under Section 564(b)(1) of the Act, 21 U.S.C. section 360bbb-3(b)(1), unless the authorization is terminated or revoked.  Performed at [facility], [address] [place], [place] [address]     IMAGING RESULTS   I have personally reviewed the films ? Impression/Recommendation ? ?Right hip arthroplasty--acute onset right hip pain for the past week or 2. X-ray shows cortical destruction with question of infection/osteomyelitis. Dr. [name] orthopedics is seen the patient.  Recommend IR aspirating the hip joint .  If that is not successful I would recommend bone biopsy for culture and pathology. Patient has no fever.  ESR is minimally elevated at 40 and CRP is near normal at 3. We will send blood cultures. Hold off any antibiotics until procedure is being done.  Pacemaker in place  ?Paroxysmal A. fib.  On sotalol.  ___Anemia next  Hypertension  Hyperglycemia ____________________________________ Discussed with patient, requesting provider Note:  This document was prepared using Dragon voice recognition software and may include unintentional dictation errors.

## 2020-07-02 NOTE — TOC Initial Note (Addendum)
Transition of Care [facility]) - Initial/Assessment Note    Patient Details  Name: Michele Summers MRN: [id number] Date of Birth: 11-07-1931  Transition of Care [facility]) CM/SW Contact:    Magnus Ivan, LCSW Phone Number: 07/02/2020, 11:04 AM  Clinical Narrative:                CSW spoke with patient and patient's daughter. Patient lives alone. Daughter provides transportation. PCP is Dr. Humphrey Rolls. Pharmacy is [facility] on [address]. Patient has a RW and shower chair. Patient had [facility] in the past, couldn't recall agency used. Patient went to [facility] in the past. Patient has not had her COVID vaccines. Patient and daughter agreeable to SNF recommendation, prefer [facility]. CSW started work up, reached out to [place] at [facility].. Per MD, patient not medically ready for DC yet.  2:35- [facility] reported they would have a bed for patient on Monday if medically ready.   Expected Discharge Plan: Skilled Nursing Facility Barriers to Discharge: Continued Medical Work up   Patient Goals and CMS Choice Patient states their goals for this hospitalization and ongoing recovery are:: SNF rehab CMS Medicare.gov Compare Post Acute Care list provided to:: Patient Choice offered to / list presented to : [place]  Expected Discharge Plan and Services Expected Discharge Plan: [place]       Living arrangements for the past 2 months: Single Family Home                                      Prior Living Arrangements/Services Living arrangements for the past 2 months: Single Family Home Lives with:: Self Patient language and need for interpreter reviewed:: Yes Do you feel safe going back to the place where you live?: Yes      Need for Family Participation in Patient Care: Yes (Comment) Care giver support system in place?: Yes (comment) Current home services: DME Criminal Activity/Legal Involvement Pertinent to Current  Situation/Hospitalization: No - Comment as needed  Activities of Daily Living Home Assistive Devices/Equipment: None ADL Screening (condition at time of admission) Patient's cognitive ability adequate to safely complete daily activities?: Yes Is the patient deaf or have difficulty hearing?: No Does the patient have difficulty seeing, even when wearing glasses/contacts?: Yes Does the patient have difficulty concentrating, remembering, or making decisions?: Yes Patient able to express need for assistance with ADLs?: Yes Does the patient have difficulty dressing or bathing?: No Independently performs ADLs?: Yes (appropriate for developmental age) Does the patient have difficulty walking or climbing stairs?: Yes Weakness of Legs: None Weakness of Arms/Hands: None  Permission Sought/Granted Permission sought to share information with : Facility Retail banker granted to share information with : Yes, Verbal Permission Granted     Permission granted to share info w AGENCY: SNFs        Emotional Assessment       Orientation: : Oriented to Self,Oriented to Place,Oriented to  Time,Oriented to Situation Alcohol / Substance Use: Not Applicable Psych Involvement: No (comment)  Admission diagnosis:  Right hip pain [M25.551] Acute right hip pain [M25.551] Patient Active Problem List   Diagnosis Date Noted  . Chronic diastolic CHF (congestive heart failure) ([place]) 07/02/2020  . Acute right hip pain 07/01/2020  . Depression, major, single episode, moderate ([place]) 05/06/2020  . Paroxysmal atrial fibrillation ([place]) 03/22/2020  . Pacemaker 03/22/2020  . Encounter for long-term (current)  use of medications 03/01/2020  . Complicated grief 59/45/8592  . Mixed hyperlipidemia 04/14/2018  . Sprain of wrist 04/10/2018  . Encounter for general adult medical examination with abnormal findings 12/22/2017  . GAD (generalized anxiety disorder) 12/22/2017  . Need for  vaccination against Streptococcus pneumoniae using pneumococcal conjugate vaccine 13 12/22/2017  . Dysuria 10/04/2017  . Insomnia due to anxiety and fear 10/04/2017  . Unspecified menopausal and perimenopausal disorder 10/04/2017  . Essential hypertension 06/25/2017  . Acute bronchitis 06/13/2017  . Altered mental status 06/14/2011  . Pulmonary embolism ([place]) 06/14/2011  . Respiratory failure with hypoxia ([place]) 06/14/2011  . Pneumonia 06/14/2011  . Acute renal failure ([place]) 06/14/2011  . Femur fracture, right ([place]) 06/14/2011  . Moderate persistent asthma 06/14/2011  . Urinary tract infection with hematuria 06/14/2011   PCP:  [name], MD Pharmacy:   [facility], [address] [place] [address] Phone: [phone] Fax: [phone]  [facility] [place], [place] [address] [place] [address] Phone: [phone] Fax: [phone]  [id number] - [place], [address] [place] [address] Phone: [phone] Fax: [facility] [place], [place] [address] Phone: [phone] Fax: [phone]     Social Determinants of Health (SDOH) Interventions    Readmission Risk Interventions No flowsheet data found.

## 2020-07-02 NOTE — Progress Notes (Signed)
Initial Nutrition Assessment  DOCUMENTATION CODES:   Non-severe (moderate) malnutrition in context of chronic illness  INTERVENTION:  Provide Ensure Enlive po BID, each supplement provides 350 kcal and 20 grams of protein. Patient prefers strawberry.  Provide MVI po daily.  NUTRITION DIAGNOSIS:   Moderate Malnutrition related to chronic illness (CHF) as evidenced by mild fat depletion,mild muscle depletion,moderate muscle depletion.  GOAL:   Patient will meet greater than or equal to 90% of their needs  MONITOR:   PO intake,Supplement acceptance,Labs,Weight trends,I & O's  REASON FOR ASSESSMENT:   Malnutrition Screening Tool    ASSESSMENT:   87 year old female with PMHx of asthma, PE, DVT, depression, CHF, arthritis, CVA, HTN, hx right hip fracture in 2013 s/p total right hip arthroplasty now admitted with acute right hip pain.   Met with patient and her daughter at bedside. Patient reports she has had a decreased appetite. She usually eats 2 meals per day. She has a donut with coffee for breakfast. She receives Meals on Wheels and may have that as her other meal or she may have peanut butter with banana or peanut butter with jelly. Patient reports she ate well at breakfast and then ate about 50% of her lunch. Discussed increased needs and importance of adequate intake. Patient prefers Boost but is amenable to trying Ensure Enlive to see if she likes it. She prefers Soil scientist.  Patient reports her UBW is 148 lbs and that she is weight-stable. Patient currently documented to be 67 kg (147.71 lbs). Weight appears stable in chart.  Medications reviewed.  Labs reviewed.  NUTRITION - FOCUSED PHYSICAL EXAM:  Flowsheet Row Most Recent Value  Orbital Region Mild depletion  Upper Arm Region Mild depletion  Thoracic and Lumbar Region No depletion  Buccal Region Mild depletion  Temple Region Moderate depletion  Clavicle Bone Region Moderate depletion  Clavicle and  Acromion Bone Region Moderate depletion  Scapular Bone Region Mild depletion  Dorsal Hand Moderate depletion  Patellar Region Moderate depletion  Anterior Thigh Region Moderate depletion  Posterior Calf Region Severe depletion  Edema (RD Assessment) None  Hair Reviewed  Eyes Reviewed  Mouth Reviewed  Skin Reviewed  Nails Reviewed     Diet Order:   Diet Order            Diet regular Room service appropriate? Yes; Fluid consistency: Thin  Diet effective now                EDUCATION NEEDS:   No education needs have been identified at this time  Skin:  Skin Assessment: Reviewed RN Assessment  Last BM:  06/30/2020 per chart  Height:   Ht Readings from Last 1 Encounters:  07/01/20 _0  (1.626 m)   Weight:   Wt Readings from Last 1 Encounters:  07/02/20 67 kg   BMI:  Body mass index is 25.35 kg/m.  Estimated Nutritional Needs:   Kcal:  1700-1900  Protein:  85-95 grams  Fluid:  1.7 L/day  Jacklynn Barnacle, MS, RD, LDN Pager number available on Amion

## 2020-07-02 NOTE — NC FL2 (Signed)
[place] LEVEL OF CARE SCREENING TOOL     IDENTIFICATION  Patient Name: Michele Summers Birthdate: [birth year] Sex: female Admission Date (Current Location): 07/01/2020  [place] and [place] Number:  Engineering geologist and Address:  [facility], [address], [place], [place] [address]      Provider Number: [id number]  Attending Physician Name and Address:  Nolberto Hanlon, MD  Relative Name and Phone Number:  Lonzo Cloud (Daughter)   [phone] [facility])    Current Level of Care: Hospital Recommended Level of Care: [place] Prior Approval Number:    Date Approved/Denied:   PASRR Number: pending  Discharge Plan: SNF    Current Diagnoses: Patient Active Problem List   Diagnosis Date Noted  . Chronic diastolic CHF (congestive heart failure) ([place]) 07/02/2020  . Acute right hip pain 07/01/2020  . Depression, major, single episode, moderate ([place]) 05/06/2020  . Paroxysmal atrial fibrillation ([place]) 03/22/2020  . Pacemaker 03/22/2020  . Encounter for long-term (current) use of medications 03/01/2020  . Complicated grief 50/01/3817  . Mixed hyperlipidemia 04/14/2018  . Sprain of wrist 04/10/2018  . Encounter for general adult medical examination with abnormal findings 12/22/2017  . GAD (generalized anxiety disorder) 12/22/2017  . Need for vaccination against Streptococcus pneumoniae using pneumococcal conjugate vaccine 13 12/22/2017  . Dysuria 10/04/2017  . Insomnia due to anxiety and fear 10/04/2017  . Unspecified menopausal and perimenopausal disorder 10/04/2017  . Essential hypertension 06/25/2017  . Acute bronchitis 06/13/2017  . Altered mental status 06/14/2011  . Pulmonary embolism ([place]) 06/14/2011  . Respiratory failure with hypoxia ([place]) 06/14/2011  . Pneumonia 06/14/2011  . Acute renal failure ([place]) 06/14/2011  . Femur fracture, right ([place]) 06/14/2011  . Moderate persistent asthma 06/14/2011  . Urinary  tract infection with hematuria 06/14/2011    Orientation RESPIRATION BLADDER Height & Weight     Self,Time,Situation,Place  Normal Incontinent Weight: 147 lb 11.3 oz (67 kg) Height:  5\' 4"  (162.6 cm)  BEHAVIORAL SYMPTOMS/MOOD NEUROLOGICAL BOWEL NUTRITION STATUS        Diet (regular, thin liquids)  AMBULATORY STATUS COMMUNICATION OF NEEDS Skin   Extensive Assist Verbally Normal                       Personal Care Assistance Level of Assistance  Bathing,Feeding,Dressing Bathing Assistance: Maximum assistance Feeding assistance: Limited assistance Dressing Assistance: Maximum assistance     Functional Limitations Info             SPECIAL CARE FACTORS FREQUENCY  OT (By licensed OT),PT (By licensed PT)     PT Frequency: 5 x/week OT Frequency: 5 x/week            Contractures      Additional Factors Info  Code Status,Allergies Code Status Info: full code Allergies Info: aminoglycosides, bee pollen, sulfa antibiotics, tramadol, morphined and related           Current Medications (07/02/2020):  This is the current hospital active medication list Current Facility-Administered Medications  Medication Dose Route Frequency Provider Last Rate Last Admin  . albuterol (PROVENTIL) (2.5 MG/3ML) 0.083% nebulizer solution 2.5 mg  2.5 mg Inhalation Q4H PRN Howerter, Justin B, DO      . ALPRAZolam Duanne Moron) tablet 0.25 mg  0.25 mg Oral QHS PRN Howerter, Justin B, DO   0.25 mg at 07/01/20 2320  . brimonidine (ALPHAGAN) 0.2 % ophthalmic solution 1 drop  1 drop Both Eyes Q12H Howerter, Justin B, DO  1 drop at 07/02/20 0950   And  . timolol (TIMOPTIC) 0.5 % ophthalmic solution 1 drop  1 drop Both Eyes Q12H Howerter, Justin B, DO   1 drop at 07/02/20 0950  . FLUoxetine (PROZAC) capsule 40 mg  40 mg Oral Daily Howerter, Justin B, DO   40 mg at 07/02/20 0950  . fluticasone furoate-vilanterol (BREO ELLIPTA) 200-25 MCG/INH 1 puff  1 puff Inhalation Daily Howerter, Justin B, DO   1 puff  at 07/02/20 0950  . HYDROcodone-acetaminophen (NORCO/VICODIN) 5-325 MG per tablet 1 tablet  1 tablet Oral Q4H PRN Howerter, Justin B, DO   1 tablet at 07/02/20 0440  . ketorolac (TORADOL) 15 MG/ML injection 15 mg  15 mg Intravenous Q6H PRN Howerter, Justin B, DO   15 mg at 07/02/20 0942  . lidocaine (LIDODERM) 5 % 1 patch  1 patch Transdermal Q24H Howerter, Justin B, DO   1 patch at 07/01/20 1516  . montelukast (SINGULAIR) tablet 10 mg  10 mg Oral Daily Howerter, Justin B, DO   10 mg at 07/02/20 0948  . moxifloxacin (VIGAMOX) 0.5 % ophthalmic solution 1 drop  1 drop Both Eyes TID Howerter, Justin B, DO   1 drop at 07/02/20 0949  . naloxone [facility]) injection 0.4 mg  0.4 mg Intravenous PRN Howerter, Justin B, DO      . nepafenac (NEVANAC) 0.1 % ophthalmic suspension 1 drop  1 drop Both Eyes Daily Howerter, Justin B, DO      . Netarsudil-Latanoprost 0.02-0.005 % SOLN 1 drop  1 drop Both Eyes QPM Howerter, Justin B, DO      . ondansetron (ZOFRAN) injection 4 mg  4 mg Intravenous Q6H PRN Howerter, Justin B, DO      . sotalol (BETAPACE) tablet 80 mg  80 mg Oral Daily Howerter, Justin B, DO   80 mg at 07/02/20 0949  . zolpidem (AMBIEN) tablet 5 mg  5 mg Oral QHS PRN Howerter, Justin B, DO   5 mg at 07/01/20 2320     Discharge Medications: Please see discharge summary for a list of discharge medications.  Relevant Imaging Results:  Relevant Lab Results:   Additional Information SS #: [id number]  [id number], LCSW

## 2020-07-02 NOTE — Progress Notes (Addendum)
RE: Michele Summers. [place] Date of Birth: 03/23/1932 Date: 07/02/20   To Whom It May Concern:  Please be advised that the above-named patient will require a short-term nursing home stay - anticipated 30 days or less for rehabilitation and strengthening.  The plan is for return home.

## 2020-07-02 NOTE — Consult Note (Signed)
ORTHOPAEDIC CONSULTATION  REQUESTING PHYSICIAN: Nolberto Hanlon, MD  Chief Complaint: right hip pain  HPI: Michele Summers is a 87 y.o. female who complains of right hip pain. Please see H&P and ED notes for details. Denies any numbness, tingling or constitutional symptoms.  Past Medical History:  Diagnosis Date  . Arthritis    Hands, feet, jaw  . Asthma   . CHF (congestive heart failure) ([place])   . Depression   . DVT (deep venous thrombosis) ([place])   . Dysrhythmia    A-FIB  . Fracture of hip ([place])    right  . Full dentures    UPPER AND LOWER  . Headache(784.0)   . Hypercholesterolemia   . Hypertension    CONTROLLED ON MEDS  . PE (pulmonary embolism)   . Presence of permanent cardiac pacemaker   . Stroke (cerebrum) (HCC)    DIFFICULT FOR HER TO COMPLETE HER SENTENCES,RESPONDS SLOWLY  . Stroke [facility]) 2006   CVA-CEREBRAL ANEURYSM  . UTI (urinary tract infection)    FREQUENT  . Wears dentures    full upper and lower   Past Surgical History:  Procedure Laterality Date  . ABDOMINAL HYSTERECTOMY    . BLADDER SURGERY    . BRAIN SURGERY    . CARDIAC CATHETERIZATION    . CATARACT EXTRACTION W/PHACO Left 09/15/2014   Procedure: CATARACT EXTRACTION PHACO AND INTRAOCULAR LENS PLACEMENT (IOC);  Surgeon: Ronnell Freshwater, MD;  Location: [facility];  Service: Ophthalmology;  Laterality: Left;  . CATARACT EXTRACTION W/PHACO Right 03/23/2015   Procedure: CATARACT EXTRACTION PHACO AND INTRAOCULAR LENS PLACEMENT (IOC);  Surgeon: Ronnell Freshwater, MD;  Location: [facility];  Service: Ophthalmology;  Laterality: Right;  . CHOLECYSTECTOMY    . CRANIOTOMY    . ECTROPION REPAIR Bilateral 02/28/2020   Procedure: REPAIR OF ECTROPION BILATERAL;  Surgeon: Karle Starch, MD;  Location: [facility];  Service: Ophthalmology;  Laterality: Bilateral;  . INSERT / REPLACE / REMOVE PACEMAKER    . JOINT REPLACEMENT     TOTAL HIP   Social History   Socioeconomic  History  . Marital status: Married    Spouse name: Not on file  . Number of children: Not on file  . Years of education: Not on file  . Highest education level: Not on file  Occupational History  . Not on file  Tobacco Use  . Smoking status: Never Smoker  . Smokeless tobacco: Never Used  Vaping Use  . Vaping Use: Never used  Substance and Sexual Activity  . Alcohol use: No  . Drug use: Never  . Sexual activity: Never  Other Topics Concern  . Not on file  Social History Narrative  . Not on file   Social Determinants of Health   Financial Resource Strain: Not on file  Food Insecurity: Not on file  Transportation Needs: Not on file  Physical Activity: Not on file  Stress: Not on file  Social Connections: Not on file   Family History  Problem Relation Age of Onset  . Asthma Mother   . Hypertension Mother   . Heart disease Father    Allergies  Allergen Reactions  . Aminoglycosides   . Bee Pollen   . Sulfa Antibiotics Itching  . Tramadol     Makes her feel "crazy"  . Morphine And Related Other (See Comments)    unknown   Prior to Admission medications   Medication Sig Start Date End Date Taking? Authorizing Provider  ALPRAZolam Duanne Moron)  0.25 MG tablet Take one tab a day prn for anxiety and panic attacks 05/06/20  Yes Boscia, Heather E, NP  amLODipine (NORVASC) 5 MG tablet TAKE ONE TABLET BY MOUTH AT BEDTIME FOR HTN Patient taking differently: Take 5 mg by mouth daily. TAKE ONE TABLET BY MOUTH AT BEDTIME FOR HTN 05/06/20  Yes Boscia, Heather E, NP  apixaban (ELIQUIS) 2.5 MG TABS tablet Take 1 tablet (2.5 mg total) by mouth once for 1 dose. 03/19/20 03/19/20 Yes Masoud, Viann Shove, MD  brimonidine-timolol (COMBIGAN) 0.2-0.5 % ophthalmic solution Place 1 drop into both eyes every 12 (twelve) hours.   Yes [provider]  conjugated estrogens (PREMARIN) vaginal cream Place 1 Applicatorful vaginally 2 (two) times a week. 05/07/20  Yes Boscia, Greer Ee, NP  FLUoxetine  (PROZAC) 40 MG capsule Take 1 capsule (40 mg total) by mouth daily. AM 05/06/20  Yes Ronnell Freshwater, NP  hydrALAZINE (APRESOLINE) 25 MG tablet Take 1 tablet (25 mg total) by mouth daily. 05/06/20  Yes Boscia, Heather E, NP  KRILL OIL PO Take by mouth.   Yes [provider]  losartan (COZAAR) 50 MG tablet Take 1 tablet (50 mg total) by mouth daily. 05/06/20  Yes Boscia, Heather E, NP  montelukast (SINGULAIR) 10 MG tablet Take 1 tablet (10 mg total) by mouth daily. 05/06/20  Yes Boscia, Greer Ee, NP  moxifloxacin (VIGAMOX) 0.5 % ophthalmic solution 1 drop 3 (three) times daily.   Yes [provider]  Multiple Vitamins-Minerals (PRESERVISION AREDS PO) Take by mouth. AM   Yes [provider]  nepafenac (ILEVRO) 0.3 % ophthalmic suspension 1 drop daily.   Yes [provider]  ROCKLATAN 0.02-0.005 % SOLN Place 1 drop into both eyes every evening. 05/25/20  Yes [provider]  sotalol (BETAPACE) 80 MG tablet Take 1 tablet (80 mg total) by mouth daily. 05/06/20  Yes Boscia, Heather E, NP  travoprost, benzalkonium, (TRAVATAN) 0.004 % ophthalmic solution 1 drop at bedtime.   Yes [provider]  vitamin B-12 (CYANOCOBALAMIN) 500 MCG tablet Take 500 mcg by mouth daily.   Yes [provider]  zolpidem (AMBIEN) 5 MG tablet Take one tab po qhs for sleep 05/06/20  Yes Boscia, Heather E, NP  acetaminophen-codeine (TYLENOL #3) 300-30 MG tablet Take 1 tablet by mouth every 8 (eight) hours. 06/22/20   [provider]  albuterol (VENTOLIN HFA) 108 (90 Base) MCG/ACT inhaler INHALE 2 PUFFS INTO LUNGS EVERY 6 HOURS AS NEEDED FOR SHORTNESS OF BREATH Patient taking differently: Inhale 2 puffs into the lungs every 6 (six) hours as needed for shortness of breath. 03/03/20   [name], MD  albuterol-ipratropium [facility]) [phone] MCG/ACT inhaler Inhale into the lungs every 4 (four) hours. Patient not taking: Reported on 07/01/2020    [provider]  erythromycin ophthalmic ointment Apply to sutures 4 times a day for 10-12 days.  Discontinue if allergy develops and call our office 02/28/20   Karle Starch, MD  Fluticasone-Salmeterol (ADVAIR DISKUS) 250-50 MCG/DOSE AEPB Inhale 1 puff into the lungs 2 (two) times daily. 05/06/20   Ronnell Freshwater, NP  oxyCODONE-acetaminophen (PERCOCET) 5-325 MG tablet Take 1 tablet by mouth every 4 (four) hours as needed for severe pain. Patient not taking: No sig reported 02/28/20   Karle Starch, MD   CT Hip Right Wo Contrast  Result Date: 07/01/2020 CLINICAL DATA:  Worsening right hip pain over the past 2-3 weeks. History of prior right hip arthroplasty. No known injury.  EXAM: CT OF THE RIGHT HIP WITHOUT CONTRAST TECHNIQUE: Multidetector CT imaging of the right hip was performed according to the standard protocol. Multiplanar CT image reconstructions were also generated. COMPARISON:  Plain films right femur 11/02/2014. FINDINGS: Bones/Joint/Cartilage Right total hip arthroplasty is in place and results in streak artifact on the study which limits evaluation, particularly of the proximal femur. The device is located. No evidence of loosening or other hardware complication is identified. No focal bone lesion is seen. Ligaments Suboptimally assessed by CT. Muscles and Tendons Intact and normal in appearance. Soft tissues Atherosclerotic vascular disease noted. The patient is status post hysterectomy. IMPRESSION: No acute abnormality. Right hip arthroplasty results in fairly extensive streak artifact on the examination, particularly about the proximal femur. Recommend correlation with plain films of the hip if there is concern for a periprosthetic proximal femur fracture. Electronically Signed   By: Inge Rise M.D.   On: 07/01/2020 13:23   DG HIP UNILAT WITH PELVIS 2-3 VIEWS RIGHT  Result Date: 07/02/2020 CLINICAL DATA:  Acute right hip pain EXAM: DG HIP (WITH OR WITHOUT PELVIS) 2-3V RIGHT  COMPARISON:  12/02/2014 FINDINGS: Changes of prior right hip replacement. Lucency and bone destruction noted within the proximal femur in the region of the greater trochanter and proximal shaft. This is concerning for possible infection/osteomyelitis. This is new since prior study. Mild degenerative changes in the left hip. Diffuse vascular calcifications. IMPRESSION: Prior right hip replacement. Lucency with cortical destruction noted in the proximal femur around the hip replacement stem concerning for infection/osteomyelitis. Electronically Signed   By: Rolm Baptise M.D.   On: 07/02/2020 09:10    Positive ROS: All other systems have been reviewed and were otherwise negative with the exception of those mentioned in the HPI and as above.  Physical Exam: General: Alert, no acute distress Cardiovascular: No pedal edema Respiratory: No cyanosis, no use of accessory musculature GI: No organomegaly, abdomen is soft and non-tender Skin: No lesions in the area of chief complaint Neurologic: Sensation intact distally Psychiatric: Patient is competent for consent with normal mood and affect Lymphatic: No axillary or cervical lymphadenopathy  MUSCULOSKELETAL: pain with IR/ER. Compartments soft. Good cap refill. Motor and sensory intact distally.  Assessment: Right hip pain after remote hemiarthroplasty for fracture  Plan: Patient with significant pain and mildly elevated ESR. No evidence of fracture. There is stress shielding of the distal implant they may cause osteolysis of the lateral femur. Would be prudent to rule out infection with an aspiration. She is a poor surgical candidate and would prefer non-operative management. Next steps will depend on her aspiration results. Would recommend protected weight bearing, suppressive antibiotic coverage if necessary and pain control. Will follow. Please call with questions.   Lovell Sheehan, MD    07/02/2020 5:51 PM

## 2020-07-02 NOTE — Plan of Care (Signed)
scd applied

## 2020-07-03 ENCOUNTER — Inpatient Hospital Stay: Payer: Medicare Other

## 2020-07-03 DIAGNOSIS — M25551 Pain in right hip: Secondary | ICD-10-CM | POA: Diagnosis not present

## 2020-07-03 MED ORDER — SODIUM CHLORIDE (PF) 0.9 % IJ SOLN
10.0000 mL | Freq: Once | INTRAMUSCULAR | Status: AC
  Administered 2020-07-03: 5 mL

## 2020-07-03 MED ORDER — LIDOCAINE HCL (PF) 1 % IJ SOLN
15.0000 mL | Freq: Once | INTRAMUSCULAR | Status: AC
  Administered 2020-07-03: 15 mL

## 2020-07-03 NOTE — Procedures (Signed)
The thigh was prepped and draped and attempts to aspirate area on CT were made with little success with 18 and 20 gauge needles.  The patient tolerated the procedure well.

## 2020-07-03 NOTE — Progress Notes (Signed)
Pt's BP is 171/63. MD notified.

## 2020-07-03 NOTE — Plan of Care (Signed)
  Problem: Education: Goal: Knowledge of General Education information will improve Description: Including pain rating scale, medication(s)/side effects and non-pharmacologic comfort measures Outcome: Progressing   Problem: Clinical Measurements: Goal: Ability to maintain clinical measurements within normal limits will improve Outcome: Progressing Goal: Will remain free from infection Outcome: Progressing Goal: Diagnostic test results will improve Outcome: Progressing   Problem: Activity: Goal: Risk for activity intolerance will decrease Outcome: Progressing   Problem: Pain Managment: Goal: General experience of comfort will improve Outcome: Progressing   Problem: Safety: Goal: Ability to remain free from injury will improve Outcome: Progressing   Problem: Skin Integrity: Goal: Risk for impaired skin integrity will decrease Outcome: Progressing   

## 2020-07-03 NOTE — Progress Notes (Signed)
Date of Admission:  07/01/2020     ID: Michele Summers is a 87 y.o. female  Principal Problem:   Acute right hip pain Active Problems:   Moderate persistent asthma   Essential hypertension   GAD (generalized anxiety disorder)   Paroxysmal atrial fibrillation (HCC)   Chronic diastolic CHF (congestive heart failure) (HCC)   Malnutrition of moderate degree  Patient was seen with Dr. [name]  Subjective: Patient's daughter at bedside Patient has pain in her right hip but slightly better than before She is still in bed. She underwent aspiration of the right hip but it was a dry tap. No fever  Medications:  . brimonidine  1 drop Both Eyes Q12H   And  . timolol  1 drop Both Eyes Q12H  . feeding supplement  237 mL Oral BID BM  . FLUoxetine  40 mg Oral Daily  . fluticasone furoate-vilanterol  1 puff Inhalation Daily  . lidocaine  1 patch Transdermal Q24H  . montelukast  10 mg Oral Daily  . moxifloxacin  1 drop Both Eyes TID  . multivitamin with minerals  1 tablet Oral Daily  . nepafenac  1 drop Both Eyes Daily  . Netarsudil-Latanoprost  1 drop Both Eyes QPM  . sotalol  80 mg Oral Daily    Objective: Vital signs in last 24 hours: Temp:  [97.6 F (36.4 C)-98 F (36.7 C)] 97.8 F (36.6 C) (02/18 1553) Pulse Rate:  [62-81] 65 (02/18 1553) Resp:  [16-18] 17 (02/18 1553) BP: (128-171)/(52-69) 128/57 (02/18 1553) SpO2:  [94 %-97 %] 96 % (02/18 1553)  PHYSICAL EXAM:  General: Alert, cooperative, no distress, appears stated age.  Head: Normocephalic, without obvious abnormality, atraumatic. Eyes: Conjunctive are injected.  She gets injection for macular degeneration.  ENT Nares normal. No drainage or sinus tenderness. Lips, mucosa, and tongue normal. No Thrush Neck: Supple, symmetrical, no adenopathy, thyroid: non tender no carotid bruit and no JVD. Back: No CVA tenderness. Lungs: Clear to auscultation bilaterally. No Wheezing or Rhonchi. No rales. Heart: Regular rate and  rhythm, no murmur, rub or gallop. Abdomen: Soft, non-tender,not distended. Bowel sounds normal. No masses Extremities: Movement of the right leg better than before but still painful skin: No rashes or lesions. Or bruising Lymph: Cervical, supraclavicular normal. Neurologic: Grossly non-focal  Lab Results Recent Labs    07/01/20 1857 07/02/20 0417  WBC 5.2 6.5  HGB 11.0* 10.7*  HCT 34.8* 33.4*  NA 136 136  K 4.3 4.0  CL 104 104  CO2 22 24  BUN 16 18  CREATININE 0.80 0.92   Liver Panel No results for input(s): PROT, ALBUMIN, AST, ALT, ALKPHOS, BILITOT, BILIDIR, IBILI in the last 72 hours. Sedimentation Rate Recent Labs    07/02/20 1300  ESRSEDRATE 40*   C-Reactive Protein Recent Labs    07/02/20 1300  CRP 3.0*    Microbiology:  Studies/Results: DG FLUORO GUIDED NEEDLE PLC ASPIRATION/INJECTION LOC  Result Date: 07/03/2020 CLINICAL DATA:  History of prosthetic hip with hip pain. EXAM: RIGHT HIP ASPIRATION UNDER FLUOROSCOPY COMPARISON:  CT OF THE SAME DATE. FLUOROSCOPY TIME:  Fluoroscopy Time:  30 SECONDS Radiation Exposure Index (if provided by the fluoroscopic device): 5.1 mGy Number of Acquired Spot Images: 0 PROCEDURE: Consent was obtained, time-out was performed to verify site and side of procedure. The patient's RIGHT anterior thigh was prepped and draped in the usual sterile fashion, numbing with 15 cc of lidocaine was performed. First with a 20 gauge needle and subsequently with  an 18 gauge needle attempts to aspirate this area were made without success. A small amount of saline was flushed through the needle and additional attempts were made to aspirate yielding a scant amount of material and a small amount of blood. Sample was sent to the lab for requested testing. IMPRESSION: Attempted aspiration of the fluid density area along the anterior margin of the RIGHT proximal femur, associated with bony resorption. This yielded scant material, material sent for requested  testing. Electronically Signed   By: Zetta Bills M.D.   On: 07/03/2020 10:37   DG HIP UNILAT WITH PELVIS 2-3 VIEWS RIGHT  Result Date: 07/02/2020 CLINICAL DATA:  Acute right hip pain EXAM: DG HIP (WITH OR WITHOUT PELVIS) 2-3V RIGHT COMPARISON:  12/02/2014 FINDINGS: Changes of prior right hip replacement. Lucency and bone destruction noted within the proximal femur in the region of the greater trochanter and proximal shaft. This is concerning for possible infection/osteomyelitis. This is new since prior study. Mild degenerative changes in the left hip. Diffuse vascular calcifications. IMPRESSION: Prior right hip replacement. Lucency with cortical destruction noted in the proximal femur around the hip replacement stem concerning for infection/osteomyelitis. Electronically Signed   By: Rolm Baptise M.D.   On: 07/02/2020 09:10     Assessment/Plan:  Right hip hemiarthroplasty following fractured femur in 2013.  Patient presents with 3-week history of gradually worsening pain and now unable to bear weight on that leg.  X-ray shows cortical destruction with question of infection/osteomyelitis.  Dr. [name] recommended aspiration of the right hip but no fluid was obtained.  Saline was injected by IR and it was sent for culture. Patient has no fever ESR is minimally elevated at 40 and CRP at 3.  Blood cultures pending Patient is not a candidate for surgery as per Dr. [name]. The plan is to wait for the cultures and if that is negative then empirically treat her with p.o. antibiotics either doxy plus Keflex for a month and see. Patient has a pacemaker in place  Paroxysmal A. fib on sotalol  Anemia Hypertension Discussed the management with the patient and her daughter ID will follow her peripherally this weekend call if needed.

## 2020-07-03 NOTE — Progress Notes (Addendum)
Subjective:  Patient reports pain as mild.  Objective:   VITALS:   Vitals:   07/03/20 0417 07/03/20 0744 07/03/20 0910 07/03/20 1147  BP: (!) 160/52 (!) 171/63 (!) 142/69 (!) 141/55  Pulse: 63 70 62 64  Resp: _0 Temp: 98 F (36.7 C) 98 F (36.7 C)  98 F (36.7 C)  TempSrc:  Oral  Oral  SpO2: 95% 97% 96% 94%  Weight:      Height:        PHYSICAL EXAM:  Neurologically intact ABD soft Neurovascular intact Sensation intact distally Intact pulses distally Dorsiflexion/Plantar flexion intact No cellulitis present Compartment soft  LABS  Results for orders placed or performed during the hospital encounter of 07/01/20 (from the past 24 hour(s))  Culture, blood (Routine X 2) w Reflex to ID Panel     Status: None (Preliminary result)   Collection Time: 07/02/20  4:24 PM   Specimen: BLOOD  Result Value Ref Range   Specimen Description BLOOD RIGHT ANTECUBITAL    Special Requests      BOTTLES DRAWN AEROBIC AND ANAEROBIC Blood Culture adequate volume   Culture      NO GROWTH < 24 HOURS Performed at [facility], [address] [place], [place] [address]    Report Status PENDING   Culture, blood (Routine X 2) w Reflex to ID Panel     Status: None (Preliminary result)   Collection Time: 07/02/20  4:24 PM   Specimen: BLOOD  Result Value Ref Range   Specimen Description BLOOD LEFT ANTECUBITAL    Special Requests      BOTTLES DRAWN AEROBIC AND ANAEROBIC Blood Culture adequate volume   Culture      NO GROWTH < 24 HOURS Performed at [facility], [address] [place], [place] [address]    Report Status PENDING     DG FLUORO GUIDED NEEDLE [facility] ASPIRATION/INJECTION LOC  Result Date: 07/03/2020 CLINICAL DATA:  History of prosthetic hip with hip pain. EXAM: RIGHT HIP ASPIRATION UNDER FLUOROSCOPY COMPARISON:  CT OF THE SAME DATE. FLUOROSCOPY TIME:  Fluoroscopy Time:  30 SECONDS Radiation Exposure Index (if provided by the fluoroscopic device):  5.1 mGy Number of Acquired Spot Images: 0 PROCEDURE: Consent was obtained, time-out was performed to verify site and side of procedure. The patient's RIGHT anterior thigh was prepped and draped in the usual sterile fashion, numbing with 15 cc of lidocaine was performed. First with a 20 gauge needle and subsequently with an 18 gauge needle attempts to aspirate this area were made without success. A small amount of saline was flushed through the needle and additional attempts were made to aspirate yielding a scant amount of material and a small amount of blood. Sample was sent to the lab for requested testing. IMPRESSION: Attempted aspiration of the fluid density area along the anterior margin of the RIGHT proximal femur, associated with bony resorption. This yielded scant material, material sent for requested testing. Electronically Signed   By: Zetta Bills M.D.   On: 07/03/2020 10:37   DG HIP UNILAT WITH PELVIS 2-3 VIEWS RIGHT  Result Date: 07/02/2020 CLINICAL DATA:  Acute right hip pain EXAM: DG HIP (WITH OR WITHOUT PELVIS) 2-3V RIGHT COMPARISON:  12/02/2014 FINDINGS: Changes of prior right hip replacement. Lucency and bone destruction noted within the proximal femur in the region of the greater trochanter and proximal shaft. This is concerning for possible infection/osteomyelitis. This is new since prior study. Mild degenerative changes in the left  hip. Diffuse vascular calcifications. IMPRESSION: Prior right hip replacement. Lucency with cortical destruction noted in the proximal femur around the hip replacement stem concerning for infection/osteomyelitis. Electronically Signed   By: Rolm Baptise M.D.   On: 07/02/2020 09:10    Assessment/Plan:     Principal Problem:   Acute right hip pain Active Problems:   Moderate persistent asthma   Essential hypertension   GAD (generalized anxiety disorder)   Paroxysmal atrial fibrillation (HCC)   Chronic diastolic CHF (congestive heart failure) (HCC)    Malnutrition of moderate degree  Patient with significant pain and mildly elevated ESR. No evidence of fracture. There is stress shielding of the distal implant they may cause osteolysis of the lateral femur. Would be prudent to rule out infection with an aspiration. She is a poor surgical candidate and would prefer non-operative management. Next steps will depend on her aspiration results. Would recommend protected weight bearing, suppressive antibiotic coverage if necessary and pain control. Will follow. Please call with questions.  Dr. [name] to discuss with daughter  Carlynn Spry , Hershal Coria 07/03/2020, 2:19 PM  Discussed case with patient, daughter and Dr. Delaine Lame. Will follow aspiration results. Start PT with [facility] only. Will need SNF placement. No intervention planned at this time.

## 2020-07-03 NOTE — TOC Progression Note (Addendum)
Transition of Care [facility]) - Progression Note    Patient Details  Name: Michele Summers MRN: [id number] Date of Birth: Jan 23, 1932  Transition of Care [facility]) CM/SW [place], LCSW Phone Number: 07/03/2020, 9:01 AM  Clinical Narrative:   Updated patient's daughter that [facility] can accept patient when medically ready, earliest they will have an open bed is Monday. Per MD patient is not medically ready for DC.    Expected Discharge Plan: [place] Barriers to Discharge: Continued Medical Work up  Expected Discharge Plan and Services Expected Discharge Plan: [place] arrangements for the past 2 months: Single Family Home                                       Social Determinants of Health (SDOH) Interventions    Readmission Risk Interventions No flowsheet data found.

## 2020-07-03 NOTE — Progress Notes (Signed)
PROGRESS NOTE    Michele Summers  IWL:[id number] DOB: November 24, 1931 DOA: 07/01/2020 PCP: [name], MD    Brief Narrative:  Michele Summers is a 87 y.o. female with medical history significant for right hip fracture in 2013 status post right total hip arthroplasty, paroxysmal atrial fibrillation complicated by sick sinus syndrome status post pacemaker placement, hypertension, chronic diastolic heart failure, generalized anxiety disorder, moderate persistent asthma, who is admitted to [facility] on 07/01/2020 with acute right hip pain after presenting to [facility] ED complaining of right hip pain.  She confirms a history of right hip fracture back in 2013, at which time she underwent total right hip arthroplasty without any reported ensuing complications from this procedure  Addendum: 2/17-Per Dr. [name] via messaging, need to have IR aspirate rt hip for gram stain and culture r/o infection as xray suggestive.  2/18-plan for aspiration today by IR.     Consultants:   Orthopedics, ID  Procedures:   Antimicrobials:       Subjective: Has no complaints currently with hip pain as she was given pain meds. No other complaints  Objective: Vitals:   07/02/20 2043 07/03/20 0014 07/03/20 0417 07/03/20 0744  BP: (!) 168/63 (!) 135/53 (!) 160/52 (!) 171/63  Pulse: 81 64 63 70  Resp: '16 16 18 17  ' Temp: 97.6 F (36.4 C) 98 F (36.7 C) 98 F (36.7 C) 98 F (36.7 C)  TempSrc: Oral   Oral  SpO2: 96% 95% 95% 97%  Weight:      Height:        Intake/Output Summary (Last 24 hours) at 07/03/2020 0820 Last data filed at 07/03/2020 0423 Gross per 24 hour  Intake 240 ml  Output 1000 ml  Net -760 ml   Filed Weights   07/01/20 1219 07/01/20 2353 07/02/20 0443  Weight: 63.5 kg 66.4 kg 67 kg    Examination: Calm, comfortable cta no w/r/r Regular s1/s2 Soft benign, +bs No edema Awake, alert, grossly intact     Data Reviewed: I have personally reviewed following labs and imaging  studies  CBC: Recent Labs  Lab 07/01/20 1857 07/02/20 0417  WBC 5.2 6.5  HGB 11.0* 10.7*  HCT 34.8* 33.4*  MCV 82.9 82.5  PLT 350 174   Basic Metabolic Panel: Recent Labs  Lab 07/01/20 1857 07/02/20 0417  NA 136 136  K 4.3 4.0  CL 104 104  CO2 22 24  GLUCOSE 244* 204*  BUN 16 18  CREATININE 0.80 0.92  CALCIUM 8.8* 8.9  MG 1.9 1.9   GFR: Estimated Creatinine Clearance: 41.3 mL/min (by C-G formula based on SCr of 0.92 mg/dL). Liver Function Tests: No results for input(s): AST, ALT, ALKPHOS, BILITOT, PROT, ALBUMIN in the last 168 hours. No results for input(s): LIPASE, AMYLASE in the last 168 hours. No results for input(s): AMMONIA in the last 168 hours. Coagulation Profile: No results for input(s): INR, PROTIME in the last 168 hours. Cardiac Enzymes: No results for input(s): CKTOTAL, CKMB, CKMBINDEX, TROPONINI in the last 168 hours. BNP (last 3 results) No results for input(s): PROBNP in the last 8760 hours. HbA1C: No results for input(s): HGBA1C in the last 72 hours. CBG: No results for input(s): GLUCAP in the last 168 hours. Lipid Profile: No results for input(s): CHOL, HDL, LDLCALC, TRIG, CHOLHDL, LDLDIRECT in the last 72 hours. Thyroid Function Tests: No results for input(s): TSH, T4TOTAL, FREET4, T3FREE, THYROIDAB in the last 72 hours. Anemia Panel: No results for input(s): VITAMINB12, FOLATE, FERRITIN, TIBC,  IRON, RETICCTPCT in the last 72 hours. Sepsis Labs: No results for input(s): PROCALCITON, LATICACIDVEN in the last 168 hours.  Recent Results (from the past 240 hour(s))  Resp Panel by RT-PCR (Flu A&B, Covid) Nasopharyngeal Swab     Status: None   Collection Time: 07/01/20  4:45 PM   Specimen: Nasopharyngeal Swab; Nasopharyngeal(NP) swabs in vial transport medium  Result Value Ref Range Status   SARS Coronavirus 2 by RT PCR NEGATIVE NEGATIVE Final    Comment: (NOTE) SARS-CoV-2 target nucleic acids are NOT DETECTED.  The SARS-CoV-2 RNA is generally  detectable in upper respiratory specimens during the acute phase of infection. The lowest concentration of SARS-CoV-2 viral copies this assay can detect is 138 copies/mL. A negative result does not preclude SARS-Cov-2 infection and should not be used as the sole basis for treatment or other patient management decisions. A negative result may occur with  improper specimen collection/handling, submission of specimen other than nasopharyngeal swab, presence of viral mutation(s) within the areas targeted by this assay, and inadequate number of viral copies(<138 copies/mL). A negative result must be combined with clinical observations, patient history, and epidemiological information. The expected result is Negative.  Fact Sheet for Patients:  EntrepreneurPulse.com.au  Fact Sheet for Healthcare Providers:  IncredibleEmployment.be  This test is no t yet approved or cleared by the [place] FDA and  has been authorized for detection and/or diagnosis of SARS-CoV-2 by FDA under an Emergency Use Authorization (EUA). This EUA will remain  in effect (meaning this test can be used) for the duration of the COVID-19 declaration under Section 564(b)(1) of the Act, 21 U.S.C.section 360bbb-3(b)(1), unless the authorization is terminated  or revoked sooner.       Influenza A by PCR NEGATIVE NEGATIVE Final   Influenza B by PCR NEGATIVE NEGATIVE Final    Comment: (NOTE) The Xpert Xpress SARS-CoV-2/FLU/RSV plus assay is intended as an aid in the diagnosis of influenza from Nasopharyngeal swab specimens and should not be used as a sole basis for treatment. Nasal washings and aspirates are unacceptable for Xpert Xpress SARS-CoV-2/FLU/RSV testing.  Fact Sheet for Patients: EntrepreneurPulse.com.au  Fact Sheet for Healthcare Providers: IncredibleEmployment.be  This test is not yet approved or cleared by the [place] FDA  and has been authorized for detection and/or diagnosis of SARS-CoV-2 by FDA under an Emergency Use Authorization (EUA). This EUA will remain in effect (meaning this test can be used) for the duration of the COVID-19 declaration under Section 564(b)(1) of the Act, 21 U.S.C. section 360bbb-3(b)(1), unless the authorization is terminated or revoked.  Performed at [facility], [address] [place], [place] [address]   Culture, blood (Routine X 2) w Reflex to ID Panel     Status: None (Preliminary result)   Collection Time: 07/02/20  4:24 PM   Specimen: BLOOD  Result Value Ref Range Status   Specimen Description BLOOD RIGHT ANTECUBITAL  Final   Special Requests   Final    BOTTLES DRAWN AEROBIC AND ANAEROBIC Blood Culture adequate volume   Culture   Final    NO GROWTH < 24 HOURS Performed at [facility], [address] [place], [place] [address]    Report Status PENDING  Incomplete  Culture, blood (Routine X 2) w Reflex to ID Panel     Status: None (Preliminary result)   Collection Time: 07/02/20  4:24 PM   Specimen: BLOOD  Result Value Ref Range Status   Specimen Description BLOOD LEFT ANTECUBITAL  Final  Special Requests   Final    BOTTLES DRAWN AEROBIC AND ANAEROBIC Blood Culture adequate volume   Culture   Final    NO GROWTH < 24 HOURS Performed at [facility], [address] [place], [place] [address]    Report Status PENDING  Incomplete         Radiology Studies: CT Hip Right Wo Contrast  Result Date: 07/01/2020 CLINICAL DATA:  Worsening right hip pain over the past 2-3 weeks. History of prior right hip arthroplasty. No known injury. EXAM: CT OF THE RIGHT HIP WITHOUT CONTRAST TECHNIQUE: Multidetector CT imaging of the right hip was performed according to the standard protocol. Multiplanar CT image reconstructions were also generated. COMPARISON:  Plain films right femur 11/02/2014. FINDINGS: Bones/Joint/Cartilage Right total hip  arthroplasty is in place and results in streak artifact on the study which limits evaluation, particularly of the proximal femur. The device is located. No evidence of loosening or other hardware complication is identified. No focal bone lesion is seen. Ligaments Suboptimally assessed by CT. Muscles and Tendons Intact and normal in appearance. Soft tissues Atherosclerotic vascular disease noted. The patient is status post hysterectomy. IMPRESSION: No acute abnormality. Right hip arthroplasty results in fairly extensive streak artifact on the examination, particularly about the proximal femur. Recommend correlation with plain films of the hip if there is concern for a periprosthetic proximal femur fracture. Electronically Signed   By: Inge Rise M.D.   On: 07/01/2020 13:23   DG HIP UNILAT WITH PELVIS 2-3 VIEWS RIGHT  Result Date: 07/02/2020 CLINICAL DATA:  Acute right hip pain EXAM: DG HIP (WITH OR WITHOUT PELVIS) 2-3V RIGHT COMPARISON:  12/02/2014 FINDINGS: Changes of prior right hip replacement. Lucency and bone destruction noted within the proximal femur in the region of the greater trochanter and proximal shaft. This is concerning for possible infection/osteomyelitis. This is new since prior study. Mild degenerative changes in the left hip. Diffuse vascular calcifications. IMPRESSION: Prior right hip replacement. Lucency with cortical destruction noted in the proximal femur around the hip replacement stem concerning for infection/osteomyelitis. Electronically Signed   By: Rolm Baptise M.D.   On: 07/02/2020 09:10        Scheduled Meds: . brimonidine  1 drop Both Eyes Q12H   And  . timolol  1 drop Both Eyes Q12H  . feeding supplement  237 mL Oral BID BM  . FLUoxetine  40 mg Oral Daily  . fluticasone furoate-vilanterol  1 puff Inhalation Daily  . lidocaine  1 patch Transdermal Q24H  . montelukast  10 mg Oral Daily  . moxifloxacin  1 drop Both Eyes TID  . multivitamin with minerals  1  tablet Oral Daily  . nepafenac  1 drop Both Eyes Daily  . Netarsudil-Latanoprost  1 drop Both Eyes QPM  . sotalol  80 mg Oral Daily   Continuous Infusions:  Assessment & Plan:   Principal Problem:   Acute right hip pain Active Problems:   Moderate persistent asthma   Essential hypertension   GAD (generalized anxiety disorder)   Paroxysmal atrial fibrillation (HCC)   Chronic diastolic CHF (congestive heart failure) (HCC)   Malnutrition of moderate degree   #) Acute right hip pain:  causing a decline in ambulatory activity . Ortho consulted-input was appreciated-no evidence of fracture. There is stress shielding of the distal implant that may cause osteolysis of the lateral femur Need to rule out infection/osteomyelitis She is a poor surgical candidate and would prefer nonoperative management ID consulted input  was appreciated-x-ray with cortical destruction with questionable infection/osteomyelitis ESR minimally elevated, CRP is 3.  No leukocytosis and afebrile Hold off on abx until aspiration is done Ck bcx If unable to do aspiration successfully, ID rec. Bone bx for cx and path PT-rec. SNF    #) Chronic diastolic failure: Documented history of such, with most recent echocardiogram in January 2013 showing normal left ventricular cavity size, LVEF 60 to 65% 2/18-compensated, no exacerbation I/o    #) Paroxysmal atrial fibrillation: stable. Pt on Eliquis, held for aspiration. Will resume when cleared by IR.    #) Essential hypertension: stable Outpatient medications include Norvasc, hydralazine, losartan.   MEds held as bp stable.  Continue to monitor, if need to will resume norvasc.         #) Generalized anxiety disorder: Continue Prozac and Xanax as needed at home         #) Moderate persistent asthma: Outpatient respiratory regimen consists of scheduled Advair, Singulair.  No clinical evidence to suggest acute exacerbation.       DVT prophylaxis: scd Code Status: Full Family Communication: Daughter at bedside  Status is: Inpatient  Remains inpatient appropriate because:Inpatient level of care appropriate due to severity of illness   Dispo: The patient is from: Home              Anticipated d/c is to: SNF              Anticipated d/c date is: 2 days              Patient currently is not medically stable to d/c.w/u pending.    Difficult to place patient No            LOS: 2 days   Time spent: 35 min with >50% on coc    Nolberto Hanlon, MD [facility] Pager [phone]  If 7PM-7AM, please contact night-coverage 07/03/2020, 8:20 AM

## 2020-07-03 NOTE — Care Management Important Message (Signed)
Important Message  Patient Details  Name: TENNIE GRUSSING MRN: [id number] Date of Birth: 11-Apr-1932   Medicare Important Message Given:  N/A - LOS <3 / Initial given by admissions     Juliann Pulse A Gaile Allmon 07/03/2020, 8:47 AM

## 2020-07-04 ENCOUNTER — Inpatient Hospital Stay: Payer: Medicare Other

## 2020-07-04 DIAGNOSIS — M25551 Pain in right hip: Secondary | ICD-10-CM | POA: Diagnosis not present

## 2020-07-04 LAB — CBC
HCT: 32.9 % — ABNORMAL LOW (ref 36.0–46.0)
Hemoglobin: 10.6 g/dL — ABNORMAL LOW (ref 12.0–15.0)
MCH: 26.3 pg (ref 26.0–34.0)
MCHC: 32.2 g/dL (ref 30.0–36.0)
MCV: 81.6 fL (ref 80.0–100.0)
Platelets: 369 10*3/uL (ref 150–400)
RBC: 4.03 MIL/uL (ref 3.87–5.11)
RDW: 13.8 % (ref 11.5–15.5)
WBC: 8.8 10*3/uL (ref 4.0–10.5)
nRBC: 0 % (ref 0.0–0.2)

## 2020-07-04 LAB — PROTIME-INR
INR: 1.1 (ref 0.8–1.2)
Prothrombin Time: 13.9 seconds (ref 11.4–15.2)

## 2020-07-04 LAB — CREATININE, SERUM
Creatinine, Ser: 0.74 mg/dL (ref 0.44–1.00)
GFR, Estimated: >60 mL/min (ref >60)

## 2020-07-04 MED ORDER — ENOXAPARIN SODIUM 80 MG/0.8ML ~~LOC~~ SOLN
1.0000 mg/kg | Freq: Two times a day (BID) | SUBCUTANEOUS | Status: DC
  Administered 2020-07-04 – 2020-07-06 (×4): 67.5 mg via SUBCUTANEOUS
  Filled 2020-07-04 (×5): qty 0.8

## 2020-07-04 NOTE — Progress Notes (Signed)
PROGRESS NOTE    Michele Summers  VVO:[id number] DOB: 1932-05-14 DOA: 07/01/2020 PCP: [name], MD    Brief Narrative:  Michele Summers is a 87 y.o. female with medical history significant for right hip fracture in 2013 status post right total hip arthroplasty, paroxysmal atrial fibrillation complicated by sick sinus syndrome status post pacemaker placement, hypertension, chronic diastolic heart failure, generalized anxiety disorder, moderate persistent asthma, who is admitted to [facility] on 07/01/2020 with acute right hip pain after presenting to [facility] ED complaining of right hip pain.  She confirms a history of right hip fracture back in 2013, at which time she underwent total right hip arthroplasty without any reported ensuing complications from this procedure  Addendum: 2/17-Per Dr. [name] via messaging, need to have IR aspirate rt hip for gram stain and culture r/o infection as xray suggestive.  2/18-plan for aspiration today by IR.   2/19-spoke to Dr. [name] radiology he reported we should obtain US of anterior hip to r/o liquid or mass, if this was negative then consider MR for further evalatuion  Consultants:   Orthopedics, ID,IR  Procedures:   Antimicrobials:       Subjective: No complaints this am. No sob, cp, abd pain  Objective: Vitals:   07/03/20 1944 07/04/20 0006 07/04/20 0539 07/04/20 0812  BP: (!) 147/56 (!) 123/54 (!) 164/54 (!) 148/61  Pulse: 64 94 (!) 47 72  Resp: _0 Temp: 98.4 F (36.9 C) 98.1 F (36.7 C) 98.9 F (37.2 C) 98.6 F (37 C)  TempSrc:      SpO2: 96% 94% 94% 95%  Weight:   68 kg   Height:        Intake/Output Summary (Last 24 hours) at 07/04/2020 1331 Last data filed at 07/04/2020 0500 Gross per 24 hour  Intake --  Output 600 ml  Net -600 ml   Filed Weights   07/01/20 2353 07/02/20 0443 07/04/20 0539  Weight: 66.4 kg 67 kg 68 kg    Examination: Calm,nad cta no w/r/r rrr s1/s2 Soft benign,+bs No edema Awake and  alert      Data Reviewed: I have personally reviewed following labs and imaging studies  CBC: Recent Labs  Lab 07/01/20 1857 07/02/20 0417  WBC 5.2 6.5  HGB 11.0* 10.7*  HCT 34.8* 33.4*  MCV 82.9 82.5  PLT 350 269   Basic Metabolic Panel: Recent Labs  Lab 07/01/20 1857 07/02/20 0417  NA 136 136  K 4.3 4.0  CL 104 104  CO2 22 24  GLUCOSE 244* 204*  BUN 16 18  CREATININE 0.80 0.92  CALCIUM 8.8* 8.9  MG 1.9 1.9   GFR: Estimated Creatinine Clearance: 41.6 mL/min (by C-G formula based on SCr of 0.92 mg/dL). Liver Function Tests: No results for input(s): AST, ALT, ALKPHOS, BILITOT, PROT, ALBUMIN in the last 168 hours. No results for input(s): LIPASE, AMYLASE in the last 168 hours. No results for input(s): AMMONIA in the last 168 hours. Coagulation Profile: No results for input(s): INR, PROTIME in the last 168 hours. Cardiac Enzymes: No results for input(s): CKTOTAL, CKMB, CKMBINDEX, TROPONINI in the last 168 hours. BNP (last 3 results) No results for input(s): PROBNP in the last 8760 hours. HbA1C: No results for input(s): HGBA1C in the last 72 hours. CBG: No results for input(s): GLUCAP in the last 168 hours. Lipid Profile: No results for input(s): CHOL, HDL, LDLCALC, TRIG, CHOLHDL, LDLDIRECT in the last 72 hours. Thyroid Function Tests: No results for input(s):  TSH, T4TOTAL, FREET4, T3FREE, THYROIDAB in the last 72 hours. Anemia Panel: No results for input(s): VITAMINB12, FOLATE, FERRITIN, TIBC, IRON, RETICCTPCT in the last 72 hours. Sepsis Labs: No results for input(s): PROCALCITON, LATICACIDVEN in the last 168 hours.  Recent Results (from the past 240 hour(s))  Resp Panel by RT-PCR (Flu A&B, Covid) Nasopharyngeal Swab     Status: None   Collection Time: 07/01/20  4:45 PM   Specimen: Nasopharyngeal Swab; Nasopharyngeal(NP) swabs in vial transport medium  Result Value Ref Range Status   SARS Coronavirus 2 by RT PCR NEGATIVE NEGATIVE Final    Comment:  (NOTE) SARS-CoV-2 target nucleic acids are NOT DETECTED.  The SARS-CoV-2 RNA is generally detectable in upper respiratory specimens during the acute phase of infection. The lowest concentration of SARS-CoV-2 viral copies this assay can detect is 138 copies/mL. A negative result does not preclude SARS-Cov-2 infection and should not be used as the sole basis for treatment or other patient management decisions. A negative result may occur with  improper specimen collection/handling, submission of specimen other than nasopharyngeal swab, presence of viral mutation(s) within the areas targeted by this assay, and inadequate number of viral copies(<138 copies/mL). A negative result must be combined with clinical observations, patient history, and epidemiological information. The expected result is Negative.  Fact Sheet for Patients:  EntrepreneurPulse.com.au  Fact Sheet for Healthcare Providers:  IncredibleEmployment.be  This test is no t yet approved or cleared by the [place] FDA and  has been authorized for detection and/or diagnosis of SARS-CoV-2 by FDA under an Emergency Use Authorization (EUA). This EUA will remain  in effect (meaning this test can be used) for the duration of the COVID-19 declaration under Section 564(b)(1) of the Act, 21 U.S.C.section 360bbb-3(b)(1), unless the authorization is terminated  or revoked sooner.       Influenza A by PCR NEGATIVE NEGATIVE Final   Influenza B by PCR NEGATIVE NEGATIVE Final    Comment: (NOTE) The Xpert Xpress SARS-CoV-2/FLU/RSV plus assay is intended as an aid in the diagnosis of influenza from Nasopharyngeal swab specimens and should not be used as a sole basis for treatment. Nasal washings and aspirates are unacceptable for Xpert Xpress SARS-CoV-2/FLU/RSV testing.  Fact Sheet for Patients: EntrepreneurPulse.com.au  Fact Sheet for Healthcare  Providers: IncredibleEmployment.be  This test is not yet approved or cleared by the [place] FDA and has been authorized for detection and/or diagnosis of SARS-CoV-2 by FDA under an Emergency Use Authorization (EUA). This EUA will remain in effect (meaning this test can be used) for the duration of the COVID-19 declaration under Section 564(b)(1) of the Act, 21 U.S.C. section 360bbb-3(b)(1), unless the authorization is terminated or revoked.  Performed at [facility], [address] [place], [place] [address]   Culture, blood (Routine X 2) w Reflex to ID Panel     Status: None (Preliminary result)   Collection Time: 07/02/20  4:24 PM   Specimen: BLOOD  Result Value Ref Range Status   Specimen Description BLOOD RIGHT ANTECUBITAL  Final   Special Requests   Final    BOTTLES DRAWN AEROBIC AND ANAEROBIC Blood Culture adequate volume   Culture   Final    NO GROWTH 2 DAYS Performed at [facility], [address] [place], [place] [address]    Report Status PENDING  Incomplete  Culture, blood (Routine X 2) w Reflex to ID Panel     Status: None (Preliminary result)   Collection Time: 07/02/20  4:24 PM  Specimen: BLOOD  Result Value Ref Range Status   Specimen Description BLOOD LEFT ANTECUBITAL  Final   Special Requests   Final    BOTTLES DRAWN AEROBIC AND ANAEROBIC Blood Culture adequate volume   Culture   Final    NO GROWTH 2 DAYS Performed at [facility], [address] [place], [place] [address]    Report Status PENDING  Incomplete  Body fluid culture w Gram Stain     Status: None (Preliminary result)   Collection Time: 07/03/20 10:16 AM   Specimen: PATH Cytology Misc. fluid; Synovial Fluid  Result Value Ref Range Status   Specimen Description   Final    SYNOVIAL Performed at [facility], [address], [place], [place] [address]    Special Requests   Final    SYNOVIAL Performed at [facility],  [address] [place], [place] [address]    Gram Stain PENDING  Incomplete   Culture   Final    NO GROWTH < 24 HOURS Performed at [facility], [place] [address], [place], [place] [address]    Report Status PENDING  Incomplete         Radiology Studies: DG FLUORO GUIDED NEEDLE PLC ASPIRATION/INJECTION LOC  Result Date: 07/03/2020 CLINICAL DATA:  History of prosthetic hip with hip pain. EXAM: RIGHT HIP ASPIRATION UNDER FLUOROSCOPY COMPARISON:  CT OF THE SAME DATE. FLUOROSCOPY TIME:  Fluoroscopy Time:  30 SECONDS Radiation Exposure Index (if provided by the fluoroscopic device): 5.1 mGy Number of Acquired Spot Images: 0 PROCEDURE: Consent was obtained, time-out was performed to verify site and side of procedure. The patient's RIGHT anterior thigh was prepped and draped in the usual sterile fashion, numbing with 15 cc of lidocaine was performed. First with a 20 gauge needle and subsequently with an 18 gauge needle attempts to aspirate this area were made without success. A small amount of saline was flushed through the needle and additional attempts were made to aspirate yielding a scant amount of material and a small amount of blood. Sample was sent to the lab for requested testing. IMPRESSION: Attempted aspiration of the fluid density area along the anterior margin of the RIGHT proximal femur, associated with bony resorption. This yielded scant material, material sent for requested testing. Electronically Signed   By: Zetta Bills M.D.   On: 07/03/2020 10:37   [place] RT LOWER EXTREM LTD SOFT TISSUE NON VASCULAR  Result Date: 07/04/2020 CLINICAL DATA:  Right hip pain for 2 weeks. Question mass or fluid collection. EXAM: ULTRASOUND RIGHT LOWER EXTREMITY LIMITED TECHNIQUE: Ultrasound examination of the lower extremity soft tissues was performed in the area of clinical concern. COMPARISON:  CT right hip 07/02/2019. FINDINGS: Scanning was directed toward the region of concern anterior to the right  hip. There is a debris containing fluid collection measuring 12.7 x 5.5 x 5.9 cm. There is no flow within or about the collection on Doppler imaging. No other abnormality is identified. IMPRESSION: Debris containing fluid collection anterior to the proximal right femur may be due to a pseudotumor related to the patient's prosthesis or old hematoma. Abscess is possible but less likely given absence of surrounding blood flow. The collection should be amenable to aspiration under ultrasound. Electronically Signed   By: Inge Rise M.D.   On: 07/04/2020 11:54        Scheduled Meds: . brimonidine  1 drop Both Eyes Q12H   And  . timolol  1 drop Both Eyes Q12H  .  feeding supplement  237 mL Oral BID BM  . FLUoxetine  40 mg Oral Daily  . fluticasone furoate-vilanterol  1 puff Inhalation Daily  . lidocaine  1 patch Transdermal Q24H  . montelukast  10 mg Oral Daily  . moxifloxacin  1 drop Both Eyes TID  . multivitamin with minerals  1 tablet Oral Daily  . nepafenac  1 drop Both Eyes Daily  . Netarsudil-Latanoprost  1 drop Both Eyes QPM  . sotalol  80 mg Oral Daily   Continuous Infusions:  Assessment & Plan:   Principal Problem:   Acute right hip pain Active Problems:   Moderate persistent asthma   Essential hypertension   GAD (generalized anxiety disorder)   Paroxysmal atrial fibrillation (HCC)   Chronic diastolic CHF (congestive heart failure) (HCC)   Malnutrition of moderate degree   #) Acute right hip pain:  causing a decline in ambulatory activity . Ortho consulted-input was appreciated-no evidence of fracture. There is stress shielding of the distal implant that may cause osteolysis of the lateral femur Need to rule out infection/osteomyelitis She is a poor surgical candidate and would prefer nonoperative management ID consulted input was appreciated-x-ray with cortical destruction with questionable infection/osteomyelitis ESR minimally elevated, CRP is 3.  No leukocytosis  and afebrile 2/19-s/p Hip aspiration and cx sent by IR on 2/18.  cx pending paln to wait for cx and if negative then consider empiric treatment with po abx either doxy plus keflex for a month Per IR, need Rt hip [place] to ck anterior hip for mass v.s. fluid, if negative, should get MR, if negative then bone bx.  PT rec. SNF     #) Chronic diastolic failure: Documented history of such, with most recent echocardiogram in January 2013 showing normal left ventricular cavity size, LVEF 60 to 65% 2/19-compensated, no acute exacerbatin I/O    #) Paroxysmal atrial fibrillation: stable. On Eliquis , held for aspiration Spoke to IR Dr. Earleen Newport ok'd to resume. I will place her on lovenox for now incase there is any planned procedures in near future.     #) Essential hypertension: stable Mildly elevated  outpatient medication including Norvasc, hydralazine, losartan  Will resume norvasc           #) Generalized anxiety disorder: Continue Prozac and Xanax as needed at home         #) Moderate persistent asthma: Outpatient respiratory regimen consists of scheduled Advair, Singulair.  No clinical evidence to suggest acute exacerbation.      DVT prophylaxis: scd..lovenox Code Status: Full Family Communication: son on the phone while I was in the room  Status is: Inpatient  Remains inpatient appropriate because:Inpatient level of care appropriate due to severity of illness   Dispo: The patient is from: Home              Anticipated d/c is to: SNF              Anticipated d/c date is: 2 days              Patient currently is not medically stable to d/c.w/u pending.    Difficult to place patient No            LOS: 3 days   Time spent: 35 min with >50% on coc    Nolberto Hanlon, MD [facility] Pager [phone]  If 7PM-7AM, please contact night-coverage 07/04/2020, 1:31 PM

## 2020-07-04 NOTE — Consult Note (Signed)
ANTICOAGULATION CONSULT NOTE - Initial Consult  Pharmacy Consult for Enoxaparin Indication: atrial fibrillation  Allergies  Allergen Reactions  . Aminoglycosides   . Bee Pollen   . Sulfa Antibiotics Itching  . Tramadol     Makes her feel "crazy"  . Morphine And Related Other (See Comments)    unknown    Patient Measurements: Height: 5\' 4"  (162.6 cm) Weight: 68 kg (149 lb 14.6 oz) IBW/kg (Calculated) : 54.7  Vital Signs: Temp: 98.6 F (37 C) (02/19 0812) BP: 148/61 (02/19 0812) Pulse Rate: 72 (02/19 0812)  Labs: Recent Labs    07/01/20 1857 07/02/20 0417  HGB 11.0* 10.7*  HCT 34.8* 33.4*  PLT 350 328  CREATININE 0.80 0.92    Estimated Creatinine Clearance: 41.6 mL/min (by C-G formula based on SCr of 0.92 mg/dL).   Medical History: Past Medical History:  Diagnosis Date  . Arthritis    Hands, feet, jaw  . Asthma   . CHF (congestive heart failure) ([place])   . Depression   . DVT (deep venous thrombosis) ([place])   . Dysrhythmia    A-FIB  . Fracture of hip ([place])    right  . Full dentures    UPPER AND LOWER  . Headache(784.0)   . Hypercholesterolemia   . Hypertension    CONTROLLED ON MEDS  . PE (pulmonary embolism)   . Presence of permanent cardiac pacemaker   . Stroke (cerebrum) (HCC)    DIFFICULT FOR HER TO COMPLETE HER SENTENCES,RESPONDS SLOWLY  . Stroke [facility]) 2006   CVA-CEREBRAL ANEURYSM  . UTI (urinary tract infection)    FREQUENT  . Wears dentures    full upper and lower    Medications:  Eliquis  Assessment: 87 y.o.female with history significantparoxysmal atrial fibrillation complicated by sick sinus syndrome status post pacemaker placement on Eliquis pta (dose unclear)   Goal of Therapy:   Monitor platelets by anticoagulation protocol: Yes   Plan:  Lovenox 1mg /kg q12h Will obtain baseline INR, CBC, Scr  Lu Duffel, PharmD, BCPS Clinical Pharmacist 07/04/2020 2:00 PM

## 2020-07-04 NOTE — Progress Notes (Signed)
Physical Therapy Treatment Patient Details Name: Michele Summers MRN: [id number] DOB: 03/03/1932 Today's Date: 07/04/2020    History of Present Illness Pt is an 87 yo female arrived at the ED with R hip pain and difficulty walking.  Per pt and daughter present at bedside the pt had imaging done at [facility] over a weak ago with no signs of fracture.  Per patient's daughter they were told the pain was likely from bursitis.  PMH includes R THA 2013, CHF, PE, and CVA.    PT Comments    Pt was pleasant and motivated to participate during the session and put forth good effort throughout.  Pt required min A with bed mobility tasks to manage the RLE and then max A to come to standing with heavy mult-modal cues for WB compliance.  Pt was able to take 1-2 very small hop-to steps at the EOB before needing to return to sitting secondary to fatigue.  Pt will benefit from PT services in a SNF setting upon discharge to safely address deficits listed in patient problem list for decreased caregiver assistance and eventual return to PLOF.     Follow Up Recommendations  SNF;Supervision for mobility/OOB     Equipment Recommendations  Rolling walker with 5" wheels;3in1 (PT)    Recommendations for Other Services       Precautions / Restrictions Precautions Precautions: Fall Restrictions Weight Bearing Restrictions: Yes RLE Weight Bearing: Touchdown weight bearing    Mobility  Bed Mobility Overal bed mobility: Needs Assistance Bed Mobility: Supine to Sit;Sit to Supine     Supine to sit: Min assist Sit to supine: Min assist   General bed mobility comments: Min A for RLE control    Transfers Overall transfer level: Needs assistance Equipment used: Rolling walker (2 wheeled) Transfers: Sit to/from Stand Sit to Stand: From elevated surface;Max assist         General transfer comment: Max A and mult-modal cues for sequencing and with pt's R foot placed on top of this PT's foot to ensure  TDWB compliance  Ambulation/Gait Ambulation/Gait assistance: Min assist Gait Distance (Feet): 1 Feet Assistive device: Rolling walker (2 wheeled)   Gait velocity: decreased   General Gait Details: Pt only able to take 1-2 very small hop-to steps at the EOB before becoming fatigued and needing to return to sitting; TDWB compliance maintained throughout the session   Stairs             Wheelchair Mobility    Modified Rankin (Stroke Patients Only)       Balance Overall balance assessment: Needs assistance   Sitting balance-Leahy Scale: Good       Standing balance-Leahy Scale: Fair Standing balance comment: Heavy lean on the RW for support                            Cognition Arousal/Alertness: Awake/alert Behavior During Therapy: WFL for tasks assessed/performed Overall Cognitive Status: Within Functional Limits for tasks assessed                                        Exercises Total Joint Exercises Ankle Circles/Pumps: Strengthening;Both;10 reps Quad Sets: Strengthening;Both;10 reps Gluteal Sets: Strengthening;Both;10 reps Heel Slides: AAROM;AROM;Both;10 reps;Strengthening (AAROM on the RLE) Hip ABduction/ADduction: AROM;AAROM;Both;Strengthening;10 reps (AAROM on the RLE) Straight Leg Raises: AROM;AAROM;Strengthening;Both;10 reps (AAROM on the RLE) Long Arc Quad:  AROM;Strengthening;Both;15 reps;10 reps Knee Flexion: AROM;Strengthening;Both;10 reps;15 reps Marching in Standing: AROM;Strengthening;Right;5 reps;Standing    General Comments        Pertinent Vitals/Pain Pain Assessment: 0-10 Pain Score: 7  Pain Location: R hip Pain Descriptors / Indicators: Sore;Aching Pain Intervention(s): Premedicated before session;Monitored during session    Home Living                      Prior Function            PT Goals (current goals can now be found in the care plan section) Progress towards PT goals: Not progressing  toward goals - comment (Limited by new TDWB status to the RLE)    Frequency    Min 2X/week      PT Plan Current plan remains appropriate    Co-evaluation              AM-PAC PT "6 Clicks" Mobility   Outcome Measure  Help needed turning from your back to your side while in a flat bed without using bedrails?: A Little Help needed moving from lying on your back to sitting on the side of a flat bed without using bedrails?: A Little Help needed moving to and from a bed to a chair (including a wheelchair)?: A Lot Help needed standing up from a chair using your arms (e.g., wheelchair or bedside chair)?: A Lot Help needed to walk in hospital room?: Total Help needed climbing 3-5 steps with a railing? : Total 6 Click Score: 12    End of Session Equipment Utilized During Treatment: Gait belt Activity Tolerance: Patient tolerated treatment well Patient left: in bed;with call bell/phone within reach;with bed alarm set Nurse Communication: Mobility status;Weight bearing status PT Visit Diagnosis: Difficulty in walking, not elsewhere classified (R26.2);Muscle weakness (generalized) (M62.81);Pain Pain - Right/Left: Right Pain - part of body: Hip     Time: 2979-8921 PT Time Calculation (min) (ACUTE ONLY): 26 min  Charges:  $Gait Training: 8-22 mins $Therapeutic Exercise: 8-22 mins                     D. Scott Sanaz Scarlett PT, DPT 07/04/20, 10:45 AM

## 2020-07-05 DIAGNOSIS — M25551 Pain in right hip: Secondary | ICD-10-CM | POA: Diagnosis not present

## 2020-07-05 MED ORDER — SENNOSIDES-DOCUSATE SODIUM 8.6-50 MG PO TABS
2.0000 | ORAL_TABLET | Freq: Two times a day (BID) | ORAL | Status: DC
  Administered 2020-07-05 – 2020-07-11 (×12): 2 via ORAL
  Filled 2020-07-05 (×13): qty 2

## 2020-07-05 MED ORDER — POLYETHYLENE GLYCOL 3350 17 G PO PACK
17.0000 g | PACK | Freq: Every day | ORAL | Status: DC
  Administered 2020-07-06 – 2020-07-11 (×2): 17 g via ORAL
  Filled 2020-07-05 (×4): qty 1

## 2020-07-05 MED ORDER — FLEET ENEMA 7-19 GM/118ML RE ENEM: 1.0000 | ENEMA | Freq: Every day | RECTAL | Status: DC | PRN

## 2020-07-05 MED ORDER — AMLODIPINE BESYLATE 5 MG PO TABS
2.5000 mg | ORAL_TABLET | Freq: Every day | ORAL | Status: DC
  Administered 2020-07-05 – 2020-07-06 (×2): 2.5 mg via ORAL
  Filled 2020-07-05 (×2): qty 1

## 2020-07-05 NOTE — Progress Notes (Signed)
PROGRESS NOTE    Michele Summers  OQH:[id number] DOB: 05-15-1932 DOA: 07/01/2020 PCP: [name], MD    Brief Narrative:  Michele Summers is a 87 y.o. female with medical history significant for right hip fracture in 2013 status post right total hip arthroplasty, paroxysmal atrial fibrillation complicated by sick sinus syndrome status post pacemaker placement, hypertension, chronic diastolic heart failure, generalized anxiety disorder, moderate persistent asthma, who is admitted to [facility] on 07/01/2020 with acute right hip pain after presenting to [facility] ED complaining of right hip pain.  She confirms a history of right hip fracture back in 2013, at which time she underwent total right hip arthroplasty without any reported ensuing complications from this procedure  Addendum: 2/17-Per Dr. [name] via messaging, need to have IR aspirate rt hip for gram stain and culture r/o infection as xray suggestive.  2/18-plan for aspiration today by IR.   2/19-spoke to Dr. [name] radiology he reported we should obtain US of anterior hip to r/o liquid or mass, if this was negative then consider MR for further evalatuion 2/20-rt hip anterior [place] completed. Send message to oncall ortho for review, if we need MR for further evaluation.   Consultants:   Orthopedics, ID,IR  Procedures:   Antimicrobials:       Subjective: Hip pain comes and goes. No other complaints.   Objective: Vitals:   07/05/20 0010 07/05/20 0446 07/05/20 0500 07/05/20 0745  BP: (!) 134/43 (!) 156/56  (!) 154/63  Pulse: 68 76  74  Resp: '16 16  15  ' Temp: 99.1 F (37.3 C) 98.3 F (36.8 C)  97.9 F (36.6 C)  TempSrc:      SpO2: 93% 94%  95%  Weight:   68.2 kg   Height:        Intake/Output Summary (Last 24 hours) at 07/05/2020 0837 Last data filed at 07/05/2020 0500 Gross per 24 hour  Intake --  Output 1000 ml  Net -1000 ml   Filed Weights   07/02/20 0443 07/04/20 0539 07/05/20 0500  Weight: 67 kg 68 kg 68.2 kg     Examination: Calm, nad cta no w/r/r Regular s1/s2 Soft benign, +bs No edema Awake, alert, grossly intact Mood and affect appropriate in current setting     Data Reviewed: I have personally reviewed following labs and imaging studies  CBC: Recent Labs  Lab 07/01/20 1857 07/02/20 0417 07/04/20 1413  WBC 5.2 6.5 8.8  HGB 11.0* 10.7* 10.6*  HCT 34.8* 33.4* 32.9*  MCV 82.9 82.5 81.6  PLT 350 328 546   Basic Metabolic Panel: Recent Labs  Lab 07/01/20 1857 07/02/20 0417 07/04/20 1413  NA 136 136  --   K 4.3 4.0  --   CL 104 104  --   CO2 22 24  --   GLUCOSE 244* 204*  --   BUN 16 18  --   CREATININE 0.80 0.92 0.74  CALCIUM 8.8* 8.9  --   MG 1.9 1.9  --    GFR: Estimated Creatinine Clearance: 47.9 mL/min (by C-G formula based on SCr of 0.74 mg/dL). Liver Function Tests: No results for input(s): AST, ALT, ALKPHOS, BILITOT, PROT, ALBUMIN in the last 168 hours. No results for input(s): LIPASE, AMYLASE in the last 168 hours. No results for input(s): AMMONIA in the last 168 hours. Coagulation Profile: Recent Labs  Lab 07/04/20 1413  INR 1.1   Cardiac Enzymes: No results for input(s): CKTOTAL, CKMB, CKMBINDEX, TROPONINI in the last 168 hours. BNP (last  3 results) No results for input(s): PROBNP in the last 8760 hours. HbA1C: No results for input(s): HGBA1C in the last 72 hours. CBG: No results for input(s): GLUCAP in the last 168 hours. Lipid Profile: No results for input(s): CHOL, HDL, LDLCALC, TRIG, CHOLHDL, LDLDIRECT in the last 72 hours. Thyroid Function Tests: No results for input(s): TSH, T4TOTAL, FREET4, T3FREE, THYROIDAB in the last 72 hours. Anemia Panel: No results for input(s): VITAMINB12, FOLATE, FERRITIN, TIBC, IRON, RETICCTPCT in the last 72 hours. Sepsis Labs: No results for input(s): PROCALCITON, LATICACIDVEN in the last 168 hours.  Recent Results (from the past 240 hour(s))  Resp Panel by RT-PCR (Flu A&B, Covid) Nasopharyngeal Swab      Status: None   Collection Time: 07/01/20  4:45 PM   Specimen: Nasopharyngeal Swab; Nasopharyngeal(NP) swabs in vial transport medium  Result Value Ref Range Status   SARS Coronavirus 2 by RT PCR NEGATIVE NEGATIVE Final    Comment: (NOTE) SARS-CoV-2 target nucleic acids are NOT DETECTED.  The SARS-CoV-2 RNA is generally detectable in upper respiratory specimens during the acute phase of infection. The lowest concentration of SARS-CoV-2 viral copies this assay can detect is 138 copies/mL. A negative result does not preclude SARS-Cov-2 infection and should not be used as the sole basis for treatment or other patient management decisions. A negative result may occur with  improper specimen collection/handling, submission of specimen other than nasopharyngeal swab, presence of viral mutation(s) within the areas targeted by this assay, and inadequate number of viral copies(<138 copies/mL). A negative result must be combined with clinical observations, patient history, and epidemiological information. The expected result is Negative.  Fact Sheet for Patients:  EntrepreneurPulse.com.au  Fact Sheet for Healthcare Providers:  IncredibleEmployment.be  This test is no t yet approved or cleared by the [place] FDA and  has been authorized for detection and/or diagnosis of SARS-CoV-2 by FDA under an Emergency Use Authorization (EUA). This EUA will remain  in effect (meaning this test can be used) for the duration of the COVID-19 declaration under Section 564(b)(1) of the Act, 21 U.S.C.section 360bbb-3(b)(1), unless the authorization is terminated  or revoked sooner.       Influenza A by PCR NEGATIVE NEGATIVE Final   Influenza B by PCR NEGATIVE NEGATIVE Final    Comment: (NOTE) The Xpert Xpress SARS-CoV-2/FLU/RSV plus assay is intended as an aid in the diagnosis of influenza from Nasopharyngeal swab specimens and should not be used as a sole basis  for treatment. Nasal washings and aspirates are unacceptable for Xpert Xpress SARS-CoV-2/FLU/RSV testing.  Fact Sheet for Patients: EntrepreneurPulse.com.au  Fact Sheet for Healthcare Providers: IncredibleEmployment.be  This test is not yet approved or cleared by the [place] FDA and has been authorized for detection and/or diagnosis of SARS-CoV-2 by FDA under an Emergency Use Authorization (EUA). This EUA will remain in effect (meaning this test can be used) for the duration of the COVID-19 declaration under Section 564(b)(1) of the Act, 21 U.S.C. section 360bbb-3(b)(1), unless the authorization is terminated or revoked.  Performed at [facility], [address] [place], [place] [address]   Culture, blood (Routine X 2) w Reflex to ID Panel     Status: None (Preliminary result)   Collection Time: 07/02/20  4:24 PM   Specimen: BLOOD  Result Value Ref Range Status   Specimen Description BLOOD RIGHT ANTECUBITAL  Final   Special Requests   Final    BOTTLES DRAWN AEROBIC AND ANAEROBIC Blood Culture adequate volume   Culture  Final    NO GROWTH 3 DAYS Performed at [facility], [address] [place], [place] [address]    Report Status PENDING  Incomplete  Culture, blood (Routine X 2) w Reflex to ID Panel     Status: None (Preliminary result)   Collection Time: 07/02/20  4:24 PM   Specimen: BLOOD  Result Value Ref Range Status   Specimen Description BLOOD LEFT ANTECUBITAL  Final   Special Requests   Final    BOTTLES DRAWN AEROBIC AND ANAEROBIC Blood Culture adequate volume   Culture   Final    NO GROWTH 3 DAYS Performed at [facility], [address], [place], [place] [address]    Report Status PENDING  Incomplete  Body fluid culture w Gram Stain     Status: None (Preliminary result)   Collection Time: 07/03/20 10:16 AM   Specimen: PATH Cytology Misc. fluid; Synovial Fluid  Result Value Ref Range  Status   Specimen Description   Final    SYNOVIAL Performed at [facility], [address] [place], [place] [address]    Special Requests   Final    SYNOVIAL Performed at [facility], [place], [place] [address]    Gram Stain NO WBC SEEN NO ORGANISMS SEEN   Final   Culture   Final    NO GROWTH < 24 HOURS Performed at [facility], [place] [address], [place], [place] [address]    Report Status PENDING  Incomplete         Radiology Studies: DG FLUORO GUIDED NEEDLE PLC ASPIRATION/INJECTION LOC  Result Date: 07/03/2020 CLINICAL DATA:  History of prosthetic hip with hip pain. EXAM: RIGHT HIP ASPIRATION UNDER FLUOROSCOPY COMPARISON:  CT OF THE SAME DATE. FLUOROSCOPY TIME:  Fluoroscopy Time:  30 SECONDS Radiation Exposure Index (if provided by the fluoroscopic device): 5.1 mGy Number of Acquired Spot Images: 0 PROCEDURE: Consent was obtained, time-out was performed to verify site and side of procedure. The patient's RIGHT anterior thigh was prepped and draped in the usual sterile fashion, numbing with 15 cc of lidocaine was performed. First with a 20 gauge needle and subsequently with an 18 gauge needle attempts to aspirate this area were made without success. A small amount of saline was flushed through the needle and additional attempts were made to aspirate yielding a scant amount of material and a small amount of blood. Sample was sent to the lab for requested testing. IMPRESSION: Attempted aspiration of the fluid density area along the anterior margin of the RIGHT proximal femur, associated with bony resorption. This yielded scant material, material sent for requested testing. Electronically Signed   By: Zetta Bills M.D.   On: 07/03/2020 10:37   [place] RT LOWER EXTREM LTD SOFT TISSUE NON VASCULAR  Result Date: 07/04/2020 CLINICAL DATA:  Right hip pain for 2 weeks. Question mass or fluid collection. EXAM: ULTRASOUND RIGHT LOWER EXTREMITY LIMITED  TECHNIQUE: Ultrasound examination of the lower extremity soft tissues was performed in the area of clinical concern. COMPARISON:  CT right hip 07/02/2019. FINDINGS: Scanning was directed toward the region of concern anterior to the right hip. There is a debris containing fluid collection measuring 12.7 x 5.5 x 5.9 cm. There is no flow within or about the collection on Doppler imaging. No other abnormality is identified. IMPRESSION: Debris containing fluid collection anterior to the proximal right femur may be due to a pseudotumor related to the patient's prosthesis or old hematoma. Abscess is  possible but less likely given absence of surrounding blood flow. The collection should be amenable to aspiration under ultrasound. Electronically Signed   By: Inge Rise M.D.   On: 07/04/2020 11:54        Scheduled Meds: . brimonidine  1 drop Both Eyes Q12H   And  . timolol  1 drop Both Eyes Q12H  . enoxaparin (LOVENOX) injection  1 mg/kg Subcutaneous Q12H  . feeding supplement  237 mL Oral BID BM  . FLUoxetine  40 mg Oral Daily  . fluticasone furoate-vilanterol  1 puff Inhalation Daily  . lidocaine  1 patch Transdermal Q24H  . montelukast  10 mg Oral Daily  . moxifloxacin  1 drop Both Eyes TID  . multivitamin with minerals  1 tablet Oral Daily  . nepafenac  1 drop Both Eyes Daily  . Netarsudil-Latanoprost  1 drop Both Eyes QPM  . sotalol  80 mg Oral Daily   Continuous Infusions:  Assessment & Plan:   Principal Problem:   Acute right hip pain Active Problems:   Moderate persistent asthma   Essential hypertension   GAD (generalized anxiety disorder)   Paroxysmal atrial fibrillation (HCC)   Chronic diastolic CHF (congestive heart failure) (HCC)   Malnutrition of moderate degree   #) Acute right hip pain:  causing a decline in ambulatory activity . Ortho consulted-input was appreciated-no evidence of fracture. There is stress shielding of the distal implant that may cause osteolysis  of the lateral femur Need to rule out infection/osteomyelitis She is a poor surgical candidate and would prefer nonoperative management ID consulted input was appreciated-x-ray with cortical destruction with questionable infection/osteomyelitis ESR minimally elevated, CRP is 3.  No leukocytosis and afebrile 2/19-s/p Hip aspiration and cx sent by IR on 2/18.  Per IR, need Rt hip [place] to ck anterior hip for mass v.s. fluid, if negative, should get MR, if negative then bone bx.  PT rec. SNF 2/20-anterior Rt hip [place] completed. Awaiting f/u response with ortho Dr. Mack Guise about next step, if pt needs MR or not, he will review and let me know. -For now cultures pending, per ID if negative then consider empiric treatment with p.o. antibiotics with doxycycline with plus Keflex for a month    #) Chronic diastolic failure: Documented history of such, with most recent echocardiogram in January 2013 showing normal left ventricular cavity size, LVEF 60 to 65% 2/20- compensated, no acute exacerbation Monitor I/o Daily weight     #) Paroxysmal atrial fibrillation: stable. On Eliquis , held for aspiration Spoke to IR Dr. Earleen Newport ok'd to resume. I will place her on lovenox for now incase there is any planned procedures in near future. 2/20-continue lovenox bid sq     #) Essential hypertension: stable Mildly elevated 2/20-will start amlodipine 2.5 mg po daily , increase as tolerated    #) Generalized anxiety disorder: Continue Prozac and Xanax as needed at home     #) Moderate persistent asthma: Outpatient respiratory regimen consists of scheduled Advair, Singulair.  No clinical evidence to suggest acute exacerbation.      DVT prophylaxis: lovenox Code Status: Full Family Communication:   Status is: Inpatient  Remains inpatient appropriate because:Inpatient level of care appropriate due to severity of illness   Dispo: The patient is from: Home              Anticipated  d/c is to: SNF              Anticipated d/c date is:  2 days              Patient currently is not medically stable to d/c.w/u pending.    Difficult to place patient No            LOS: 4 days   Time spent: 35 min with >50% on coc    Nolberto Hanlon, MD [facility] Pager [phone]  If 7PM-7AM, please contact night-coverage 07/05/2020, 8:37 AM

## 2020-07-06 DIAGNOSIS — M25551 Pain in right hip: Secondary | ICD-10-CM | POA: Diagnosis not present

## 2020-07-06 DIAGNOSIS — M79651 Pain in right thigh: Secondary | ICD-10-CM

## 2020-07-06 MED ORDER — AMLODIPINE BESYLATE 5 MG PO TABS
5.0000 mg | ORAL_TABLET | Freq: Every day | ORAL | Status: DC
  Administered 2020-07-07 – 2020-07-08 (×2): 5 mg via ORAL
  Filled 2020-07-06 (×2): qty 1

## 2020-07-06 MED ORDER — ACETAMINOPHEN 325 MG PO TABS
650.0000 mg | ORAL_TABLET | ORAL | Status: DC | PRN
  Administered 2020-07-06: 650 mg via ORAL
  Filled 2020-07-06: qty 2

## 2020-07-06 MED ORDER — ACETAMINOPHEN 325 MG PO TABS: 650.0000 mg | ORAL_TABLET | ORAL | Status: DC

## 2020-07-06 MED ORDER — ACETAMINOPHEN 325 MG PO TABS
650.0000 mg | ORAL_TABLET | ORAL | Status: DC | PRN
  Administered 2020-07-07: 650 mg via ORAL
  Filled 2020-07-06: qty 2

## 2020-07-06 MED ORDER — GABAPENTIN 100 MG PO CAPS
100.0000 mg | ORAL_CAPSULE | Freq: Three times a day (TID) | ORAL | Status: DC
  Administered 2020-07-06 – 2020-07-07 (×5): 100 mg via ORAL
  Filled 2020-07-06 (×5): qty 1

## 2020-07-06 MED ORDER — PANTOPRAZOLE SODIUM 20 MG PO TBEC
20.0000 mg | DELAYED_RELEASE_TABLET | Freq: Every day | ORAL | Status: DC
  Administered 2020-07-06 – 2020-07-11 (×6): 20 mg via ORAL
  Filled 2020-07-06 (×6): qty 1

## 2020-07-06 MED ORDER — GABAPENTIN 100 MG PO CAPS
100.0000 mg | ORAL_CAPSULE | Freq: Two times a day (BID) | ORAL | Status: DC
  Administered 2020-07-06: 100 mg via ORAL
  Filled 2020-07-06: qty 1

## 2020-07-06 MED ORDER — HYDROCODONE-ACETAMINOPHEN 7.5-325 MG PO TABS
1.0000 | ORAL_TABLET | ORAL | Status: DC | PRN
  Administered 2020-07-06 – 2020-07-07 (×3): 2 via ORAL
  Filled 2020-07-06 (×3): qty 2

## 2020-07-06 MED ORDER — KETOROLAC TROMETHAMINE 15 MG/ML IJ SOLN
7.5000 mg | Freq: Once | INTRAMUSCULAR | Status: AC
  Administered 2020-07-06: 7.5 mg via INTRAVENOUS
  Filled 2020-07-06: qty 1

## 2020-07-06 MED ORDER — HYDROCODONE-ACETAMINOPHEN 5-325 MG PO TABS
1.0000 | ORAL_TABLET | ORAL | Status: DC | PRN
  Administered 2020-07-06 (×2): 2 via ORAL
  Filled 2020-07-06 (×2): qty 2

## 2020-07-06 MED ORDER — KETOROLAC TROMETHAMINE 15 MG/ML IJ SOLN
15.0000 mg | Freq: Once | INTRAMUSCULAR | Status: AC
  Administered 2020-07-06: 15 mg via INTRAVENOUS
  Filled 2020-07-06: qty 1

## 2020-07-06 NOTE — Progress Notes (Signed)
Pt now rating her pain an "11" laying still. Administered 2 Norco per order. Pt's daughter at bedside. Repositioned patient and provided education and emotional support to both daughter and patient.

## 2020-07-06 NOTE — TOC Progression Note (Signed)
Transition of Care [facility]) - Progression Note    Patient Details  Name: Michele Summers MRN: [id number] Date of Birth: 11-19-1931  Transition of Care [facility]) CM/SW [place], LCSW Phone Number: 07/06/2020, 9:33 AM  Clinical Narrative:   Spoke to Dr. Kurtis Bushman, patient not medically ready today. Updated Magda Paganini at [facility]. Patient will go to [facility] when medically ready.    Expected Discharge Plan: [place] Barriers to Discharge: Continued Medical Work up  Expected Discharge Plan and Services Expected Discharge Plan: [place] arrangements for the past 2 months: Single Family Home                                       Social Determinants of Health (SDOH) Interventions    Readmission Risk Interventions No flowsheet data found.

## 2020-07-06 NOTE — Progress Notes (Signed)
Pt's daughter at bedside and upset that her mom has spent all day in pain with no relief below a "7 out of 10". Updated her on my conversations with Dr. Kurtis Bushman and her concern for increased confusion. Daughter disagrees with decision and is requesting to speak to management. Directed her to [place], Dietitian.

## 2020-07-06 NOTE — Progress Notes (Signed)
Pt's pain an "8" laying still. Has been taking Norco OTC but ineffective. Requested Dr. Kurtis Bushman to order something stronger. Instructed to give Norco again as scheduled and see what happens. Pt's daughter at bedside. Gave them the update.

## 2020-07-06 NOTE — Progress Notes (Signed)
PROGRESS NOTE    Michele Summers  EHM:[id number] DOB: [birth year] DOA: 07/01/2020 PCP: [name], MD    Brief Narrative:  Michele Summers is a 87 y.o. female with medical history significant for right hip fracture in 2013 status post right total hip arthroplasty, paroxysmal atrial fibrillation complicated by sick sinus syndrome status post pacemaker placement, hypertension, chronic diastolic heart failure, generalized anxiety disorder, moderate persistent asthma, who is admitted to [facility] on 07/01/2020 with acute right hip pain after presenting to [facility] ED complaining of right hip pain.  She confirms a history of right hip fracture back in 2013, at which time she underwent total right hip arthroplasty without any reported ensuing complications from this procedure  Addendum: 2/17-Per Dr. [name] via messaging, need to have IR aspirate rt hip for gram stain and culture r/o infection as xray suggestive.  2/18-plan for aspiration today by IR.   2/19-spoke to Dr. [name] radiology he reported we should obtain US of anterior hip to r/o liquid or mass, if this was negative then consider MR for further evalatuion 2/20-rt hip anterior [place] completed. Send message to oncall ortho for review, if we need MR for further evaluation.  2/21-pain this am.spoke to ID and ortho, and radiology, plan to do another aspirate +/- hip bx in am as she got lovenox today  Consultants:   Orthopedics, ID,IR  Procedures:   Antimicrobials:       Subjective: With hip pain this am. Discussed starting gabapentin and daughter was agreeable.  Objective: Vitals:   07/06/20 0651 07/06/20 0743 07/06/20 0800 07/06/20 1146  BP:  (!) 158/58 (!) 156/57 (!) 142/44  Pulse:  65 63 62  Resp:  '17 17 18  ' Temp:  97.9 F (36.6 C) 97.8 F (36.6 C) 98 F (36.7 C)  TempSrc:   Oral   SpO2:  94% 97% 96%  Weight: 68 kg     Height:        Intake/Output Summary (Last 24 hours) at 07/06/2020 1211 Last data filed at 07/06/2020 3662 Gross  per 24 hour  Intake 120 ml  Output 1000 ml  Net -880 ml   Filed Weights   07/04/20 0539 07/05/20 0500 07/06/20 0651  Weight: 68 kg 68.2 kg 68 kg    Examination:  Calm, nad cta no w/r/r Regular s1/s2 Soft benign +bs No edema Aaxox3, grossly intact    Data Reviewed: I have personally reviewed following labs and imaging studies  CBC: Recent Labs  Lab 07/01/20 1857 07/02/20 0417 07/04/20 1413  WBC 5.2 6.5 8.8  HGB 11.0* 10.7* 10.6*  HCT 34.8* 33.4* 32.9*  MCV 82.9 82.5 81.6  PLT 350 328 947   Basic Metabolic Panel: Recent Labs  Lab 07/01/20 1857 07/02/20 0417 07/04/20 1413  NA 136 136  --   K 4.3 4.0  --   CL 104 104  --   CO2 22 24  --   GLUCOSE 244* 204*  --   BUN 16 18  --   CREATININE 0.80 0.92 0.74  CALCIUM 8.8* 8.9  --   MG 1.9 1.9  --    GFR: Estimated Creatinine Clearance: 47.8 mL/min (by C-G formula based on SCr of 0.74 mg/dL). Liver Function Tests: No results for input(s): AST, ALT, ALKPHOS, BILITOT, PROT, ALBUMIN in the last 168 hours. No results for input(s): LIPASE, AMYLASE in the last 168 hours. No results for input(s): AMMONIA in the last 168 hours. Coagulation Profile: Recent Labs  Lab 07/04/20 1413  INR  1.1   Cardiac Enzymes: No results for input(s): CKTOTAL, CKMB, CKMBINDEX, TROPONINI in the last 168 hours. BNP (last 3 results) No results for input(s): PROBNP in the last 8760 hours. HbA1C: No results for input(s): HGBA1C in the last 72 hours. CBG: No results for input(s): GLUCAP in the last 168 hours. Lipid Profile: No results for input(s): CHOL, HDL, LDLCALC, TRIG, CHOLHDL, LDLDIRECT in the last 72 hours. Thyroid Function Tests: No results for input(s): TSH, T4TOTAL, FREET4, T3FREE, THYROIDAB in the last 72 hours. Anemia Panel: No results for input(s): VITAMINB12, FOLATE, FERRITIN, TIBC, IRON, RETICCTPCT in the last 72 hours. Sepsis Labs: No results for input(s): PROCALCITON, LATICACIDVEN in the last 168 hours.  Recent  Results (from the past 240 hour(s))  Resp Panel by RT-PCR (Flu A&B, Covid) Nasopharyngeal Swab     Status: None   Collection Time: 07/01/20  4:45 PM   Specimen: Nasopharyngeal Swab; Nasopharyngeal(NP) swabs in vial transport medium  Result Value Ref Range Status   SARS Coronavirus 2 by RT PCR NEGATIVE NEGATIVE Final    Comment: (NOTE) SARS-CoV-2 target nucleic acids are NOT DETECTED.  The SARS-CoV-2 RNA is generally detectable in upper respiratory specimens during the acute phase of infection. The lowest concentration of SARS-CoV-2 viral copies this assay can detect is 138 copies/mL. A negative result does not preclude SARS-Cov-2 infection and should not be used as the sole basis for treatment or other patient management decisions. A negative result may occur with  improper specimen collection/handling, submission of specimen other than nasopharyngeal swab, presence of viral mutation(s) within the areas targeted by this assay, and inadequate number of viral copies(<138 copies/mL). A negative result must be combined with clinical observations, patient history, and epidemiological information. The expected result is Negative.  Fact Sheet for Patients:  EntrepreneurPulse.com.au  Fact Sheet for Healthcare Providers:  IncredibleEmployment.be  This test is no t yet approved or cleared by the [place] FDA and  has been authorized for detection and/or diagnosis of SARS-CoV-2 by FDA under an Emergency Use Authorization (EUA). This EUA will remain  in effect (meaning this test can be used) for the duration of the COVID-19 declaration under Section 564(b)(1) of the Act, 21 U.S.C.section 360bbb-3(b)(1), unless the authorization is terminated  or revoked sooner.       Influenza A by PCR NEGATIVE NEGATIVE Final   Influenza B by PCR NEGATIVE NEGATIVE Final    Comment: (NOTE) The Xpert Xpress SARS-CoV-2/FLU/RSV plus assay is intended as an aid in the  diagnosis of influenza from Nasopharyngeal swab specimens and should not be used as a sole basis for treatment. Nasal washings and aspirates are unacceptable for Xpert Xpress SARS-CoV-2/FLU/RSV testing.  Fact Sheet for Patients: EntrepreneurPulse.com.au  Fact Sheet for Healthcare Providers: IncredibleEmployment.be  This test is not yet approved or cleared by the [place] FDA and has been authorized for detection and/or diagnosis of SARS-CoV-2 by FDA under an Emergency Use Authorization (EUA). This EUA will remain in effect (meaning this test can be used) for the duration of the COVID-19 declaration under Section 564(b)(1) of the Act, 21 U.S.C. section 360bbb-3(b)(1), unless the authorization is terminated or revoked.  Performed at [facility], [address] [place], [place] [address]   Culture, blood (Routine X 2) w Reflex to ID Panel     Status: None (Preliminary result)   Collection Time: 07/02/20  4:24 PM   Specimen: BLOOD  Result Value Ref Range Status   Specimen Description BLOOD RIGHT ANTECUBITAL  Final  Special Requests   Final    BOTTLES DRAWN AEROBIC AND ANAEROBIC Blood Culture adequate volume   Culture   Final    NO GROWTH 4 DAYS Performed at [facility], [address] [place], [place] [address]    Report Status PENDING  Incomplete  Culture, blood (Routine X 2) w Reflex to ID Panel     Status: None (Preliminary result)   Collection Time: 07/02/20  4:24 PM   Specimen: BLOOD  Result Value Ref Range Status   Specimen Description BLOOD LEFT ANTECUBITAL  Final   Special Requests   Final    BOTTLES DRAWN AEROBIC AND ANAEROBIC Blood Culture adequate volume   Culture   Final    NO GROWTH 4 DAYS Performed at [facility], [address], [place], [place] [address]    Report Status PENDING  Incomplete  Body fluid culture w Gram Stain     Status: None (Preliminary result)   Collection Time:  07/03/20 10:16 AM   Specimen: PATH Cytology Misc. fluid; Synovial Fluid  Result Value Ref Range Status   Specimen Description   Final    SYNOVIAL Performed at [facility], [address] [place], [place] [address]    Special Requests   Final    SYNOVIAL Performed at [facility], [place], [place] [address]    Gram Stain NO WBC SEEN NO ORGANISMS SEEN   Final   Culture   Final    NO GROWTH 3 DAYS Performed at [facility], [place] [address] [place], [place] [address]    Report Status PENDING  Incomplete         Radiology Studies: No results found.      Scheduled Meds: . amLODipine  2.5 mg Oral Daily  . brimonidine  1 drop Both Eyes Q12H   And  . timolol  1 drop Both Eyes Q12H  . feeding supplement  237 mL Oral BID BM  . FLUoxetine  40 mg Oral Daily  . fluticasone furoate-vilanterol  1 puff Inhalation Daily  . gabapentin  100 mg Oral BID  . lidocaine  1 patch Transdermal Q24H  . montelukast  10 mg Oral Daily  . moxifloxacin  1 drop Both Eyes TID  . multivitamin with minerals  1 tablet Oral Daily  . nepafenac  1 drop Both Eyes Daily  . Netarsudil-Latanoprost  1 drop Both Eyes QPM  . pantoprazole  20 mg Oral Daily  . polyethylene glycol  17 g Oral Daily  . senna-docusate  2 tablet Oral BID  . sotalol  80 mg Oral Daily   Continuous Infusions:  Assessment & Plan:   Principal Problem:   Acute right hip pain Active Problems:   Moderate persistent asthma   Essential hypertension   GAD (generalized anxiety disorder)   Paroxysmal atrial fibrillation (HCC)   Chronic diastolic CHF (congestive heart failure) (HCC)   Malnutrition of moderate degree   #) Acute right hip pain:  causing a decline in ambulatory activity . Ortho consulted-input was appreciated-no evidence of fracture. There is stress shielding of the distal implant that may cause osteolysis of the lateral femur Need to rule out infection/osteomyelitis She is  a poor surgical candidate and would prefer nonoperative management ID consulted input was appreciated-x-ray with cortical destruction with questionable infection/osteomyelitis ESR minimally elevated, CRP is 3.  No leukocytosis and afebrile 2/19-s/p Hip aspiration and cx sent by IR on 2/18.  Per IR, need Rt hip [place]  to ck anterior hip for mass v.s. fluid, if negative, should get MR, if negative then bone bx.  PT rec. SNF 2/20-anterior Rt hip [place] completed. Awaiting f/u response with ortho Dr. Mack Guise about next step, if pt needs MR or not, he will review and let me know. -For now cultures pending, per ID if negative then consider empiric treatment with p.o. antibiotics with doxycycline with plus Keflex for a month 2/21-plan for aspirate via ultrsound with bone bx by IR , spoke to IR today Hold lovenox Npo at [facility] tonight   #) Chronic diastolic failure: Documented history of such, with most recent echocardiogram in January 2013 showing normal left ventricular cavity size, LVEF 60 to 65% 2/21-compensated, no acute exacerbation I/o Daily wt     #) Paroxysmal atrial fibrillation: stable. On Eliquis , held for aspiration Spoke to IR Dr. Earleen Newport ok'd to resume. I will place her on lovenox for now incase there is any planned procedures in near future. 2/21-hold lovenox for procedure in am by IR      #) Essential hypertension: stable Mildly elevated 2/21-increase amlodipine to 69m daily     #) Generalized anxiety disorder: Continue Prozac and Xanax as needed at home     #) Moderate persistent asthma: Outpatient respiratory regimen consists of scheduled Advair, Singulair.  No clinical evidence to suggest acute exacerbation.    #)Gerd-add ppi as pt c/o reflux sx  DVT prophylaxis: lovenox Code Status: Full Family Communication: daughter at bedside  Status is: Inpatient  Remains inpatient appropriate because:Inpatient level of care appropriate due to severity of  illness   Dispo: The patient is from: Home              Anticipated d/c is to: SNF              Anticipated d/c date is: 2 days              Patient currently is not medically stable to d/c.w/u pending.    Difficult to place patient No            LOS: 5 days   Time spent: 35 min with >50% on coc    SNolberto Hanlon MD [facility] Pager [phone]  If 7PM-7AM, please contact night-coverage 07/06/2020, 12:11 PM

## 2020-07-06 NOTE — Progress Notes (Signed)
Pt's pain still a "7 out of 10" 2 hours post 2 tabs Norco PO. Pt requesting more pain relief. Dr. Kurtis Bushman notified.

## 2020-07-06 NOTE — Progress Notes (Signed)
  Subjective:  Patient reports pain as marked.  Right thigh pain.  Objective:   VITALS:   Vitals:   07/06/20 0651 07/06/20 0743 07/06/20 0800 07/06/20 1146  BP:  (!) 158/58 (!) 156/57 (!) 142/44  Pulse:  65 63 62  Resp:  17 17 18   Temp:  97.9 F (36.6 C) 97.8 F (36.6 C) 98 F (36.7 C)  TempSrc:   Oral   SpO2:  94% 97% 96%  Weight: 68 kg     Height:        PHYSICAL EXAM:  Neurologically intact Dorsiflexion/Plantar flexion intact No cellulitis present Compartment soft  Tender over anterior thigh, mild swelling, no erythema  LABS  No results found for this or any previous visit (from the past 24 hour(s)).  No results found.  Assessment/Plan:     Principal Problem:   Acute right hip pain Active Problems:   Moderate persistent asthma   Essential hypertension   GAD (generalized anxiety disorder)   Paroxysmal atrial fibrillation (HCC)   Chronic diastolic CHF (congestive heart failure) (HCC)   Malnutrition of moderate degree   Up with therapy when able, touch down weight bearing.  Agree with plan for repeat aspiration and hopefully drainage of any fluid collection. Also send to pathology for evaluation of any primary or secondary metastatic lesion. MRI scanning would be useful, however this is not possible due to her pacer leads. There has been no evidence of abscess or infection to date. The presentation is concerning for possible soft tissue tumor. No surgery is indicated at this time. The prosthesis does not appear to be loose and there is no evidence of fracture, therefore I would not recommend any revision surgery of the right hip.  Lovell Sheehan , MD 07/06/2020, 2:55 PM

## 2020-07-06 NOTE — Progress Notes (Signed)
Pt still rating her pain an "7" out of 10 while not moving. Notified Dr. Kurtis Bushman and that patient is requesting more pain relief medications. Told Dr. Kurtis Bushman there's nothing more I can give, I've exhausted all options and it's not enough. Dr. Kurtis Bushman expressed concerned that pt's mentation may worsen with more pain medication. Currently patient's mentation is baseline for her admission (AOx3-4). Received order to give Tylenol PO. Administered.

## 2020-07-06 NOTE — Progress Notes (Signed)
Notified Dr. Kurtis Bushman that the patient and pt's daughter are upset with her pain management and that pt is still not coming below a "7" in pain. Pt continues to hold still in bed but grimaces regularly in pain. No orders received. Daughter is speaking to Furniture conservator/restorer re: dissatisfaction in pain management care for her mom.

## 2020-07-06 NOTE — Progress Notes (Signed)
   Date of Admission:  07/01/2020     ID: Michele Summers is a 87 y.o. female  Principal Problem:   Acute right hip pain Active Problems:   Moderate persistent asthma   Essential hypertension   GAD (generalized anxiety disorder)   Paroxysmal atrial fibrillation (HCC)   Chronic diastolic CHF (congestive heart failure) (HCC)   Malnutrition of moderate degree    Subjective: Patient waiting to have the fluid aspirated from the right thigh Has pain when she bears weight on that leg No fever  Medications:  . [START ON 07/07/2020] amLODipine  5 mg Oral Daily  . brimonidine  1 drop Both Eyes Q12H   And  . timolol  1 drop Both Eyes Q12H  . feeding supplement  237 mL Oral BID BM  . FLUoxetine  40 mg Oral Daily  . fluticasone furoate-vilanterol  1 puff Inhalation Daily  . gabapentin  100 mg Oral TID  . ketorolac  7.5 mg Intravenous Once  . lidocaine  1 patch Transdermal Q24H  . montelukast  10 mg Oral Daily  . moxifloxacin  1 drop Both Eyes TID  . multivitamin with minerals  1 tablet Oral Daily  . nepafenac  1 drop Both Eyes Daily  . Netarsudil-Latanoprost  1 drop Both Eyes QPM  . pantoprazole  20 mg Oral Daily  . polyethylene glycol  17 g Oral Daily  . senna-docusate  2 tablet Oral BID  . sotalol  80 mg Oral Daily    Objective: Vital signs in last 24 hours: Temp:  [97.8 F (36.6 C)-98.6 F (37 C)] 98 F (36.7 C) (02/21 1146) Pulse Rate:  [62-90] 62 (02/21 1146) Resp:  [15-18] 18 (02/21 1146) BP: (114-158)/(44-74) 142/44 (02/21 1146) SpO2:  [94 %-97 %] 96 % (02/21 1146) Weight:  [68 kg] 68 kg (02/21 0651)  PHYSICAL EXAM:  General: Alert, cooperative, no distress at rest, appears stated age.  Lungs: Clear to auscultation bilaterally. No Wheezing or Rhonchi. No rales. Heart: Regular rate and rhythm, no murmur, rub or gallop. Abdomen: Soft, non-tender,not distended. Bowel sounds normal. No masses Extremities: Right thigh anteriorly swelling seen no erythema No  tenderness No warmth Skin: No rashes or lesions. Or bruising Lymph: Cervical, supraclavicular normal. Neurologic: Grossly non-focal  Lab Results Recent Labs    07/04/20 1413  WBC 8.8  HGB 10.6*  HCT 32.9*  CREATININE 0.74   Microbiology: Blood culture no growth Right hip aspirate no growth Studies/Results: Ultrasound of the soft tissue of the thigh revealed a debris-containing fluid collection measuring 12.7 cm to 5.5 cm to 5.9 cm.  Assessment/Plan: Acute onset right hip and thigh pain at the site of right hemiarthroplasty Initial x-ray had shown lucency with cortical destruction in the proximal femur around the hip replacement stem concerning for infection/osteomyelitis A right hip aspiration was a dry tap A recent ultrasound showed a fluid collection of 13 cm. She is awaiting aspiration by IR.  This allowed to be sent for bacterial, fungal and AFB culture and pathology. Discussed the management with the care team.

## 2020-07-07 ENCOUNTER — Inpatient Hospital Stay: Payer: Medicare Other

## 2020-07-07 ENCOUNTER — Ambulatory Visit: Payer: Medicare Other | Admitting: Hospice and Palliative Medicine

## 2020-07-07 DIAGNOSIS — M25551 Pain in right hip: Secondary | ICD-10-CM | POA: Diagnosis not present

## 2020-07-07 LAB — CREATININE, SERUM
Creatinine, Ser: 0.98 mg/dL (ref 0.44–1.00)
GFR, Estimated: 56 mL/min — ABNORMAL LOW (ref >60)

## 2020-07-07 LAB — CBC
HCT: 34.4 % — ABNORMAL LOW (ref 36.0–46.0)
Hemoglobin: 10.7 g/dL — ABNORMAL LOW (ref 12.0–15.0)
MCH: 26.3 pg (ref 26.0–34.0)
MCHC: 31.1 g/dL (ref 30.0–36.0)
MCV: 84.5 fL (ref 80.0–100.0)
Platelets: 421 10*3/uL — ABNORMAL HIGH (ref 150–400)
RBC: 4.07 MIL/uL (ref 3.87–5.11)
RDW: 13.9 % (ref 11.5–15.5)
WBC: 11.3 10*3/uL — ABNORMAL HIGH (ref 4.0–10.5)
nRBC: 0 % (ref 0.0–0.2)

## 2020-07-07 LAB — CULTURE, BLOOD (ROUTINE X 2)
Culture: NO GROWTH
Culture: NO GROWTH
Special Requests: ADEQUATE
Special Requests: ADEQUATE

## 2020-07-07 LAB — BODY FLUID CULTURE W GRAM STAIN
Culture: NO GROWTH
Gram Stain: NONE SEEN

## 2020-07-07 MED ORDER — FENTANYL CITRATE (PF) 100 MCG/2ML IJ SOLN
INTRAMUSCULAR | Status: AC
  Filled 2020-07-07: qty 2

## 2020-07-07 MED ORDER — FENTANYL CITRATE (PF) 100 MCG/2ML IJ SOLN
INTRAMUSCULAR | Status: DC | PRN
  Administered 2020-07-07: 25 ug via INTRAVENOUS

## 2020-07-07 MED ORDER — MIDAZOLAM HCL 2 MG/2ML IJ SOLN
INTRAMUSCULAR | Status: AC
  Filled 2020-07-07: qty 2

## 2020-07-07 MED ORDER — ENOXAPARIN SODIUM 80 MG/0.8ML ~~LOC~~ SOLN
1.0000 mg/kg | Freq: Two times a day (BID) | SUBCUTANEOUS | Status: DC
  Administered 2020-07-07 – 2020-07-10 (×6): 70 mg via SUBCUTANEOUS
  Filled 2020-07-07 (×7): qty 0.8

## 2020-07-07 NOTE — Progress Notes (Signed)
MEDICATION-RELATED CONSULT NOTE   IR Procedure Consult - Anticoagulant/Antiplatelet PTA/Inpatient Med List Review by Pharmacist    Procedure: Biopsy- right thigh mass     Completed: 2/22  @ 11:52   Post-Procedural bleeding risk per IR MD assessment: Standard     Antithrombotic medications on inpatient or PTA profile prior to procedure:   Enoxaparin treatment dose prior to procedure. Apixaban PTA.     Recommended restart time per IR Post-Procedure Guidelines:   6 hours after for 1 mg/kg q12h dose regimen      Plan:     Will restart treatment dose Enoxaparin 1mg /kg every 12 hours this evening.  F/U procedure plan and restart apixaban when able.   Pernell Dupre, PharmD, BCPS Clinical Pharmacist 07/07/2020 11:56 AM

## 2020-07-07 NOTE — Progress Notes (Signed)
Patient ID: STELLA BORTLE, female   DOB: [birth year], 87 y.o.   MRN: [id number] Spoke to daughter yesterday during rounds about initiating gabapentin slowly and increasing narco dose, monitoring for confusion. Also explained if pt going to SNF, we want to minimize iv meds as she cannot receive them at snf. Daughter was agreeable. Received chat from nurse Joy yesterday about pt's pain. I spoke to her via phone and explained I had given toradol iv x1 , she received 2 tab. Of narco and added gabapentin and will add tylenol. At that time nsg told me she has not received her 2nd dose of gabapentin, I told her please give and lets see how she does as she received toradol 47min ago and will add tylenol too. I did express my concern that pt may become confused and /or causing respiratory depression as she is 87yrs old as I am uncomfortable adding more and we have to give this regimen a chance to work.. Also pt with morphine allergy thus unable to use this .

## 2020-07-07 NOTE — Procedures (Signed)
Interventional Radiology Procedure Note  Procedure: Biopsy  Indication: Right thigh mass  Findings: Please refer to procedural dictation for full description.  Complications: None  EBL: < 10 mL  Miachel Roux, MD [phone]

## 2020-07-07 NOTE — Progress Notes (Signed)
Date of Admission:  07/01/2020      ID: Michele Summers is a 87 y.o. female Principal Problem:   Acute right hip pain Active Problems:   Moderate persistent asthma   Essential hypertension   GAD (generalized anxiety disorder)   Paroxysmal atrial fibrillation (HCC)   Chronic diastolic CHF (congestive heart failure) (HCC)   Malnutrition of moderate degree     Pt underwent biopsy of the mass on the thigh today She had sever epain and US doppler shows non occlusive thrombus of  CFV on the rt side  Subjective: Sleeping currently Medications:  . amLODipine  5 mg Oral Daily  . brimonidine  1 drop Both Eyes Q12H   And  . timolol  1 drop Both Eyes Q12H  . enoxaparin (LOVENOX) injection  1 mg/kg Subcutaneous Q12H  . feeding supplement  237 mL Oral BID BM  . FLUoxetine  40 mg Oral Daily  . fluticasone furoate-vilanterol  1 puff Inhalation Daily  . gabapentin  100 mg Oral TID  . lidocaine  1 patch Transdermal Q24H  . montelukast  10 mg Oral Daily  . moxifloxacin  1 drop Both Eyes TID  . multivitamin with minerals  1 tablet Oral Daily  . nepafenac  1 drop Both Eyes Daily  . Netarsudil-Latanoprost  1 drop Both Eyes QPM  . pantoprazole  20 mg Oral Daily  . polyethylene glycol  17 g Oral Daily  . senna-docusate  2 tablet Oral BID  . sotalol  80 mg Oral Daily    Objective: Vital signs in last 24 hours: Temp:  [97.6 F (36.4 C)-98.9 F (37.2 C)] 98.9 F (37.2 C) (02/22 1949) Pulse Rate:  [63-182] 63 (02/22 1949) Resp:  [13-19] 17 (02/22 1949) BP: (87-121)/(35-67) 111/46 (02/22 1949) SpO2:  [93 %-100 %] 96 % (02/22 1949) Weight:  [68.8 kg] 68.8 kg (02/22 0642)  PHYSICAL EXAM:  General: sleeping Lab Results Recent Labs    07/07/20 0556  WBC 11.3*  HGB 10.7*  HCT 34.4*  CREATININE 0.98   Liver Panel No results for input(s): PROT, ALBUMIN, AST, ALT, ALKPHOS, BILITOT, BILIDIR, IBILI in the last 72 hours. Sedimentation Rate No results for input(s): ESRSEDRATE in the  last 72 hours. C-Reactive Protein No results for input(s): CRP in the last 72 hours.  Microbiology:  Studies/Results: US Venous Img Lower Unilateral Right (DVT)  Result Date: 07/07/2020 CLINICAL DATA:  87 year old female with right lower extremity swelling. EXAM: RIGHT LOWER EXTREMITY VENOUS DOPPLER ULTRASOUND TECHNIQUE: Gray-scale sonography with graded compression, as well as color Doppler and duplex ultrasound were performed to evaluate the right lower extremity deep venous systems from the level of the common femoral vein and including the common femoral, femoral, profunda femoral, popliteal and calf veins including the posterior tibial, peroneal and gastrocnemius veins when visible. Spectral Doppler was utilized to evaluate flow at rest and with distal augmentation maneuvers in the common femoral, femoral and popliteal veins. The contralateral common femoral vein was also evaluated for comparison. COMPARISON:  None. FINDINGS: RIGHT LOWER EXTREMITY Common Femoral Vein: No evidence of thrombus. Normal compressibility, respiratory phasicity and response to augmentation. Central Greater Saphenous Vein: No evidence of thrombus. Normal compressibility and flow on color Doppler imaging. Central Profunda Femoral Vein: No evidence of thrombus. Normal compressibility and flow on color Doppler imaging. Femoral Vein: Short-segment, nonocclusive focal thrombus in the central femoral vein. The femoral vein is patent in the mid and peripheral segments. Popliteal Vein: No evidence of thrombus. Normal compressibility, respiratory  phasicity and response to augmentation. Calf Veins: No evidence of thrombus. Normal compressibility and flow on color Doppler imaging. Other Findings:  None. LEFT LOWER EXTREMITY Common Femoral Vein: No evidence of thrombus. Normal compressibility, respiratory phasicity and response to augmentation. IMPRESSION: Subacute appearing, non-occlusive focal thrombus limited to the central right  femoral vein. Ruthann Cancer, MD [facility] Radiology Electronically Signed   By: Ruthann Cancer MD   On: 07/07/2020 11:40   [place] CORE BIOPSY (SOFT TISSUE)  Result Date: 07/07/2020 INDICATION: 87 year old woman with right thigh mass/fluid collection presents to interventional radiology for ultrasound-guided aspiration/biopsy EXAM: Ultrasound-guided aspiration and biopsy of right upper thigh mass/fluid collection MEDICATIONS: None. ANESTHESIA/SEDATION: Moderate (conscious) sedation was employed during this procedure. A total of Versed 0 mg and Fentanyl 25 mcg was administered intravenously. Moderate Sedation Time: 10 minutes. The patient's level of consciousness and vital signs were monitored continuously by radiology nursing throughout the procedure under my direct supervision. COMPLICATIONS: None immediate. PROCEDURE: Informed written consent was obtained from the patient after a thorough discussion of the procedural risks, benefits and alternatives. All questions were addressed. Maximal Sterile Barrier Technique was utilized including caps, mask, sterile gowns, sterile gloves, sterile drape, hand hygiene and skin antiseptic. A timeout was performed prior to the initiation of the procedure. Patient position supine on the ultrasound table. Right upper thigh skin prepped and draped in usual sterile fashion. Ultrasound evaluation of the right upper thigh again demonstrated the hypoechoic collection/mass. Following local lidocaine administration, 8 [place] Yueh needle was advanced into the lesion, however no fluid could be aspirated. 17 gauge introducer needle was then advanced into the right upper thigh lesion, and four 18 gauge cores were obtained utilizing continuous ultrasound guidance. Samples were sent to pathology in formalin. Needle removed and hemostasis achieved with 2 minutes of manual compression. Post procedure ultrasound images showed no evidence of  significant hemorrhage. IMPRESSION: Ultrasound-guided biopsy of right thigh mass Electronically Signed   By: Miachel Roux M.D.   On: 07/07/2020 16:03   [place] FINE NEEDLE ASP 1ST LESION  Result Date: 07/07/2020 INDICATION: 87 year old woman with right thigh mass/fluid collection presents to interventional radiology for ultrasound-guided aspiration/biopsy EXAM: Ultrasound-guided aspiration and biopsy of right upper thigh mass/fluid collection MEDICATIONS: None. ANESTHESIA/SEDATION: Moderate (conscious) sedation was employed during this procedure. A total of Versed 0 mg and Fentanyl 25 mcg was administered intravenously. Moderate Sedation Time: 10 minutes. The patient's level of consciousness and vital signs were monitored continuously by radiology nursing throughout the procedure under my direct supervision. COMPLICATIONS: None immediate. PROCEDURE: Informed written consent was obtained from the patient after a thorough discussion of the procedural risks, benefits and alternatives. All questions were addressed. Maximal Sterile Barrier Technique was utilized including caps, mask, sterile gowns, sterile gloves, sterile drape, hand hygiene and skin antiseptic. A timeout was performed prior to the initiation of the procedure. Patient position supine on the ultrasound table. Right upper thigh skin prepped and draped in usual sterile fashion. Ultrasound evaluation of the right upper thigh again demonstrated the hypoechoic collection/mass. Following local lidocaine administration, 8 [place] Yueh needle was advanced into the lesion, however no fluid could be aspirated. 17 gauge introducer needle was then advanced into the right upper thigh lesion, and four 18 gauge cores were obtained utilizing continuous ultrasound guidance. Samples were sent to pathology in formalin. Needle removed and hemostasis achieved with 2 minutes of manual compression. Post procedure ultrasound images showed no evidence of significant hemorrhage.  IMPRESSION: Ultrasound-guided biopsy of right  thigh mass Electronically Signed   By: Miachel Roux M.D.   On: 07/07/2020 16:03     Assessment/Plan: Acute onset right hip and thigh pain at the site of right hemiarthroplasty. Initial x-ray had some lucency with cortical destruction of the proximal fever around the hip replacement stem concerning for infection osteomyelitis right hip aspiration was a dry tap. Recent ultrasound showed a fluid collection of 13 cm Today she underwent aspiration but no fluid was obtained so she  underwent a core biopsy of the mass. All the tissues have been sent for pathology and no cultures were sent Called lab and they are not aware of that the specimen is. We will try to reach tomorrow to see whether we can still have cultures done.

## 2020-07-07 NOTE — Progress Notes (Signed)
Physical Therapy Treatment Patient Details Name: Michele Summers MRN: [id number] DOB: December 22, 1931 Today's Date: 07/07/2020    History of Present Illness Pt is an 87 yo female arrived at the ED with R hip pain and difficulty walking.  Per pt and daughter present at bedside the pt had imaging done at [facility] over a weak ago with no signs of fracture.  Per patient's daughter they were told the pain was likely from bursitis.  PMH includes R THA 2013, CHF, PE, and CVA.    PT Comments    Pt is making gradual progress towards goals, however continues to be limited by pain. Fatigues quickly, however is motivated to participate to best of her abilities. Will continue to progress as able. Focused on global there-ex this date.   Follow Up Recommendations  SNF;Supervision for mobility/OOB     Equipment Recommendations  Rolling walker with 5" wheels;3in1 (PT)    Recommendations for Other Services       Precautions / Restrictions Precautions Precautions: Fall Restrictions Weight Bearing Restrictions: Yes RLE Weight Bearing: Touchdown weight bearing    Mobility  Bed Mobility Overal bed mobility: Needs Assistance Bed Mobility: Supine to Sit     Supine to sit: Mod assist Sit to supine: Max assist   General bed mobility comments: needs assist for progressing R LE. Very slow movement. Once seated at EOB, able to demonstrate upright posture. Fatigues quickly, not able to progress to standing at this time. Asked to return to bed. Mod assist for sliding to HOB.    Transfers                 General transfer comment: not able to tolerate at this time  Ambulation/Gait                 Stairs             Wheelchair Mobility    Modified Rankin (Stroke Patients Only)       Balance Overall balance assessment: Needs assistance   Sitting balance-Leahy Scale: Good                                      Cognition Arousal/Alertness:  Awake/alert Behavior During Therapy: WFL for tasks assessed/performed Overall Cognitive Status: Within Functional Limits for tasks assessed                                        Exercises Other Exercises Other Exercises: Supine/seated ther-ex performed on B LE/UE including shoulder flexion, bicep curls, R LE quad sets, SLRs, LAQ and AP. L LE AP, SLRs, hip abd/add, SAQ, LAQ. All ther-ex performed x 10 reps with mod assist on R LE and supervision for L.    General Comments        Pertinent Vitals/Pain Pain Assessment: 0-10 Pain Score: 7  Pain Location: R hip Pain Descriptors / Indicators: Sore;Aching Pain Intervention(s): Limited activity within patient's tolerance;Premedicated before session    Home Living                      Prior Function            PT Goals (current goals can now be found in the care plan section) Acute Rehab PT Goals Patient Stated Goal: To walk without pain PT Goal  Formulation: With patient Time For Goal Achievement: 07/14/20 Potential to Achieve Goals: Good Progress towards PT goals: Progressing toward goals    Frequency    Min 2X/week      PT Plan Current plan remains appropriate    Co-evaluation              AM-PAC PT "6 Clicks" Mobility   Outcome Measure  Help needed turning from your back to your side while in a flat bed without using bedrails?: A Lot Help needed moving from lying on your back to sitting on the side of a flat bed without using bedrails?: A Lot Help needed moving to and from a bed to a chair (including a wheelchair)?: Total Help needed standing up from a chair using your arms (e.g., wheelchair or bedside chair)?: Total Help needed to walk in hospital room?: Total Help needed climbing 3-5 steps with a railing? : Total 6 Click Score: 8    End of Session   Activity Tolerance: Patient limited by pain Patient left: in bed;with call bell/phone within reach;with bed alarm set Nurse  Communication: Mobility status;Weight bearing status PT Visit Diagnosis: Difficulty in walking, not elsewhere classified (R26.2);Muscle weakness (generalized) (M62.81);Pain Pain - Right/Left: Right Pain - part of body: Hip     Time: 7116-5790 PT Time Calculation (min) (ACUTE ONLY): 38 min  Charges:  $Therapeutic Exercise: 23-37 mins $Therapeutic Activity: 8-22 mins                     Greggory Stallion, PT, DPT [phone]    Michele Summers 07/07/2020, 12:12 PM

## 2020-07-07 NOTE — Progress Notes (Signed)
PROGRESS NOTE    Michele Summers  NID:[id number] DOB: 03-08-1932 DOA: 07/01/2020 PCP: [name], MD    Brief Narrative:  Michele Summers is a 87 y.o. female with medical history significant for right hip fracture in 2013 status post right total hip arthroplasty, paroxysmal atrial fibrillation complicated by sick sinus syndrome status post pacemaker placement, hypertension, chronic diastolic heart failure, generalized anxiety disorder, moderate persistent asthma, who is admitted to [facility] on 07/01/2020 with acute right hip pain after presenting to [facility] ED complaining of right hip pain.  She confirms a history of right hip fracture back in 2013, at which time she underwent total right hip arthroplasty without any reported ensuing complications from this procedure  Addendum: 2/17-Per Dr. [name] via messaging, need to have IR aspirate rt hip for gram stain and culture r/o infection as xray suggestive.  2/18-plan for aspiration today by IR.  2/19-spoke to Dr. [name] radiology he reported we should obtain US of anterior hip to r/o liquid or mass, if this was negative then consider MR for further evalatuion 2/20-rt hip anterior [place] completed. Send message to oncall ortho for review, if we need MR for further evaluation.  2/21-pain this am.spoke to ID and ortho, and radiology, plan to do another aspirate +/- hip bx in am as she got lovenox today 2/22-complaining of sudden onset of rt thigh edema . S/p bx of rt thigh /hip   Consultants:   Orthopedics, ID,IR  Procedures:   Antimicrobials:       Subjective: Hip pain better today. However rt thigh swelling this am , per daughter was acute. Area TTp  Objective: Vitals:   07/07/20 1141 07/07/20 1145 07/07/20 1150 07/07/20 1155  BP: (!) 87/35 (!) 100/44 (!) 102/40 104/60  Pulse: 67 (!) 182 68 68  Resp: _0 Temp:      TempSrc:      SpO2: 95% 99% 99% 99%  Weight:      Height:        Intake/Output Summary (Last 24 hours) at  07/07/2020 1330 Last data filed at 07/06/2020 2121 Gross per 24 hour  Intake 60 ml  Output -  Net 60 ml   Filed Weights   07/05/20 0500 07/06/20 0651 07/07/20 0642  Weight: 68.2 kg 68 kg 68.8 kg    Examination: Calm, nad cta no w/r/r Regular s1/s2 no gallop Soft benign, +bs Rt thigh edema , ttp at mid thigh Aaxox3, grossly intact Mood and affect appropriate in current setting   Data Reviewed: I have personally reviewed following labs and imaging studies  CBC: Recent Labs  Lab 07/01/20 1857 07/02/20 0417 07/04/20 1413 07/07/20 0556  WBC 5.2 6.5 8.8 11.3*  HGB 11.0* 10.7* 10.6* 10.7*  HCT 34.8* 33.4* 32.9* 34.4*  MCV 82.9 82.5 81.6 84.5  PLT 350 328 369 144*   Basic Metabolic Panel: Recent Labs  Lab 07/01/20 1857 07/02/20 0417 07/04/20 1413 07/07/20 0556  NA 136 136  --   --   K 4.3 4.0  --   --   CL 104 104  --   --   CO2 22 24  --   --   GLUCOSE 244* 204*  --   --   BUN 16 18  --   --   CREATININE 0.80 0.92 0.74 0.98  CALCIUM 8.8* 8.9  --   --   MG 1.9 1.9  --   --    GFR: Estimated Creatinine Clearance: 39.2 mL/min (by  C-G formula based on SCr of 0.98 mg/dL). Liver Function Tests: No results for input(s): AST, ALT, ALKPHOS, BILITOT, PROT, ALBUMIN in the last 168 hours. No results for input(s): LIPASE, AMYLASE in the last 168 hours. No results for input(s): AMMONIA in the last 168 hours. Coagulation Profile: Recent Labs  Lab 07/04/20 1413  INR 1.1   Cardiac Enzymes: No results for input(s): CKTOTAL, CKMB, CKMBINDEX, TROPONINI in the last 168 hours. BNP (last 3 results) No results for input(s): PROBNP in the last 8760 hours. HbA1C: No results for input(s): HGBA1C in the last 72 hours. CBG: No results for input(s): GLUCAP in the last 168 hours. Lipid Profile: No results for input(s): CHOL, HDL, LDLCALC, TRIG, CHOLHDL, LDLDIRECT in the last 72 hours. Thyroid Function Tests: No results for input(s): TSH, T4TOTAL, FREET4, T3FREE, THYROIDAB in the  last 72 hours. Anemia Panel: No results for input(s): VITAMINB12, FOLATE, FERRITIN, TIBC, IRON, RETICCTPCT in the last 72 hours. Sepsis Labs: No results for input(s): PROCALCITON, LATICACIDVEN in the last 168 hours.  Recent Results (from the past 240 hour(s))  Resp Panel by RT-PCR (Flu A&B, Covid) Nasopharyngeal Swab     Status: None   Collection Time: 07/01/20  4:45 PM   Specimen: Nasopharyngeal Swab; Nasopharyngeal(NP) swabs in vial transport medium  Result Value Ref Range Status   SARS Coronavirus 2 by RT PCR NEGATIVE NEGATIVE Final    Comment: (NOTE) SARS-CoV-2 target nucleic acids are NOT DETECTED.  The SARS-CoV-2 RNA is generally detectable in upper respiratory specimens during the acute phase of infection. The lowest concentration of SARS-CoV-2 viral copies this assay can detect is 138 copies/mL. A negative result does not preclude SARS-Cov-2 infection and should not be used as the sole basis for treatment or other patient management decisions. A negative result may occur with  improper specimen collection/handling, submission of specimen other than nasopharyngeal swab, presence of viral mutation(s) within the areas targeted by this assay, and inadequate number of viral copies(<138 copies/mL). A negative result must be combined with clinical observations, patient history, and epidemiological information. The expected result is Negative.  Fact Sheet for Patients:  EntrepreneurPulse.com.au  Fact Sheet for Healthcare Providers:  IncredibleEmployment.be  This test is no t yet approved or cleared by the [place] FDA and  has been authorized for detection and/or diagnosis of SARS-CoV-2 by FDA under an Emergency Use Authorization (EUA). This EUA will remain  in effect (meaning this test can be used) for the duration of the COVID-19 declaration under Section 564(b)(1) of the Act, 21 U.S.C.section 360bbb-3(b)(1), unless the authorization is  terminated  or revoked sooner.       Influenza A by PCR NEGATIVE NEGATIVE Final   Influenza B by PCR NEGATIVE NEGATIVE Final    Comment: (NOTE) The Xpert Xpress SARS-CoV-2/FLU/RSV plus assay is intended as an aid in the diagnosis of influenza from Nasopharyngeal swab specimens and should not be used as a sole basis for treatment. Nasal washings and aspirates are unacceptable for Xpert Xpress SARS-CoV-2/FLU/RSV testing.  Fact Sheet for Patients: EntrepreneurPulse.com.au  Fact Sheet for Healthcare Providers: IncredibleEmployment.be  This test is not yet approved or cleared by the [place] FDA and has been authorized for detection and/or diagnosis of SARS-CoV-2 by FDA under an Emergency Use Authorization (EUA). This EUA will remain in effect (meaning this test can be used) for the duration of the COVID-19 declaration under Section 564(b)(1) of the Act, 21 U.S.C. section 360bbb-3(b)(1), unless the authorization is terminated or revoked.  Performed at [facility]  Lab, [address] [place], [place] [address]   Culture, blood (Routine X 2) w Reflex to ID Panel     Status: None   Collection Time: 07/02/20  4:24 PM   Specimen: BLOOD  Result Value Ref Range Status   Specimen Description BLOOD RIGHT ANTECUBITAL  Final   Special Requests   Final    BOTTLES DRAWN AEROBIC AND ANAEROBIC Blood Culture adequate volume   Culture   Final    NO GROWTH 5 DAYS Performed at [facility], [address], [place], [place] [address]    Report Status 07/07/2020 FINAL  Final  Culture, blood (Routine X 2) w Reflex to ID Panel     Status: None   Collection Time: 07/02/20  4:24 PM   Specimen: BLOOD  Result Value Ref Range Status   Specimen Description BLOOD LEFT ANTECUBITAL  Final   Special Requests   Final    BOTTLES DRAWN AEROBIC AND ANAEROBIC Blood Culture adequate volume   Culture   Final    NO GROWTH 5 DAYS Performed at [facility], [address] [place], [place] [address]    Report Status 07/07/2020 FINAL  Final  Fungus Culture With Stain     Status: None (Preliminary result)   Collection Time: 07/03/20 10:16 AM   Specimen: PATH Cytology Misc. fluid; Synovial Fluid  Result Value Ref Range Status   Fungus Stain Final report  Final    Comment: (NOTE) Performed At: [facility] [place], [place] [id number] Rush Farmer MD MI:[id number]    Fungus (Mycology) Culture PENDING  Incomplete   Fungal Source SYNOVIAL  Final    Comment: Performed at [facility], [address] [place], [place] [address]  Body fluid culture w Gram Stain     Status: None   Collection Time: 07/03/20 10:16 AM   Specimen: PATH Cytology Misc. fluid; Synovial Fluid  Result Value Ref Range Status   Specimen Description   Final    SYNOVIAL Performed at [facility], [address] [place], [place] [address]    Special Requests   Final    SYNOVIAL Performed at [facility], [place], [place] [address]    Gram Stain NO WBC SEEN NO ORGANISMS SEEN   Final   Culture   Final    NO GROWTH 3 DAYS Performed at [facility], [place] [address], [place],  [address]    Report Status 07/07/2020 FINAL  Final  Fungus Culture Result     Status: None   Collection Time: 07/03/20 10:16 AM  Result Value Ref Range Status   Result 1 Comment  Final    Comment: (NOTE) KOH/Calcofluor preparation:  no fungus observed. Performed At: [facility] [place], [place] [id number] Rush Farmer MD ZP:[id number]          Radiology Studies: US Venous Img Lower Unilateral Right (DVT)  Result Date: 07/07/2020 CLINICAL DATA:  87 year old female with right lower extremity swelling. EXAM: RIGHT LOWER EXTREMITY VENOUS DOPPLER ULTRASOUND TECHNIQUE: Gray-scale sonography with graded compression, as well as color Doppler and duplex ultrasound were  performed to evaluate the right lower extremity deep venous systems from the level of the common femoral vein and including the common femoral, femoral, profunda femoral, popliteal and calf veins including the posterior tibial, peroneal and gastrocnemius veins when visible. Spectral Doppler was utilized to evaluate flow at rest and with distal augmentation maneuvers in the common femoral, femoral  and popliteal veins. The contralateral common femoral vein was also evaluated for comparison. COMPARISON:  None. FINDINGS: RIGHT LOWER EXTREMITY Common Femoral Vein: No evidence of thrombus. Normal compressibility, respiratory phasicity and response to augmentation. Central Greater Saphenous Vein: No evidence of thrombus. Normal compressibility and flow on color Doppler imaging. Central Profunda Femoral Vein: No evidence of thrombus. Normal compressibility and flow on color Doppler imaging. Femoral Vein: Short-segment, nonocclusive focal thrombus in the central femoral vein. The femoral vein is patent in the mid and peripheral segments. Popliteal Vein: No evidence of thrombus. Normal compressibility, respiratory phasicity and response to augmentation. Calf Veins: No evidence of thrombus. Normal compressibility and flow on color Doppler imaging. Other Findings:  None. LEFT LOWER EXTREMITY Common Femoral Vein: No evidence of thrombus. Normal compressibility, respiratory phasicity and response to augmentation. IMPRESSION: Subacute appearing, non-occlusive focal thrombus limited to the central right femoral vein. Ruthann Cancer, MD [facility] Radiology Electronically Signed   By: Ruthann Cancer MD   On: 07/07/2020 11:40        Scheduled Meds: . amLODipine  5 mg Oral Daily  . brimonidine  1 drop Both Eyes Q12H   And  . timolol  1 drop Both Eyes Q12H  . enoxaparin (LOVENOX) injection  1 mg/kg Subcutaneous Q12H  . feeding supplement  237 mL Oral BID BM  . fentaNYL       . FLUoxetine  40 mg Oral Daily  . fluticasone furoate-vilanterol  1 puff Inhalation Daily  . gabapentin  100 mg Oral TID  . lidocaine  1 patch Transdermal Q24H  . montelukast  10 mg Oral Daily  . moxifloxacin  1 drop Both Eyes TID  . multivitamin with minerals  1 tablet Oral Daily  . nepafenac  1 drop Both Eyes Daily  . Netarsudil-Latanoprost  1 drop Both Eyes QPM  . pantoprazole  20 mg Oral Daily  . polyethylene glycol  17 g Oral Daily  . senna-docusate  2 tablet Oral BID  . sotalol  80 mg Oral Daily   Continuous Infusions:  Assessment & Plan:   Principal Problem:   Acute right hip pain Active Problems:   Moderate persistent asthma   Essential hypertension   GAD (generalized anxiety disorder)   Paroxysmal atrial fibrillation (HCC)   Chronic diastolic CHF (congestive heart failure) (HCC)   Malnutrition of moderate degree   #) Acute right hip pain:  causing a decline in ambulatory activity . Ortho consulted-input was appreciated-no evidence of fracture. There is stress shielding of the distal implant that may cause osteolysis of the lateral femur Need to rule out infection/osteomyelitis She is a poor surgical candidate and would prefer nonoperative management ID consulted input was appreciated-x-ray with cortical destruction with questionable infection/osteomyelitis ESR minimally elevated, CRP is 3.  No leukocytosis and afebrile 2/19-s/p Hip aspiration and cx sent by IR on 2/18.  Per IR, need Rt hip [place] to ck anterior hip for mass v.s. fluid, if negative, should get MR, if negative then bone bx.  PT rec. SNF 2/20-anterior Rt hip [place] completed. Awaiting f/u response with ortho Dr. Mack Guise about next step, if pt needs MR or not, he will review and let me know. -For now cultures pending, per ID if negative then consider empiric treatment with p.o. antibiotics with doxycycline with plus Keflex for a month 2/22-s/p bone bx/aspirate today by IR. IR ok to resume lovenox  tonight. Plan:  f/u results of bx and cx. -Also spoke to daughter this  am about pain meds, if continues with pain, need to do slow increase to prevent MS change or respiratory suppression. Daughter in agreement with the plan. She feels her pain is more controlled.   Rt thigh pain- obtained venous US with subacute appearing , nonocclusive focal thrombus in central Rt femoral vein  Per IR can start lovenox this evening as pt is s/p hip bx today Will consult Hematology Will consult pharmacy to start lovenox 69m/kg bid, with transition to Eliquis when no further procedures planned    #) Chronic diastolic failure: Documented history of such, with most recent echocardiogram in January 2013 showing normal left ventricular cavity size, LVEF 60 to 65% 2/22-compensated, no acute exacerbation I/o Daily weight     #) Paroxysmal atrial fibrillation: stable. On Eliquis , held for aspiration Spoke to IR Dr. WEarleen Newportok'd to resume. I will place her on lovenox for now incase there is any planned procedures in near future. 2/21-hold lovenox for procedure in am by IR      #) Essential hypertension: stable Mildly elevated 2/21-increase amlodipine to 565mdaily     #) Generalized anxiety disorder: Continue Prozac and Xanax as needed at home     #) Moderate persistent asthma: Outpatient respiratory regimen consists of scheduled Advair, Singulair.  No clinical evidence to suggest acute exacerbation.    #)Gerd-add ppi as pt c/o reflux sx  DVT prophylaxis: lovenox Code Status: Full Family Communication: daughter at bedside this am. (but tried calling daughter about venous usKoreahis after twice but no answer, left VM)  Status is: Inpatient  Remains inpatient appropriate because:Inpatient level of care appropriate due to severity of illness   Dispo: The patient is from: Home              Anticipated d/c is to: SNF              Anticipated d/c date is: 2 days              Patient  currently is not medically stable to d/c.w/u pending.  Hematology consulted   Difficult to place patient No            LOS: 6 days   Time spent: 35 min with >50% on coc    SaNolberto HanlonMD [facility] Pager [phone]  If 7PM-7AM, please contact night-coverage 07/07/2020, 1:30 PM

## 2020-07-07 NOTE — Care Management Important Message (Signed)
Important Message  Patient Details  Name: Michele Summers MRN: [id number] Date of Birth: Oct 19, 1931   Medicare Important Message Given:  Yes     Juliann Pulse A Allmond 07/07/2020, 11:28 AM

## 2020-07-07 NOTE — Plan of Care (Signed)
  Problem: Education: Goal: Knowledge of General Education information will improve Description: Including pain rating scale, medication(s)/side effects and non-pharmacologic comfort measures Outcome: Progressing   Problem: Clinical Measurements: Goal: Ability to maintain clinical measurements within normal limits will improve Outcome: Progressing Goal: Will remain free from infection Outcome: Progressing Goal: Diagnostic test results will improve Outcome: Progressing   Problem: Activity: Goal: Risk for activity intolerance will decrease Outcome: Progressing   Problem: Pain Managment: Goal: General experience of comfort will improve Outcome: Progressing   Problem: Safety: Goal: Ability to remain free from injury will improve Outcome: Progressing   Problem: Skin Integrity: Goal: Risk for impaired skin integrity will decrease Outcome: Progressing   

## 2020-07-08 ENCOUNTER — Telehealth: Payer: Self-pay | Admitting: Internal Medicine

## 2020-07-08 DIAGNOSIS — M25551 Pain in right hip: Secondary | ICD-10-CM | POA: Diagnosis not present

## 2020-07-08 LAB — CBC WITH DIFFERENTIAL/PLATELET
Abs Immature Granulocytes: 0.1 10*3/uL — ABNORMAL HIGH (ref 0.00–0.07)
Basophils Absolute: 0 10*3/uL (ref 0.0–0.1)
Basophils Relative: 0 %
Eosinophils Absolute: 0.2 10*3/uL (ref 0.0–0.5)
Eosinophils Relative: 2 %
HCT: 28.2 % — ABNORMAL LOW (ref 36.0–46.0)
Hemoglobin: 9.4 g/dL — ABNORMAL LOW (ref 12.0–15.0)
Immature Granulocytes: 1 %
Lymphocytes Relative: 10 %
Lymphs Abs: 1.1 10*3/uL (ref 0.7–4.0)
MCH: 26.8 pg (ref 26.0–34.0)
MCHC: 33.3 g/dL (ref 30.0–36.0)
MCV: 80.3 fL (ref 80.0–100.0)
Monocytes Absolute: 1 10*3/uL (ref 0.1–1.0)
Monocytes Relative: 9 %
Neutro Abs: 8.3 10*3/uL — ABNORMAL HIGH (ref 1.7–7.7)
Neutrophils Relative %: 78 %
Platelets: 472 10*3/uL — ABNORMAL HIGH (ref 150–400)
RBC: 3.51 MIL/uL — ABNORMAL LOW (ref 3.87–5.11)
RDW: 13.7 % (ref 11.5–15.5)
WBC: 10.8 10*3/uL — ABNORMAL HIGH (ref 4.0–10.5)
nRBC: 0 % (ref 0.0–0.2)

## 2020-07-08 LAB — BASIC METABOLIC PANEL
Anion gap: 7 (ref 5–15)
BUN: 29 mg/dL — ABNORMAL HIGH (ref 8–23)
CO2: 27 mmol/L (ref 22–32)
Calcium: 8.7 mg/dL — ABNORMAL LOW (ref 8.9–10.3)
Chloride: 98 mmol/L (ref 98–111)
Creatinine, Ser: 0.78 mg/dL (ref 0.44–1.00)
GFR, Estimated: >60 mL/min (ref >60)
Glucose, Bld: 112 mg/dL — ABNORMAL HIGH (ref 70–99)
Potassium: 4.7 mmol/L (ref 3.5–5.1)
Sodium: 132 mmol/L — ABNORMAL LOW (ref 135–145)

## 2020-07-08 MED ORDER — ACETAMINOPHEN 325 MG PO TABS: 650.0000 mg | ORAL_TABLET | Freq: Four times a day (QID) | ORAL | Status: DC | PRN

## 2020-07-08 MED ORDER — GABAPENTIN 100 MG PO CAPS
200.0000 mg | ORAL_CAPSULE | Freq: Three times a day (TID) | ORAL | Status: DC
  Administered 2020-07-08 – 2020-07-09 (×4): 200 mg via ORAL
  Filled 2020-07-08 (×4): qty 2

## 2020-07-08 MED ORDER — ACETAMINOPHEN 325 MG PO TABS
650.0000 mg | ORAL_TABLET | Freq: Three times a day (TID) | ORAL | Status: DC
  Administered 2020-07-08 – 2020-07-11 (×13): 650 mg via ORAL
  Filled 2020-07-08 (×13): qty 2

## 2020-07-08 MED ORDER — OXYCODONE HCL 5 MG PO TABS
5.0000 mg | ORAL_TABLET | ORAL | Status: DC | PRN
  Administered 2020-07-08: 5 mg via ORAL
  Administered 2020-07-09 – 2020-07-10 (×5): 10 mg via ORAL
  Filled 2020-07-08: qty 2
  Filled 2020-07-08: qty 1
  Filled 2020-07-08 (×4): qty 2

## 2020-07-08 MED ORDER — METHOCARBAMOL 500 MG PO TABS: 500.0000 mg | ORAL_TABLET | Freq: Four times a day (QID) | ORAL | Status: DC | PRN

## 2020-07-08 NOTE — Progress Notes (Signed)
PROGRESS NOTE    Michele Summers  CBS:[id number] DOB: May 22, 1931 DOA: 07/01/2020 PCP: [name], MD   Brief Narrative:  Michele Spalla Reeseis a 87 y.o.femalewith medical history significant forright hip fracture in 2013 status post right total hip arthroplasty,paroxysmal atrial fibrillation complicated by sick sinus syndrome status post pacemaker placement, hypertension, chronic diastolic heart failure, generalized anxiety disorder, moderatepersistentasthma, who [facility]with acute right hip painafter presenting [facility] complaining of right hip pain.  She confirms a history of right hip fracture back in 2013, at which time she underwent total right hip arthroplasty without anyreportedensuing complications from this procedure 2/17-Per Dr. [name] via messaging, need to have IR aspirate rt hip for gram stain and culture r/o infection as xray suggestive. 2/18-plan for aspiration today by IR.  2/19-spoke to Dr. [name] radiology he reported we should obtain US of anterior hip to r/o liquid or mass, if this was negative then consider MR for further evalatuion 2/20-rt hip anterior [place] completed. Send message to oncall ortho for review, if we need MR for further evaluation.  2/21-pain this am.spoke to ID and ortho, and radiology, plan to do another aspirate +/- hip bx in am as she got lovenox today 2/22: Status post IR guided biopsy of right thigh 2/23-communicated with IR and ID via secure chat.  Per IR thigh lesion was more solid and vascular then ultrasound had suggested.  Biopsies were taken and sent however no culture was sent.  Patient evaluated bedside.  Still with significant pain in the right thigh.   Assessment & Plan:   Principal Problem:   Acute right hip pain Active Problems:   Moderate persistent asthma   Essential hypertension   GAD (generalized anxiety disorder)   Paroxysmal atrial fibrillation (HCC)   Chronic diastolic CHF (congestive heart  failure) (HCC)   Malnutrition of moderate degree  Acute right hip and thigh pain Patient was apparently ambulatory 3 weeks prior Hip has caused decreased level of mobility in substantial pain Per Ortho no evidence of fracture and patient is a poor surgical candidate ID was consulted given concern for possible infectious etiology Patient without leukocytosis, afebrile, normal inflammatory markers Patient underwent IR guided biopsy of the right hip Plan: Still unable to entirely exclude infection Unfortunately no cultures were sent Defer antibiotics for now Multimodal pain control Continue therapy evaluations Follow pathology from biopsy  Nonocclusive DVT central right femoral vein Patient on Lovenox Defer hematology consult for now, will reconsult as necessary.  Discussed with Dr. [name] Plan: Continue Lovenox 1 mg/kg twice daily Transition to Eliquis when no further procedures are anticipated  Chronic diastolic congestive heart failure No indication for exacerbation currently Monitor clinically I's and O's Daily weights  Paroxysmal atrial fibrillation Currently rate controlled Continue Lovenox  Essential hypertension stable Continue amlodipine 5 mg daily  Generalized anxiety disorder Continue Prozac  Xanax as needed  Moderate persistent asthma Outpatient respiratory regimen consists of scheduled Advair, Singulair. No clinical evidence to suggest acute exacerbation.  GERD PPI    DVT prophylaxis: Lovenox mg/kg BID Code Status: FULL Family Communication: daughter at bedside Disposition Plan:Status is: Inpatient  Remains inpatient appropriate because:Inpatient level of care appropriate due to severity of illness   Dispo: The patient is from: Home              Anticipated d/c is to: SNF              Anticipated d/c date is: 3 days  Patient currently is not medically stable to d/c.   Difficult to place patient No  Patient is significant  amount of pain in the right thigh.  Etiology is not yet unclear.  Biopsy taken and results currently pending.  Disposition plan pending.  Anticipate need for skilled nursing facility.     Level of care: Med-Surg  Consultants:   ID  Orthopedics  IR  Procedures:   Right thigh mass biopsy, 2/22  Antimicrobials:   None   Subjective: Seen and examined.  Still with significant pain in the right thigh.  No other complaints.  Daughter at bedside  Objective: Vitals:   07/08/20 0500 07/08/20 0524 07/08/20 0748 07/08/20 1115  BP:  (!) 116/45 (!) 135/47 (!) 113/34  Pulse:  70 70 60  Resp:  16 16 16   Temp:  98.5 F (36.9 C) 99.2 F (37.3 C) 98.3 F (36.8 C)  TempSrc:   Oral Oral  SpO2:  92% 96% 96%  Weight: 70 kg     Height:        Intake/Output Summary (Last 24 hours) at 07/08/2020 1323 Last data filed at 07/08/2020 0940 Gross per 24 hour  Intake 537 ml  Output 1000 ml  Net -463 ml   Filed Weights   07/06/20 0651 07/07/20 0642 07/08/20 0500  Weight: 68 kg 68.8 kg 70 kg    Examination:  General exam: Appears calm and comfortable  Respiratory system: Clear to auscultation. Respiratory effort normal. Cardiovascular system: S1 & S2 heard, RRR. No JVD, murmurs, rubs, gallops or clicks. No pedal edema. Gastrointestinal system: Abdomen is nondistended, soft and nontender. No organomegaly or masses felt. Normal bowel sounds heard. Central nervous system: Alert and oriented. No focal neurological deficits. Extremities: Decreased power right lower extremity Skin: Area of ecchymoses right thigh, tender to palpation Psychiatry: Judgement and insight appear normal. Mood & affect appropriate.     Data Reviewed: I have personally reviewed following labs and imaging studies  CBC: Recent Labs  Lab 07/01/20 1857 07/02/20 0417 07/04/20 1413 07/07/20 0556 07/08/20 0933  WBC 5.2 6.5 8.8 11.3* 10.8*  NEUTROABS  --   --   --   --  8.3*  HGB 11.0* 10.7* 10.6* 10.7* 9.4*   HCT 34.8* 33.4* 32.9* 34.4* 28.2*  MCV 82.9 82.5 81.6 84.5 80.3  PLT 350 328 369 421* 559*   Basic Metabolic Panel: Recent Labs  Lab 07/01/20 1857 07/02/20 0417 07/04/20 1413 07/07/20 0556 07/08/20 0933  NA 136 136  --   --  132*  K 4.3 4.0  --   --  4.7  CL 104 104  --   --  98  CO2 22 24  --   --  27  GLUCOSE 244* 204*  --   --  112*  BUN 16 18  --   --  29*  CREATININE 0.80 0.92 0.74 0.98 0.78  CALCIUM 8.8* 8.9  --   --  8.7*  MG 1.9 1.9  --   --   --    GFR: Estimated Creatinine Clearance: 48.5 mL/min (by C-G formula based on SCr of 0.78 mg/dL). Liver Function Tests: No results for input(s): AST, ALT, ALKPHOS, BILITOT, PROT, ALBUMIN in the last 168 hours. No results for input(s): LIPASE, AMYLASE in the last 168 hours. No results for input(s): AMMONIA in the last 168 hours. Coagulation Profile: Recent Labs  Lab 07/04/20 1413  INR 1.1   Cardiac Enzymes: No results for input(s): CKTOTAL, CKMB, CKMBINDEX, TROPONINI in the  last 168 hours. BNP (last 3 results) No results for input(s): PROBNP in the last 8760 hours. HbA1C: No results for input(s): HGBA1C in the last 72 hours. CBG: No results for input(s): GLUCAP in the last 168 hours. Lipid Profile: No results for input(s): CHOL, HDL, LDLCALC, TRIG, CHOLHDL, LDLDIRECT in the last 72 hours. Thyroid Function Tests: No results for input(s): TSH, T4TOTAL, FREET4, T3FREE, THYROIDAB in the last 72 hours. Anemia Panel: No results for input(s): VITAMINB12, FOLATE, FERRITIN, TIBC, IRON, RETICCTPCT in the last 72 hours. Sepsis Labs: No results for input(s): PROCALCITON, LATICACIDVEN in the last 168 hours.  Recent Results (from the past 240 hour(s))  Resp Panel by RT-PCR (Flu A&B, Covid) Nasopharyngeal Swab     Status: None   Collection Time: 07/01/20  4:45 PM   Specimen: Nasopharyngeal Swab; Nasopharyngeal(NP) swabs in vial transport medium  Result Value Ref Range Status   SARS Coronavirus 2 by RT PCR NEGATIVE NEGATIVE  Final    Comment: (NOTE) SARS-CoV-2 target nucleic acids are NOT DETECTED.  The SARS-CoV-2 RNA is generally detectable in upper respiratory specimens during the acute phase of infection. The lowest concentration of SARS-CoV-2 viral copies this assay can detect is 138 copies/mL. A negative result does not preclude SARS-Cov-2 infection and should not be used as the sole basis for treatment or other patient management decisions. A negative result may occur with  improper specimen collection/handling, submission of specimen other than nasopharyngeal swab, presence of viral mutation(s) within the areas targeted by this assay, and inadequate number of viral copies(<138 copies/mL). A negative result must be combined with clinical observations, patient history, and epidemiological information. The expected result is Negative.  Fact Sheet for Patients:  EntrepreneurPulse.com.au  Fact Sheet for Healthcare Providers:  IncredibleEmployment.be  This test is no t yet approved or cleared by the [place] FDA and  has been authorized for detection and/or diagnosis of SARS-CoV-2 by FDA under an Emergency Use Authorization (EUA). This EUA will remain  in effect (meaning this test can be used) for the duration of the COVID-19 declaration under Section 564(b)(1) of the Act, 21 U.S.C.section 360bbb-3(b)(1), unless the authorization is terminated  or revoked sooner.       Influenza A by PCR NEGATIVE NEGATIVE Final   Influenza B by PCR NEGATIVE NEGATIVE Final    Comment: (NOTE) The Xpert Xpress SARS-CoV-2/FLU/RSV plus assay is intended as an aid in the diagnosis of influenza from Nasopharyngeal swab specimens and should not be used as a sole basis for treatment. Nasal washings and aspirates are unacceptable for Xpert Xpress SARS-CoV-2/FLU/RSV testing.  Fact Sheet for Patients: EntrepreneurPulse.com.au  Fact Sheet for Healthcare  Providers: IncredibleEmployment.be  This test is not yet approved or cleared by the [place] FDA and has been authorized for detection and/or diagnosis of SARS-CoV-2 by FDA under an Emergency Use Authorization (EUA). This EUA will remain in effect (meaning this test can be used) for the duration of the COVID-19 declaration under Section 564(b)(1) of the Act, 21 U.S.C. section 360bbb-3(b)(1), unless the authorization is terminated or revoked.  Performed at [facility], [address] [place], [place] [address]   Culture, blood (Routine X 2) w Reflex to ID Panel     Status: None   Collection Time: 07/02/20  4:24 PM   Specimen: BLOOD  Result Value Ref Range Status   Specimen Description BLOOD RIGHT ANTECUBITAL  Final   Special Requests   Final    BOTTLES DRAWN AEROBIC AND ANAEROBIC Blood Culture adequate volume  Culture   Final    NO GROWTH 5 DAYS Performed at [facility], [address] [place], [place] [address]    Report Status 07/07/2020 FINAL  Final  Culture, blood (Routine X 2) w Reflex to ID Panel     Status: None   Collection Time: 07/02/20  4:24 PM   Specimen: BLOOD  Result Value Ref Range Status   Specimen Description BLOOD LEFT ANTECUBITAL  Final   Special Requests   Final    BOTTLES DRAWN AEROBIC AND ANAEROBIC Blood Culture adequate volume   Culture   Final    NO GROWTH 5 DAYS Performed at [facility], [address], [place], [place] [address]    Report Status 07/07/2020 FINAL  Final  Fungus Culture With Stain     Status: None (Preliminary result)   Collection Time: 07/03/20 10:16 AM   Specimen: PATH Cytology Misc. fluid; Synovial Fluid  Result Value Ref Range Status   Fungus Stain Final report  Final    Comment: (NOTE) Performed At: [facility] [place], [place] [id number] Rush Farmer MD [id number]    Fungus (Mycology) Culture PENDING  Incomplete   Fungal Source SYNOVIAL   Final    Comment: Performed at [facility], [address] [place], [place] [address]  Body fluid culture w Gram Stain     Status: None   Collection Time: 07/03/20 10:16 AM   Specimen: PATH Cytology Misc. fluid; Synovial Fluid  Result Value Ref Range Status   Specimen Description   Final    SYNOVIAL Performed at [facility], [address] [place], [place] [address]    Special Requests   Final    SYNOVIAL Performed at [facility], [place], [place] [address]    Gram Stain NO WBC SEEN NO ORGANISMS SEEN   Final   Culture   Final    NO GROWTH 3 DAYS Performed at [facility], [place] [address], [place],  [address]    Report Status 07/07/2020 FINAL  Final  Fungus Culture Result     Status: None   Collection Time: 07/03/20 10:16 AM  Result Value Ref Range Status   Result 1 Comment  Final    Comment: (NOTE) KOH/Calcofluor preparation:  no fungus observed. Performed At: [facility] [place], [place] [id number] Rush Farmer MD OM:[id number]          Radiology Studies: US Venous Img Lower Unilateral Right (DVT)  Result Date: 07/07/2020 CLINICAL DATA:  87 year old female with right lower extremity swelling. EXAM: RIGHT LOWER EXTREMITY VENOUS DOPPLER ULTRASOUND TECHNIQUE: Gray-scale sonography with graded compression, as well as color Doppler and duplex ultrasound were performed to evaluate the right lower extremity deep venous systems from the level of the common femoral vein and including the common femoral, femoral, profunda femoral, popliteal and calf veins including the posterior tibial, peroneal and gastrocnemius veins when visible. Spectral Doppler was utilized to evaluate flow at rest and with distal augmentation maneuvers in the common femoral, femoral and popliteal veins. The contralateral common femoral vein was also evaluated for comparison. COMPARISON:  None. FINDINGS: RIGHT LOWER  EXTREMITY Common Femoral Vein: No evidence of thrombus. Normal compressibility, respiratory phasicity and response to augmentation. Central Greater Saphenous Vein: No evidence of thrombus. Normal compressibility and flow on color Doppler imaging. Central Profunda Femoral Vein: No evidence of thrombus. Normal compressibility and flow on color Doppler imaging. Femoral Vein: Short-segment, nonocclusive focal thrombus in  the central femoral vein. The femoral vein is patent in the mid and peripheral segments. Popliteal Vein: No evidence of thrombus. Normal compressibility, respiratory phasicity and response to augmentation. Calf Veins: No evidence of thrombus. Normal compressibility and flow on color Doppler imaging. Other Findings:  None. LEFT LOWER EXTREMITY Common Femoral Vein: No evidence of thrombus. Normal compressibility, respiratory phasicity and response to augmentation. IMPRESSION: Subacute appearing, non-occlusive focal thrombus limited to the central right femoral vein. Ruthann Cancer, MD [facility] Electronically Signed   By: Ruthann Cancer MD   On: 07/07/2020 11:40   [place] CORE BIOPSY (SOFT TISSUE)  Result Date: 07/07/2020 INDICATION: 87 year old woman with right thigh mass/fluid collection presents to interventional radiology for ultrasound-guided aspiration/biopsy EXAM: Ultrasound-guided aspiration and biopsy of right upper thigh mass/fluid collection MEDICATIONS: None. ANESTHESIA/SEDATION: Moderate (conscious) sedation was employed during this procedure. A total of Versed 0 mg and Fentanyl 25 mcg was administered intravenously. Moderate Sedation Time: 10 minutes. The patient's level of consciousness and vital signs were monitored continuously by radiology nursing throughout the procedure under my direct supervision. COMPLICATIONS: None immediate. PROCEDURE: Informed written consent was obtained from the patient after a thorough discussion of the  procedural risks, benefits and alternatives. All questions were addressed. Maximal Sterile Barrier Technique was utilized including caps, mask, sterile gowns, sterile gloves, sterile drape, hand hygiene and skin antiseptic. A timeout was performed prior to the initiation of the procedure. Patient position supine on the ultrasound table. Right upper thigh skin prepped and draped in usual sterile fashion. Ultrasound evaluation of the right upper thigh again demonstrated the hypoechoic collection/mass. Following local lidocaine administration, 8 [place] Yueh needle was advanced into the lesion, however no fluid could be aspirated. 17 gauge introducer needle was then advanced into the right upper thigh lesion, and four 18 gauge cores were obtained utilizing continuous ultrasound guidance. Samples were sent to pathology in formalin. Needle removed and hemostasis achieved with 2 minutes of manual compression. Post procedure ultrasound images showed no evidence of significant hemorrhage. IMPRESSION: Ultrasound-guided biopsy of right thigh mass Electronically Signed   By: Miachel Roux M.D.   On: 07/07/2020 16:03   [place] FINE NEEDLE ASP 1ST LESION  Result Date: 07/07/2020 INDICATION: 87 year old woman with right thigh mass/fluid collection presents to interventional radiology for ultrasound-guided aspiration/biopsy EXAM: Ultrasound-guided aspiration and biopsy of right upper thigh mass/fluid collection MEDICATIONS: None. ANESTHESIA/SEDATION: Moderate (conscious) sedation was employed during this procedure. A total of Versed 0 mg and Fentanyl 25 mcg was administered intravenously. Moderate Sedation Time: 10 minutes. The patient's level of consciousness and vital signs were monitored continuously by radiology nursing throughout the procedure under my direct supervision. COMPLICATIONS: None immediate. PROCEDURE: Informed written consent was obtained from the patient after a thorough discussion of the procedural risks, benefits  and alternatives. All questions were addressed. Maximal Sterile Barrier Technique was utilized including caps, mask, sterile gowns, sterile gloves, sterile drape, hand hygiene and skin antiseptic. A timeout was performed prior to the initiation of the procedure. Patient position supine on the ultrasound table. Right upper thigh skin prepped and draped in usual sterile fashion. Ultrasound evaluation of the right upper thigh again demonstrated the hypoechoic collection/mass. Following local lidocaine administration, 8 [place] Yueh needle was advanced into the lesion, however no fluid could be aspirated. 17 gauge introducer needle was then advanced into the right upper thigh lesion, and four 18 gauge cores were obtained utilizing continuous ultrasound guidance. Samples were sent to pathology in formalin. Needle removed and  hemostasis achieved with 2 minutes of manual compression. Post procedure ultrasound images showed no evidence of significant hemorrhage. IMPRESSION: Ultrasound-guided biopsy of right thigh mass Electronically Signed   By: Miachel Roux M.D.   On: 07/07/2020 16:03        Scheduled Meds: . acetaminophen  650 mg Oral TID AC & HS  . brimonidine  1 drop Both Eyes Q12H   And  . timolol  1 drop Both Eyes Q12H  . enoxaparin (LOVENOX) injection  1 mg/kg Subcutaneous Q12H  . feeding supplement  237 mL Oral BID BM  . FLUoxetine  40 mg Oral Daily  . fluticasone furoate-vilanterol  1 puff Inhalation Daily  . gabapentin  200 mg Oral TID  . lidocaine  1 patch Transdermal Q24H  . montelukast  10 mg Oral Daily  . moxifloxacin  1 drop Both Eyes TID  . multivitamin with minerals  1 tablet Oral Daily  . nepafenac  1 drop Both Eyes Daily  . Netarsudil-Latanoprost  1 drop Both Eyes QPM  . pantoprazole  20 mg Oral Daily  . polyethylene glycol  17 g Oral Daily  . senna-docusate  2 tablet Oral BID  . sotalol  80 mg Oral Daily   Continuous Infusions:   LOS: 7 days    Time spent: 35  minutes    Sidney Ace, MD [facility] Pager [phone]  If 7PM-7AM, please contact night-coverage 07/08/2020, 1:23 PM

## 2020-07-08 NOTE — Telephone Encounter (Signed)
On 2/23- discussed with Dr.Sreenath- will hold off hematology eval for now. Will re-consult if the thigh mass is malignant.  GB

## 2020-07-08 NOTE — Progress Notes (Signed)
Date of Admission:  07/01/2020     ID: Michele Summers is a 87 y.o. female  Principal Problem:   Acute right hip pain Active Problems:   Moderate persistent asthma   Essential hypertension   GAD (generalized anxiety disorder)   Paroxysmal atrial fibrillation (HCC)   Chronic diastolic CHF (congestive heart failure) (HCC)   Malnutrition of moderate degree    Subjective: Pt stable Some pain rt thigh Daughter at bed side  Medications:  . acetaminophen  650 mg Oral TID AC & HS  . amLODipine  5 mg Oral Daily  . brimonidine  1 drop Both Eyes Q12H   And  . timolol  1 drop Both Eyes Q12H  . enoxaparin (LOVENOX) injection  1 mg/kg Subcutaneous Q12H  . feeding supplement  237 mL Oral BID BM  . FLUoxetine  40 mg Oral Daily  . fluticasone furoate-vilanterol  1 puff Inhalation Daily  . gabapentin  200 mg Oral TID  . lidocaine  1 patch Transdermal Q24H  . montelukast  10 mg Oral Daily  . moxifloxacin  1 drop Both Eyes TID  . multivitamin with minerals  1 tablet Oral Daily  . nepafenac  1 drop Both Eyes Daily  . Netarsudil-Latanoprost  1 drop Both Eyes QPM  . pantoprazole  20 mg Oral Daily  . polyethylene glycol  17 g Oral Daily  . senna-docusate  2 tablet Oral BID  . sotalol  80 mg Oral Daily    Objective: Vital signs in last 24 hours: Temp:  [98.3 F (36.8 C)-99.2 F (37.3 C)] 98.3 F (36.8 C) (02/23 1115) Pulse Rate:  [60-182] 60 (02/23 1115) Resp:  [13-19] 16 (02/23 1115) BP: (87-135)/(34-60) 113/34 (02/23 1115) SpO2:  [91 %-99 %] 96 % (02/23 1115) Weight:  [70 kg] 70 kg (02/23 0500)  PHYSICAL EXAM:  General: Alert, cooperative, no distress, appears stated age.  Head: Normocephalic, without obvious abnormality, atraumatic. Eyes: conjuctival redness Lungs: Clear to auscultation bilaterally. No Wheezing or Rhonchi. No rales. Heart: Regular rate and rhythm, no murmur, rub or gallop. Abdomen: Soft, non-tender,not distended. Bowel sounds normal. No masses Extremities:rt  thigh brusing at the sit eof biopsy Some swelling of the rt upper thigh Skin: No rashes or lesions. Or bruising Lymph: Cervical, supraclavicular normal. Neurologic: Grossly non-focal  Lab Results Recent Labs    07/07/20 0556 07/08/20 0933  WBC 11.3* 10.8*  HGB 10.7* 9.4*  HCT 34.4* 28.2*  NA  --  132*  K  --  4.7  CL  --  98  CO2  --  27  BUN  --  29*  CREATININE 0.98 0.78   Liver Panel No results for input(s): PROT, ALBUMIN, AST, ALT, ALKPHOS, BILITOT, BILIDIR, IBILI in the last 72 hours. Sedimentation Rate No results for input(s): ESRSEDRATE in the last 72 hours. C-Reactive Protein No results for input(s): CRP in the last 72 hours.  Microbiology:  Studies/Results: US Venous Img Lower Unilateral Right (DVT)  Result Date: 07/07/2020 CLINICAL DATA:  87 year old female with right lower extremity swelling. EXAM: RIGHT LOWER EXTREMITY VENOUS DOPPLER ULTRASOUND TECHNIQUE: Gray-scale sonography with graded compression, as well as color Doppler and duplex ultrasound were performed to evaluate the right lower extremity deep venous systems from the level of the common femoral vein and including the common femoral, femoral, profunda femoral, popliteal and calf veins including the posterior tibial, peroneal and gastrocnemius veins when visible. Spectral Doppler was utilized to evaluate flow at rest and with distal augmentation maneuvers in  the common femoral, femoral and popliteal veins. The contralateral common femoral vein was also evaluated for comparison. COMPARISON:  None. FINDINGS: RIGHT LOWER EXTREMITY Common Femoral Vein: No evidence of thrombus. Normal compressibility, respiratory phasicity and response to augmentation. Central Greater Saphenous Vein: No evidence of thrombus. Normal compressibility and flow on color Doppler imaging. Central Profunda Femoral Vein: No evidence of thrombus. Normal compressibility and flow on color Doppler imaging. Femoral Vein: Short-segment, nonocclusive  focal thrombus in the central femoral vein. The femoral vein is patent in the mid and peripheral segments. Popliteal Vein: No evidence of thrombus. Normal compressibility, respiratory phasicity and response to augmentation. Calf Veins: No evidence of thrombus. Normal compressibility and flow on color Doppler imaging. Other Findings:  None. LEFT LOWER EXTREMITY Common Femoral Vein: No evidence of thrombus. Normal compressibility, respiratory phasicity and response to augmentation. IMPRESSION: Subacute appearing, non-occlusive focal thrombus limited to the central right femoral vein. Ruthann Cancer, MD [facility] Electronically Signed   By: Ruthann Cancer MD   On: 07/07/2020 11:40   [place] CORE BIOPSY (SOFT TISSUE)  Result Date: 07/07/2020 INDICATION: 87 year old woman with right thigh mass/fluid collection presents to interventional radiology for ultrasound-guided aspiration/biopsy EXAM: Ultrasound-guided aspiration and biopsy of right upper thigh mass/fluid collection MEDICATIONS: None. ANESTHESIA/SEDATION: Moderate (conscious) sedation was employed during this procedure. A total of Versed 0 mg and Fentanyl 25 mcg was administered intravenously. Moderate Sedation Time: 10 minutes. The patient's level of consciousness and vital signs were monitored continuously by radiology nursing throughout the procedure under my direct supervision. COMPLICATIONS: None immediate. PROCEDURE: Informed written consent was obtained from the patient after a thorough discussion of the procedural risks, benefits and alternatives. All questions were addressed. Maximal Sterile Barrier Technique was utilized including caps, mask, sterile gowns, sterile gloves, sterile drape, hand hygiene and skin antiseptic. A timeout was performed prior to the initiation of the procedure. Patient position supine on the ultrasound table. Right upper thigh skin prepped and draped in usual sterile  fashion. Ultrasound evaluation of the right upper thigh again demonstrated the hypoechoic collection/mass. Following local lidocaine administration, 8 [place] Yueh needle was advanced into the lesion, however no fluid could be aspirated. 17 gauge introducer needle was then advanced into the right upper thigh lesion, and four 18 gauge cores were obtained utilizing continuous ultrasound guidance. Samples were sent to pathology in formalin. Needle removed and hemostasis achieved with 2 minutes of manual compression. Post procedure ultrasound images showed no evidence of significant hemorrhage. IMPRESSION: Ultrasound-guided biopsy of right thigh mass Electronically Signed   By: Miachel Roux M.D.   On: 07/07/2020 16:03   [place] FINE NEEDLE ASP 1ST LESION  Result Date: 07/07/2020 INDICATION: 87 year old woman with right thigh mass/fluid collection presents to interventional radiology for ultrasound-guided aspiration/biopsy EXAM: Ultrasound-guided aspiration and biopsy of right upper thigh mass/fluid collection MEDICATIONS: None. ANESTHESIA/SEDATION: Moderate (conscious) sedation was employed during this procedure. A total of Versed 0 mg and Fentanyl 25 mcg was administered intravenously. Moderate Sedation Time: 10 minutes. The patient's level of consciousness and vital signs were monitored continuously by radiology nursing throughout the procedure under my direct supervision. COMPLICATIONS: None immediate. PROCEDURE: Informed written consent was obtained from the patient after a thorough discussion of the procedural risks, benefits and alternatives. All questions were addressed. Maximal Sterile Barrier Technique was utilized including caps, mask, sterile gowns, sterile gloves, sterile drape, hand hygiene and skin antiseptic. A timeout was performed prior to the initiation of the procedure. Patient position supine on the  ultrasound table. Right upper thigh skin prepped and draped in usual sterile fashion. Ultrasound  evaluation of the right upper thigh again demonstrated the hypoechoic collection/mass. Following local lidocaine administration, 8 [place] Yueh needle was advanced into the lesion, however no fluid could be aspirated. 17 gauge introducer needle was then advanced into the right upper thigh lesion, and four 18 gauge cores were obtained utilizing continuous ultrasound guidance. Samples were sent to pathology in formalin. Needle removed and hemostasis achieved with 2 minutes of manual compression. Post procedure ultrasound images showed no evidence of significant hemorrhage. IMPRESSION: Ultrasound-guided biopsy of right thigh mass Electronically Signed   By: Miachel Roux M.D.   On: 07/07/2020 16:03     Assessment/Plan: Acute onset right hip and thigh pain at the site of right hemiarthroplasty.  Initial x-ray had some lucency with cortical destruction of the proximal femur around the hip replacement stem concerning for infection/osteomyelitis.  The right hip aspiration was a dry tap. An ultrasound done on 07/04/2020 showed a fluid collection of 13 cm.  On 07/07/2020 she underwent aspiration but no fluid was obtained and so core biopsy was done.  Interventional radiology did not send any culture.  He thought it could be malignancy rather than infection. We will discuss with pathologist to make sure to do special stains if there is no malignancy . As per Dr.Rubinas this could be a proliferative myositis, waiting for report  Cardiac pacemaker  Paroxysmal Afib on sotalol  Anemia  HTN  Discussed the management with the patient and her daughter Discussed with care team

## 2020-07-09 DIAGNOSIS — M6080 Other myositis, unspecified site: Secondary | ICD-10-CM

## 2020-07-09 DIAGNOSIS — M25551 Pain in right hip: Secondary | ICD-10-CM | POA: Diagnosis not present

## 2020-07-09 DIAGNOSIS — R2241 Localized swelling, mass and lump, right lower limb: Secondary | ICD-10-CM

## 2020-07-09 LAB — CBC WITH DIFFERENTIAL/PLATELET
Abs Immature Granulocytes: 0.1 K/uL — ABNORMAL HIGH (ref 0.00–0.07)
Basophils Absolute: 0.1 K/uL (ref 0.0–0.1)
Basophils Relative: 1 %
Eosinophils Absolute: 0.3 K/uL (ref 0.0–0.5)
Eosinophils Relative: 4 %
HCT: 27.5 % — ABNORMAL LOW (ref 36.0–46.0)
Hemoglobin: 8.8 g/dL — ABNORMAL LOW (ref 12.0–15.0)
Immature Granulocytes: 1 %
Lymphocytes Relative: 13 %
Lymphs Abs: 1.2 K/uL (ref 0.7–4.0)
MCH: 26.4 pg (ref 26.0–34.0)
MCHC: 32 g/dL (ref 30.0–36.0)
MCV: 82.6 fL (ref 80.0–100.0)
Monocytes Absolute: 0.8 K/uL (ref 0.1–1.0)
Monocytes Relative: 9 %
Neutro Abs: 6.7 K/uL (ref 1.7–7.7)
Neutrophils Relative %: 72 %
Platelets: 464 K/uL — ABNORMAL HIGH (ref 150–400)
RBC: 3.33 MIL/uL — ABNORMAL LOW (ref 3.87–5.11)
RDW: 13.5 % (ref 11.5–15.5)
WBC: 9.2 K/uL (ref 4.0–10.5)
nRBC: 0 % (ref 0.0–0.2)

## 2020-07-09 LAB — BASIC METABOLIC PANEL WITH GFR
Anion gap: 6 (ref 5–15)
BUN: 27 mg/dL — ABNORMAL HIGH (ref 8–23)
CO2: 28 mmol/L (ref 22–32)
Calcium: 8.7 mg/dL — ABNORMAL LOW (ref 8.9–10.3)
Chloride: 99 mmol/L (ref 98–111)
Creatinine, Ser: 0.73 mg/dL (ref 0.44–1.00)
GFR, Estimated: >60 mL/min (ref >60)
Glucose, Bld: 104 mg/dL — ABNORMAL HIGH (ref 70–99)
Potassium: 4.5 mmol/L (ref 3.5–5.1)
Sodium: 133 mmol/L — ABNORMAL LOW (ref 135–145)

## 2020-07-09 LAB — SURGICAL PATHOLOGY

## 2020-07-09 MED ORDER — METHOCARBAMOL 500 MG PO TABS
750.0000 mg | ORAL_TABLET | Freq: Three times a day (TID) | ORAL | Status: DC
  Administered 2020-07-09 – 2020-07-11 (×6): 750 mg via ORAL
  Filled 2020-07-09 (×7): qty 2

## 2020-07-09 MED ORDER — NEPRO/CARBSTEADY PO LIQD
237.0000 mL | Freq: Three times a day (TID) | ORAL | Status: DC
  Administered 2020-07-09 – 2020-07-11 (×4): 237 mL via ORAL

## 2020-07-09 NOTE — Progress Notes (Signed)
PROGRESS NOTE    HAVANNAH Summers  FXT:[id number] DOB: [birth year] DOA: 07/01/2020 PCP: [name], MD   Brief Narrative:  Michele Summers a 87 y.o.femalewith medical history significant forright hip fracture in 2013 status post right total hip arthroplasty,paroxysmal atrial fibrillation complicated by sick sinus syndrome status post pacemaker placement, hypertension, chronic diastolic heart failure, generalized anxiety disorder, moderatepersistentasthma, who [facility]with acute right hip painafter presenting to [facility] complaining of right hip pain.  She confirms a history of right hip fracture back in 2013, at which time she underwent total right hip arthroplasty without anyreportedensuing complications from this procedure 2/17-Per Dr. [name] via messaging, need to have IR aspirate rt hip for gram stain and culture r/o infection as xray suggestive. 2/18-plan for aspiration today by IR.  2/19-spoke to Dr. [name] radiology he reported we should obtain US of anterior hip to r/o liquid or mass, if this was negative then consider MR for further evalatuion 2/20-rt hip anterior [place] completed. Send message to oncall ortho for review, if we need MR for further evaluation.  2/21-pain this am.spoke to ID and ortho, and radiology, plan to do another aspirate +/- hip bx in am as she got lovenox today 2/22: Status post IR guided biopsy of right thigh 2/23-communicated with IR and ID via secure chat.  Per IR thigh lesion was more solid and vascular then ultrasound had suggested.  Biopsies were taken and sent however no culture was sent.  Patient evaluated bedside.  Still with significant pain in the right thigh. 2/24: Pain control seems to be slowly improving. Patient had some issues with delirium and mental status changes. Appears to back to baseline this morning. Communicated with pathology. 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 after core biopsy Per literature review this is a benign rapidly growing soft tissue mass Surgical resection does appear to be an option however per literature review this also could be self-limited over the course of a couple months I have communicated with patient and family regarding this diagnosis Plan: Pain control Once pain control is optimized plan on discharge to skilled nursing facility I have provided names of 2 orthopedic oncologist to patient's family wanted [facility] and 1 at [facility] If we can get pain control optimized patient can discharge to skilled nursing facility with outpatient follow-up with 1 of these providers  Acute right hip and thigh pain Patient was apparently ambulatory 3 weeks prior Hip has caused decreased level of mobility in substantial pain Per Ortho no evidence of fracture and patient is a poor surgical candidate ID was consulted given concern for possible infectious etiology Patient without leukocytosis, afebrile, normal inflammatory markers Patient underwent IR guided biopsy of the right hip Biopsy positive for proliferative myositis/fasciitis Infection or malignancy appears to be off differential at this time Plan: Multimodal pain control Therapy evaluations Discharge to SNF once pain control optimized  Nonocclusive DVT central right femoral vein Patient on Lovenox Defer hematology consult for now, will reconsult as necessary.   Discussed with Dr. [name] Plan: Continue Lovenox 1 mg/kg twice daily Transition to Eliquis when no further procedures are anticipated  Can likely transition  within 24  hours  Chronic diastolic congestive heart failure No indication for exacerbation currently Monitor clinically I's and O's Daily weights  Paroxysmal atrial fibrillation Currently rate controlled Continue Lovenox  Essential hypertension stable Continue amlodipine 5 mg daily  Generalized anxiety disorder Continue Prozac  Xanax as needed  Moderate persistent asthma Outpatient respiratory regimen consists of scheduled Advair, Singulair. No clinical evidence to suggest acute exacerbation.  GERD PPI    DVT prophylaxis: Lovenox mg/kg BID Code Status: FULL Family Communication: daughter at bedside Disposition Plan:Status is: Inpatient  Remains inpatient appropriate because:Inpatient level of care appropriate due to severity of illness   Dispo: The patient is from: Home              Anticipated d/c is to: SNF              Anticipated d/c date is: 3 days              Patient currently is not medically stable to d/c.   Difficult to place patient No  Pain control appears to be slowly improving. Etiology likely proliferative myositis/fasciitis. Current plan of care to discharge to skilled nursing facility with plans to follow-up with orthopedic oncology at [facility] or [facility]     Level of care: Med-Surg  Consultants:   ID  Orthopedics  IR  Procedures:   Right thigh mass biopsy, 2/22  Antimicrobials:   None   Subjective: Patient seen and examined. Daughter remains at bedside. Pain control appears to be slightly improved.  Objective: Vitals:   07/08/20 2355 07/09/20 0500 07/09/20 0758 07/09/20 1144  BP: (!) 104/41 (!) 152/48 (!) 120/50 (!) 103/42  Pulse: 67 63 76 66  Resp: 18 18 17 18   Temp: 98.4 F (36.9 C) 99.1 F (37.3 C) 98.3 F (36.8 C) 98.2 F (36.8 C)  TempSrc: Oral Oral    SpO2: 93% 97% 94% 96%  Weight:  69.9 kg    Height:        Intake/Output Summary (Last 24 hours) at 07/09/2020 1353 Last data filed at 07/09/2020 1343 Gross per 24 hour   Intake 480 ml  Output 450 ml  Net 30 ml   Filed Weights   07/07/20 0642 07/08/20 0500 07/09/20 0500  Weight: 68.8 kg 70 kg 69.9 kg    Examination:  General exam: Appears calm and comfortable  Respiratory system: Clear to auscultation. Respiratory effort normal. Cardiovascular system: S1 & S2 heard, RRR. No JVD, murmurs, rubs, gallops or clicks. No pedal edema. Gastrointestinal system: Abdomen is nondistended, soft and nontender. No organomegaly or masses felt. Normal bowel sounds heard. Central nervous system: Alert and oriented. No focal neurological deficits. Extremities: Decreased power right lower extremity Skin: Area of ecchymoses right thigh, tender to palpation Psychiatry: Judgement and insight appear normal. Mood & affect appropriate.     Data Reviewed: I have personally reviewed following labs and imaging studies  CBC: Recent Labs  Lab 07/04/20 1413 07/07/20 0556 07/08/20 0933 07/09/20 0448  WBC 8.8 11.3* 10.8* 9.2  NEUTROABS  --   --  8.3* 6.7  HGB 10.6* 10.7* 9.4* 8.8*  HCT 32.9* 34.4* 28.2* 27.5*  MCV 81.6 84.5 80.3 82.6  PLT 369 421* 472* 716*   Basic Metabolic Panel: Recent Labs  Lab 07/04/20 1413 07/07/20 0556 07/08/20 0933 07/09/20 0448  NA  --   --  132* 133*  K  --   --  4.7 4.5  CL  --   --  98 99  CO2  --   --  27 28  GLUCOSE  --   --  112* 104*  BUN  --   --  29* 27*  CREATININE 0.74 0.98 0.78 0.73  CALCIUM  --   --  8.7* 8.7*   GFR: Estimated Creatinine Clearance: 48.5 mL/min (by C-G formula based on SCr of 0.73 mg/dL). Liver Function Tests: No results for input(s): AST, ALT, ALKPHOS, BILITOT, PROT, ALBUMIN in the last 168 hours. No results for input(s): LIPASE, AMYLASE in the last 168 hours. No results for input(s): AMMONIA in the last 168 hours. Coagulation Profile: Recent Labs  Lab 07/04/20 1413  INR 1.1   Cardiac Enzymes: No results for input(s): CKTOTAL, CKMB, CKMBINDEX, TROPONINI in the last 168 hours. BNP (last 3  results) No results for input(s): PROBNP in the last 8760 hours. HbA1C: No results for input(s): HGBA1C in the last 72 hours. CBG: No results for input(s): GLUCAP in the last 168 hours. Lipid Profile: No results for input(s): CHOL, HDL, LDLCALC, TRIG, CHOLHDL, LDLDIRECT in the last 72 hours. Thyroid Function Tests: No results for input(s): TSH, T4TOTAL, FREET4, T3FREE, THYROIDAB in the last 72 hours. Anemia Panel: No results for input(s): VITAMINB12, FOLATE, FERRITIN, TIBC, IRON, RETICCTPCT in the last 72 hours. Sepsis Labs: No results for input(s): PROCALCITON, LATICACIDVEN in the last 168 hours.  Recent Results (from the past 240 hour(s))  Resp Panel by RT-PCR (Flu A&B, Covid) Nasopharyngeal Swab     Status: None   Collection Time: 07/01/20  4:45 PM   Specimen: Nasopharyngeal Swab; Nasopharyngeal(NP) swabs in vial transport medium  Result Value Ref Range Status   SARS Coronavirus 2 by RT PCR NEGATIVE NEGATIVE Final    Comment: (NOTE) SARS-CoV-2 target nucleic acids are NOT DETECTED.  The SARS-CoV-2 RNA is generally detectable in upper respiratory specimens during the acute phase of infection. The lowest concentration of SARS-CoV-2 viral copies this assay can detect is 138 copies/mL. A negative result does not preclude SARS-Cov-2 infection and should not be used as the sole basis for treatment or other patient management decisions. A negative result may occur with  improper specimen collection/handling, submission of specimen other than nasopharyngeal swab, presence of viral mutation(s) within the areas targeted by this assay, and inadequate number of viral copies(<138 copies/mL). A negative result must be combined with clinical observations, patient history, and epidemiological information. The expected result is Negative.  Fact Sheet for Patients:  EntrepreneurPulse.com.au  Fact Sheet for Healthcare Providers:   IncredibleEmployment.be  This test is no t yet approved or cleared by the [place] FDA and  has been authorized for detection and/or diagnosis of SARS-CoV-2 by FDA under an Emergency Use Authorization (EUA). This EUA will remain  in effect (meaning this test can be used) for the duration of the COVID-19 declaration under Section 564(b)(1) of the Act, 21 U.S.C.section 360bbb-3(b)(1), unless the authorization is terminated  or revoked sooner.       Influenza A by PCR NEGATIVE NEGATIVE Final   Influenza B by PCR NEGATIVE NEGATIVE Final    Comment: (NOTE) The Xpert Xpress SARS-CoV-2/FLU/RSV plus assay is intended as an aid in the diagnosis of influenza from Nasopharyngeal swab specimens and should not be used as a sole basis for treatment. Nasal washings and aspirates are unacceptable for Xpert Xpress SARS-CoV-2/FLU/RSV testing.  Fact Sheet for Patients: EntrepreneurPulse.com.au  Fact Sheet for Healthcare Providers: IncredibleEmployment.be  This test is not yet approved or cleared by the [place] FDA and has been authorized for detection and/or diagnosis of SARS-CoV-2 by FDA  under an Emergency Use Authorization (EUA). This EUA will remain in effect (meaning this test can be used) for the duration of the COVID-19 declaration under Section 564(b)(1) of the Act, 21 U.S.C. section 360bbb-3(b)(1), unless the authorization is terminated or revoked.  Performed at [facility], [address] [place], [place] [address]   Culture, blood (Routine X 2) w Reflex to ID Panel     Status: None   Collection Time: 07/02/20  4:24 PM   Specimen: BLOOD  Result Value Ref Range Status   Specimen Description BLOOD RIGHT ANTECUBITAL  Final   Special Requests   Final    BOTTLES DRAWN AEROBIC AND ANAEROBIC Blood Culture adequate volume   Culture   Final    NO GROWTH 5 DAYS Performed at [facility], [address] [place], [place] [address]    Report Status 07/07/2020 FINAL  Final  Culture, blood (Routine X 2) w Reflex to ID Panel     Status: None   Collection Time: 07/02/20  4:24 PM   Specimen: BLOOD  Result Value Ref Range Status   Specimen Description BLOOD LEFT ANTECUBITAL  Final   Special Requests   Final    BOTTLES DRAWN AEROBIC AND ANAEROBIC Blood Culture adequate volume   Culture   Final    NO GROWTH 5 DAYS Performed at [facility], [address] [place], [place] [address]    Report Status 07/07/2020 FINAL  Final  Fungus Culture With Stain     Status: None (Preliminary result)   Collection Time: 07/03/20 10:16 AM   Specimen: PATH Cytology Misc. fluid; Synovial Fluid  Result Value Ref Range Status   Fungus Stain Final report  Final    Comment: (NOTE) Performed At: [facility] [place], [place] [id number] Rush Farmer MD XN:[id number]    Fungus (Mycology) Culture PENDING  Incomplete   Fungal Source SYNOVIAL  Final    Comment: Performed at [facility], [address] [place], [place] [address]  Body fluid culture w Gram Stain     Status: None   Collection Time: 07/03/20 10:16 AM   Specimen: PATH Cytology Misc. fluid; Synovial Fluid  Result Value Ref Range Status   Specimen Description   Final    SYNOVIAL Performed at [facility], [address] [place], [place] [address]    Special Requests   Final    SYNOVIAL Performed at [facility], [place], [place] [address]    Gram Stain NO WBC SEEN NO ORGANISMS SEEN   Final   Culture   Final    NO GROWTH 3 DAYS Performed at [facility], [place] [address] [place], [place] [address]    Report Status 07/07/2020 FINAL  Final  Fungus Culture Result     Status: None   Collection Time: 07/03/20 10:16 AM  Result Value Ref Range Status   Result 1 Comment  Final    Comment: (NOTE) KOH/Calcofluor preparation:  no fungus observed. Performed At:  [facility] [place], [place] [id number] Rush Farmer MD PQ:[id number]          Radiology Studies: No results found.      Scheduled Meds: . acetaminophen  650 mg Oral TID AC & HS  . brimonidine  1 drop Both Eyes Q12H   And  . timolol  1 drop Both Eyes Q12H  . enoxaparin (LOVENOX) injection  1 mg/kg Subcutaneous Q12H  . feeding supplement (  NEPRO CARB STEADY)  237 mL Oral TID BM  . FLUoxetine  40 mg Oral Daily  . fluticasone furoate-vilanterol  1 puff Inhalation Daily  . lidocaine  1 patch Transdermal Q24H  . methocarbamol  750 mg Oral TID  . montelukast  10 mg Oral Daily  . moxifloxacin  1 drop Both Eyes TID  . multivitamin with minerals  1 tablet Oral Daily  . nepafenac  1 drop Both Eyes Daily  . Netarsudil-Latanoprost  1 drop Both Eyes QPM  . pantoprazole  20 mg Oral Daily  . polyethylene glycol  17 g Oral Daily  . senna-docusate  2 tablet Oral BID  . sotalol  80 mg Oral Daily   Continuous Infusions:   LOS: 8 days    Time spent: 25 minutes    Sidney Ace, MD [facility] Pager [phone]  If 7PM-7AM, please contact night-coverage 07/09/2020, 1:53 PM

## 2020-07-09 NOTE — Progress Notes (Signed)
Nutrition Follow-up  DOCUMENTATION CODES:   Non-severe (moderate) malnutrition in context of chronic illness  INTERVENTION:  Recommend liberalizing diet to regular.  Will discontinue Ensure Enlive per patient request as she would like to try a butter pecan supplement.  Provide Nepro Shake po TID, each supplement provides 425 kcal and 19 grams protein. Patient prefers butter pecan.  Provide Magic cup TID with meals, each supplement provides 290 kcal and 9 grams of protein. Patient prefers orange.  Continue MVI po daily.  NUTRITION DIAGNOSIS:   Moderate Malnutrition related to chronic illness (CHF) as evidenced by mild fat depletion,mild muscle depletion,moderate muscle depletion.  Ongoing.  GOAL:   Patient will meet greater than or equal to 90% of their needs  Progressing.  MONITOR:   PO intake,Supplement acceptance,Labs,Weight trends,I & O's  REASON FOR ASSESSMENT:   Malnutrition Screening Tool    ASSESSMENT:   87 year old female with PMHx of asthma, PE, DVT, depression, CHF, arthritis, CVA, HTN, hx right hip fracture in 2013 s/p total right hip arthroplasty now admitted with acute right hip pain.  2/22 s/p biopsy of right thigh mass  Met with patient and her daughter at bedside. Patient reports her appetite is about the same as it typically is at home. Patient documented to be eating 85-100% of meals but per daughter patient is only eating about 50% of her meals. Patient is trying to drink the Ensure [facility] but she does not like them so she is unable to drink them regularly. Patient would like to try a butter pecan ONS and would like to try Nepro. She is also amenable to trying Magic Cup orange flavor. Daughter asking about why patient was put on heart healthy diet as last week patient was on regular. RD agrees patient would benefit from liberalized diet. Messaged MD via secure chat and MD is okay with liberalizing diet to regular.  Medications reviewed and include: MVI  daily, Protonix, Miralax 17 grams daily, senna-docusate 2 tablets BID.  Labs reviewed: Sodium 133, BUN 27.  I/O: 450 mL UOP yesterday + 1 occurrences unmeasured UOP yesterday  Weight trend: 69.9 kg on 2/24; +3.5 kg from 2/16  Diet Order:   Diet Order            Diet Heart Room service appropriate? Yes; Fluid consistency: Thin  Diet effective now                EDUCATION NEEDS:   No education needs have been identified at this time  Skin:  Skin Assessment: Skin Integrity Issues: Skin Integrity Issues:: Incisions Incisions: closed incision to thigh  Last BM:  07/09/2020 - medium type 4  Height:   Ht Readings from Last 1 Encounters:  07/01/20 '5\' 4"'  (1.626 m)   Weight:   Wt Readings from Last 1 Encounters:  07/09/20 69.9 kg   BMI:  Body mass index is 26.45 kg/m.  Estimated Nutritional Needs:   Kcal:  1700-1900  Protein:  85-95 grams  Fluid:  1.7 L/day  Jacklynn Barnacle, MS, RD, LDN Pager number available on Amion

## 2020-07-09 NOTE — Progress Notes (Signed)
Physical Therapy Treatment Patient Details Name: Michele Summers MRN: [id number] DOB: October 18, 1931 Today's Date: 07/09/2020    History of Present Illness Pt is an 87 yo female arrived at the ED with R hip pain and difficulty walking.  Per pt and daughter present at bedside the pt had imaging done at [facility] over a weak ago with no signs of fracture.  Per patient's daughter they were told the pain was likely from bursitis.  PMH includes R THA 2013, CHF, PE, and CVA.    PT Comments    Pt is making limited progress towards goals, still limited by intense pain with any movement on R hip. Able to participate in limited supine there-ex, however then needs to have BM. Encouraged to attempt transfer to [facility], however pt rather use bedpan. Assisted for bed mobility and positioning. Further mobility deferred at this time.   Follow Up Recommendations  SNF;Supervision for mobility/OOB     Equipment Recommendations  Rolling walker with 5" wheels;3in1 (PT)    Recommendations for Other Services       Precautions / Restrictions Precautions Precautions: Fall Restrictions Weight Bearing Restrictions: Yes RLE Weight Bearing: Touchdown weight bearing    Mobility  Bed Mobility Overal bed mobility: Needs Assistance Bed Mobility: Rolling;Supine to Sit Rolling: Max assist         General bed mobility comments: needs assist for rolling to R side for bed pan placement thus limiting further mobility.    Transfers                 General transfer comment: not able to tolerate at this time  Ambulation/Gait                 Stairs             Wheelchair Mobility    Modified Rankin (Stroke Patients Only)       Balance                                            Cognition Arousal/Alertness: Awake/alert Behavior During Therapy: WFL for tasks assessed/performed Overall Cognitive Status: Within Functional Limits for tasks assessed                                         Exercises Other Exercises Other Exercises: Supine ther-ex performed on B LE including AP, quad sets, SLRs, and hip abd/add. Very minimal movement noted in R LE requiring max assist. Limited by pain. Other Exercises: Performed rolling to side for bed pan placement. Further mobility deferred and gave call bell to reach out when finished    General Comments        Pertinent Vitals/Pain Pain Assessment: 0-10 Pain Score: 8  Pain Location: R hip Pain Descriptors / Indicators: Sore;Aching Pain Intervention(s): Limited activity within patient's tolerance;Repositioned    Home Living                      Prior Function            PT Goals (current goals can now be found in the care plan section) Acute Rehab PT Goals Patient Stated Goal: To walk without pain PT Goal Formulation: With patient Time For Goal Achievement: 07/14/20 Potential to Achieve Goals: Good Progress towards  PT goals: Progressing toward goals    Frequency    Min 2X/week      PT Plan Current plan remains appropriate    Co-evaluation              AM-PAC PT "6 Clicks" Mobility   Outcome Measure  Help needed turning from your back to your side while in a flat bed without using bedrails?: A Lot Help needed moving from lying on your back to sitting on the side of a flat bed without using bedrails?: A Lot Help needed moving to and from a bed to a chair (including a wheelchair)?: Total Help needed standing up from a chair using your arms (e.g., wheelchair or bedside chair)?: Total Help needed to walk in hospital room?: Total Help needed climbing 3-5 steps with a railing? : Total 6 Click Score: 8    End of Session   Activity Tolerance: Patient limited by pain Patient left: in bed;with call bell/phone within reach;with bed alarm set Nurse Communication: Mobility status;Weight bearing status PT Visit Diagnosis: Difficulty in walking, not elsewhere classified  (R26.2);Muscle weakness (generalized) (M62.81);Pain Pain - Right/Left: Right Pain - part of body: Hip     Time: 9758-8325 PT Time Calculation (min) (ACUTE ONLY): 13 min  Charges:  $Therapeutic Exercise: 8-22 mins                    Greggory Stallion, PT, DPT [phone]    Aras Albarran 07/09/2020, 12:49 PM

## 2020-07-09 NOTE — Plan of Care (Signed)
  Problem: Education: Goal: Knowledge of General Education information will improve Description: Including pain rating scale, medication(s)/side effects and non-pharmacologic comfort measures Outcome: Progressing   Problem: Clinical Measurements: Goal: Ability to maintain clinical measurements within normal limits will improve Outcome: Progressing Goal: Will remain free from infection Outcome: Progressing Goal: Diagnostic test results will improve Outcome: Progressing   Problem: Activity: Goal: Risk for activity intolerance will decrease Outcome: Progressing   Problem: Pain Managment: Goal: General experience of comfort will improve Outcome: Progressing   Problem: Safety: Goal: Ability to remain free from injury will improve Outcome: Progressing   Problem: Skin Integrity: Goal: Risk for impaired skin integrity will decrease Outcome: Progressing   

## 2020-07-09 NOTE — Progress Notes (Signed)
Date of Admission:  07/01/2020     ID: LEEONA MCCARDLE is a 87 y.o. female  Principal Problem:   Acute right hip pain Active Problems:   Moderate persistent asthma   Essential hypertension   GAD (generalized anxiety disorder)   Paroxysmal atrial fibrillation (HCC)   Chronic diastolic CHF (congestive heart failure) (HCC)   Malnutrition of moderate degree    Subjective: Pt in her room on her own Daughter not there Pt says she got pain meds and the pain is better  Medications:  . acetaminophen  650 mg Oral TID AC & HS  . brimonidine  1 drop Both Eyes Q12H   And  . timolol  1 drop Both Eyes Q12H  . enoxaparin (LOVENOX) injection  1 mg/kg Subcutaneous Q12H  . feeding supplement (NEPRO CARB STEADY)  237 mL Oral TID BM  . FLUoxetine  40 mg Oral Daily  . fluticasone furoate-vilanterol  1 puff Inhalation Daily  . lidocaine  1 patch Transdermal Q24H  . methocarbamol  750 mg Oral TID  . montelukast  10 mg Oral Daily  . moxifloxacin  1 drop Both Eyes TID  . multivitamin with minerals  1 tablet Oral Daily  . nepafenac  1 drop Both Eyes Daily  . Netarsudil-Latanoprost  1 drop Both Eyes QPM  . pantoprazole  20 mg Oral Daily  . polyethylene glycol  17 g Oral Daily  . senna-docusate  2 tablet Oral BID  . sotalol  80 mg Oral Daily    Objective: Vital signs in last 24 hours: Temp:  [98.2 F (36.8 C)-99.1 F (37.3 C)] 98.2 F (36.8 C) (02/24 1144) Pulse Rate:  [63-79] 66 (02/24 1144) Resp:  [16-18] 18 (02/24 1144) BP: (103-152)/(41-122) 103/42 (02/24 1144) SpO2:  [93 %-97 %] 96 % (02/24 1144) Weight:  [69.9 kg] 69.9 kg (02/24 0500)  PHYSICAL EXAM:  General: Alert, cooperative, no distress, appears stated age.  Lungs: Clear to auscultation bilaterally. No Wheezing or Rhonchi. No rales. Heart: Regular rate and rhythm, no murmur, rub or gallop. Pacemaker site okay Abdomen: Soft, non-tender,not distended. Bowel sounds normal. No masses Extremities: rt upper thigh swelling Skin:  No rashes or lesions. Or bruising Lymph: Cervical, supraclavicular normal. Neurologic: Grossly non-focal  Lab Results Recent Labs    07/08/20 0933 07/09/20 0448  WBC 10.8* 9.2  HGB 9.4* 8.8*  HCT 28.2* 27.5*  NA 132* 133*  K 4.7 4.5  CL 98 99  CO2 27 28  BUN 29* 27*  CREATININE 0.78 0.73   ESR 40  Microbiology: BC neg Rt hip aspirate neg   Pathology THIGH MASS, RIGHT UPPER;   - MYOFIBROBLASTIC PROLIFERATION WITH FINDINGS CONSISTENT WITH  PROLIFERATIVE FASCIITIS / MYOSITIS.  The cores contain plump spindle cells with admixed ganglion-like cells  that focally infiltrate skeletal muscle. The background stroma is  predominantly myxoid with other more collagenous and fibrous areas.  Areas of necrosis and admixed acute inflammation are present. Scattered  mitotic figures are noted though significant atypia is not appreciated   Assessment/Plan: Right thigh mass with severe pain.  Patient has right hemiarthroplasty.  Initial x-ray had some lucency with cortical destruction of the proximal femur around the hip replacement stem concerning for infection/osteomyelitis.  The right hip aspiration was a dry tap.  An ultrasound done on 07/04/2020 showed a fluid collection of 30 cm.  On 07/07/2020 she underwent aspiration but no fluid was obtained and so core biopsy was taken.  No cultures were sent.  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  Gram stain and AFB stain were negative.  No infection This is an unusual condition and as per literature could present after  trauma , can grow fast, sometimes resolves without any treatment.  Surgery is needed most of the time.  Hospitalist talked to the family.  Plan is to get her to SNF after pain control.  And then on discharge she would follow-up with Ortho at [facility] or [facility].  No evidence of infection  Proximal A. fib on sotalol  Cardiac pacemaker  Anemia  Hypertension  Discussed with the care team ID will sign off call if  needed.   

## 2020-07-10 DIAGNOSIS — M25551 Pain in right hip: Secondary | ICD-10-CM | POA: Diagnosis not present

## 2020-07-10 LAB — BASIC METABOLIC PANEL
Anion gap: 5 (ref 5–15)
BUN: 29 mg/dL — ABNORMAL HIGH (ref 8–23)
CO2: 28 mmol/L (ref 22–32)
Calcium: 8.7 mg/dL — ABNORMAL LOW (ref 8.9–10.3)
Chloride: 102 mmol/L (ref 98–111)
Creatinine, Ser: 0.73 mg/dL (ref 0.44–1.00)
GFR, Estimated: >60 mL/min (ref >60)
Glucose, Bld: 125 mg/dL — ABNORMAL HIGH (ref 70–99)
Potassium: 4.3 mmol/L (ref 3.5–5.1)
Sodium: 135 mmol/L (ref 135–145)

## 2020-07-10 LAB — CBC WITH DIFFERENTIAL/PLATELET
Abs Immature Granulocytes: 0.12 10*3/uL — ABNORMAL HIGH (ref 0.00–0.07)
Basophils Absolute: 0.1 10*3/uL (ref 0.0–0.1)
Basophils Relative: 1 %
Eosinophils Absolute: 0.4 10*3/uL (ref 0.0–0.5)
Eosinophils Relative: 5 %
HCT: 25.9 % — ABNORMAL LOW (ref 36.0–46.0)
Hemoglobin: 8.6 g/dL — ABNORMAL LOW (ref 12.0–15.0)
Immature Granulocytes: 1 %
Lymphocytes Relative: 13 %
Lymphs Abs: 1.2 10*3/uL (ref 0.7–4.0)
MCH: 26.9 pg (ref 26.0–34.0)
MCHC: 33.2 g/dL (ref 30.0–36.0)
MCV: 80.9 fL (ref 80.0–100.0)
Monocytes Absolute: 0.8 10*3/uL (ref 0.1–1.0)
Monocytes Relative: 9 %
Neutro Abs: 6.8 10*3/uL (ref 1.7–7.7)
Neutrophils Relative %: 71 %
Platelets: 478 10*3/uL — ABNORMAL HIGH (ref 150–400)
RBC: 3.2 MIL/uL — ABNORMAL LOW (ref 3.87–5.11)
RDW: 13.5 % (ref 11.5–15.5)
WBC: 9.4 10*3/uL (ref 4.0–10.5)
nRBC: 0 % (ref 0.0–0.2)

## 2020-07-10 LAB — SARS CORONAVIRUS 2 (TAT 6-24 HRS): SARS Coronavirus 2: NEGATIVE

## 2020-07-10 MED ORDER — METHOCARBAMOL 750 MG PO TABS: 750.0000 mg | ORAL_TABLET | Freq: Three times a day (TID) | ORAL | Status: AC

## 2020-07-10 MED ORDER — APIXABAN 5 MG PO TABS: 5.0000 mg | ORAL_TABLET | Freq: Two times a day (BID) | ORAL | Status: DC

## 2020-07-10 MED ORDER — APIXABAN 5 MG PO TABS
10.0000 mg | ORAL_TABLET | Freq: Two times a day (BID) | ORAL | Status: DC
  Administered 2020-07-10 – 2020-07-11 (×2): 10 mg via ORAL
  Filled 2020-07-10 (×2): qty 2

## 2020-07-10 MED ORDER — SENNOSIDES-DOCUSATE SODIUM 8.6-50 MG PO TABS: 2.0000 | ORAL_TABLET | Freq: Two times a day (BID) | ORAL | Status: AC

## 2020-07-10 MED ORDER — APIXABAN (ELIQUIS) VTE STARTER PACK (10MG AND 5MG): ORAL_TABLET | ORAL | 0 refills | Status: AC

## 2020-07-10 MED ORDER — OXYCODONE HCL 5 MG PO TABS: 5.0000 mg | ORAL_TABLET | ORAL | 0 refills | Status: AC | PRN

## 2020-07-10 MED ORDER — POLYETHYLENE GLYCOL 3350 17 G PO PACK: 17.0000 g | PACK | Freq: Every day | ORAL | 0 refills | Status: AC

## 2020-07-10 MED ORDER — LIDOCAINE 5 % EX PTCH: 1.0000 | MEDICATED_PATCH | CUTANEOUS | 0 refills | Status: AC

## 2020-07-10 NOTE — Discharge Summary (Addendum)
Physician Discharge Summary  ELLIANNA RUEST IHK:[id number] DOB: 02/03/1932 DOA: 07/01/2020  PCP: [name], MD  Admit date: 07/01/2020 Discharge date: 07/10/2020  Admitted From: Home  Disposition:  SNF  Recommendations for Outpatient Follow-up:  1. Follow up with PCP in 1-2 weeks 2. Follow up with orthopedic oncology at [facility] or [facility]:No Equipment/Devices:None Discharge Condition:Stable CODE STATUS:FULL Diet recommendation: Regular  Brief/Interim Summary: Hodaya Curto Reeseis a 87 y.o.femalewith medical history significant forright hip fracture in 2013 status post right total hip arthroplasty,paroxysmal atrial fibrillation complicated by sick sinus syndrome status post pacemaker placement, hypertension, chronic diastolic heart failure, generalized anxiety disorder, moderatepersistentasthma, who [facility]with acute right hip painafter [facility] complaining of [facility].  She confirms a history of right hip fracture back in 2013, at which time she underwent total right hip arthroplasty without anyreportedensuing complications from this procedure 2/17-Per Dr. [name] via messaging, need to have IR aspirate rt hip for gram stain and culture r/o infection as xray suggestive. 2/18-plan for aspiration today by IR.  2/19-spoke to Dr. [name] radiology he reported we should obtain US of anterior hip to r/o liquid or mass, if this was negative then consider MR for further evalatuion 2/20-rt hip anterior [place] completed. Send message to oncall ortho for review, if we need MR for further evaluation.  2/21-pain this am.spoke to ID and ortho, and radiology, plan to do another aspirate +/- hip bx in am as she got lovenox today 2/22: Status post IR guided biopsy of right thigh 2/23-communicated with IR and ID via secure chat.  Per IR thigh lesion was more solid and vascular then ultrasound had suggested.  Biopsies were taken and sent however no culture  was sent.  Patient evaluated bedside.  Still with significant pain in the right thigh. 2/24: Pain control seems to be slowly improving. Patient had some issues with delirium and mental status changes. Appears to back to baseline this morning. Communicated with pathology. 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 2/25: Patient to be discharged to [facility] when pain is controlled 2/26: Pain control optimized.  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 after core biopsy Per literature review this is a benign rapidly growing soft tissue mass Surgical resection does appear to be an option however per literature review this also could be self-limited over the course of a couple months I have communicated with patient and family regarding this diagnosis Plan: Pain control Once pain control is optimized plan on discharge to skilled nursing facility I have provided names of 2 orthopedic oncologist to patient's family wanted [facility] and 1 at [facility] Pain control optimized, can dc to SNF  Acute right hip and thigh pain Patient was apparently ambulatory 3 weeks prior Hip has caused decreased level of mobility in substantial pain Per Ortho no evidence of fracture and patient is a poor surgical candidate ID was consulted given concern for possible infectious etiology Patient without leukocytosis, afebrile, normal inflammatory markers Patient underwent IR guided biopsy of the right hip Biopsy positive for proliferative myositis/fasciitis Infection or malignancy appears to be off differential at this time Plan: Multimodal  pain control  Therapy evaluations Discharge to SNF once pain control optimized  Nonocclusive DVT central right femoral vein Patient on Lovenox Defer hematology consult for now, will reconsult as necessary.   Discussed with Dr. [name] [facility] while in house Transition back to Eliquis on discharge Plan: Continue Eliquis Re-start at 10 BID x 7 days, followed by 5 BID  Chronic diastolic congestive heart failure Not exacerbated Monitor clinically I's and O's Daily weights  Paroxysmal atrial fibrillation Currently rate controlled Continue eliquis  Essential hypertension stable Continue amlodipine 5 mg daily  Generalized anxiety disorder Continue Prozac  Xanax as needed  Moderate persistent asthma Outpatient respiratory regimen consists of scheduled Advair, Singulair. No clinical evidence to suggest acute exacerbation.  GERD PPI  Discharge Instructions   Allergies as of 07/10/2020      Reactions   Aminoglycosides    Bee Pollen    Sulfa Antibiotics Itching   Tramadol    Makes her feel "crazy"   Morphine And Related Other (See Comments)   unknown      Medication List    STOP taking these medications   apixaban 2.5 MG Tabs tablet Commonly known as: ELIQUIS Replaced by: Apixaban Starter Pack (10mg  and 5mg )   oxyCODONE-acetaminophen 5-325 MG tablet Commonly known as: Percocet     TAKE these medications   acetaminophen-codeine 300-30 MG tablet Commonly known as: TYLENOL #3 Take 1 tablet by mouth every 8 (eight) hours.   albuterol 108 (90 Base) MCG/ACT inhaler Commonly known as: VENTOLIN HFA INHALE 2 PUFFS INTO LUNGS EVERY 6 HOURS AS NEEDED FOR SHORTNESS OF BREATH What changed: See the new instructions.   albuterol-ipratropium 18-103 MCG/ACT inhaler Commonly known as: COMBIVENT Inhale into the lungs every 4 (four) hours.   ALPRAZolam 0.25 MG tablet Commonly known as: XANAX Take one tab a day prn for anxiety and panic attacks   amLODipine 5  MG tablet Commonly known as: NORVASC TAKE ONE TABLET BY MOUTH AT BEDTIME FOR HTN What changed:   how much to take  how to take this  when to take this   Apixaban Starter Pack (10mg  and 5mg ) Commonly known as: ELIQUIS STARTER PACK Take as directed on package: start with two-5mg  tablets twice daily for 7 days. On day 8, switch to one-5mg  tablet twice daily. Replaces: apixaban 2.5 MG Tabs tablet   brimonidine-timolol 0.2-0.5 % ophthalmic solution Commonly known as: COMBIGAN Place 1 drop into both eyes every 12 (twelve) hours.   conjugated estrogens vaginal cream Commonly known as: PREMARIN Place 1 Applicatorful vaginally 2 (two) times a week.   erythromycin ophthalmic ointment Apply to sutures 4 times a day for 10-12 days.  Discontinue if allergy develops and call our office   FLUoxetine 40 MG capsule Commonly known as: PROZAC Take 1 capsule (40 mg total) by mouth daily. AM   Fluticasone-Salmeterol 250-50 MCG/DOSE Aepb Commonly known as: Advair Diskus Inhale 1 puff into the lungs 2 (two) times daily.   hydrALAZINE 25 MG tablet Commonly known as: APRESOLINE Take 1 tablet (25 mg total) by mouth daily.   KRILL OIL PO Take by mouth.   lidocaine 5 % Commonly known as: LIDODERM Place 1 patch onto the skin daily. Remove & Discard patch within 12 hours or as directed by MD   losartan 50 MG tablet Commonly known as: COZAAR Take 1 tablet (50 mg total) by mouth daily.   methocarbamol 750 MG tablet Commonly known as: ROBAXIN Take 1 tablet (750 mg total) by mouth 3 (three) times daily.   montelukast  10 MG tablet Commonly known as: SINGULAIR Take 1 tablet (10 mg total) by mouth daily.   moxifloxacin 0.5 % ophthalmic solution Commonly known as: VIGAMOX 1 drop 3 (three) times daily.   nepafenac 0.3 % ophthalmic suspension Commonly known as: ILEVRO 1 drop daily.   oxyCODONE 5 MG immediate release tablet Commonly known as: Oxy IR/ROXICODONE Take 1-2 tablets (5-10 mg  total) by mouth every 4 (four) hours as needed for up to 3 days for moderate pain or severe pain. SNF use only.   polyethylene glycol 17 g packet Commonly known as: MIRALAX / GLYCOLAX Take 17 g by mouth daily.   PRESERVISION AREDS PO Take by mouth. AM   Rocklatan 0.02-0.005 % Soln Generic drug: Netarsudil-Latanoprost Place 1 drop into both eyes every evening.   senna-docusate 8.6-50 MG tablet Commonly known as: Senokot-S Take 2 tablets by mouth 2 (two) times daily.   sotalol 80 MG tablet Commonly known as: BETAPACE Take 1 tablet (80 mg total) by mouth daily.   travoprost (benzalkonium) 0.004 % ophthalmic solution Commonly known as: TRAVATAN 1 drop at bedtime.   vitamin B-12 500 MCG tablet Commonly known as: CYANOCOBALAMIN Take 500 mcg by mouth daily.   zolpidem 5 MG tablet Commonly known as: AMBIEN Take one tab po qhs for sleep       Contact information for after-discharge care    [facility] .   Service: Skilled Nursing Contact information: [place] [phone]                 Allergies  Allergen Reactions  . Aminoglycosides   . Bee Pollen   . Sulfa Antibiotics Itching  . Tramadol     Makes her feel "crazy"  . Morphine And Related Other (See Comments)    unknown    Consultations:  ID  Orthopedic surgery     Procedures/Studies: CT Hip Right Wo Contrast  Result Date: 07/01/2020 CLINICAL DATA:  Worsening right hip pain over the past 2-3 weeks. History of prior right hip arthroplasty. No known injury. EXAM: CT OF THE RIGHT HIP WITHOUT CONTRAST TECHNIQUE: Multidetector CT imaging of the right hip was performed according to the standard protocol. Multiplanar CT image reconstructions were also generated. COMPARISON:  Plain films right femur 11/02/2014. FINDINGS: Bones/Joint/Cartilage Right total hip arthroplasty is  in place and results in streak artifact on the study which limits evaluation, particularly of the proximal femur. The device is located. No evidence of loosening or other hardware complication is identified. No focal bone lesion is seen. Ligaments Suboptimally assessed by CT. Muscles and Tendons Intact and normal in appearance. Soft tissues Atherosclerotic vascular disease noted. The patient is status post hysterectomy. IMPRESSION: No acute abnormality. Right hip arthroplasty results in fairly extensive streak artifact on the examination, particularly about the proximal femur. Recommend correlation with plain films of the hip if there is concern for a periprosthetic proximal femur fracture. Electronically Signed   By: Inge Rise M.D.   On: 07/01/2020 13:23   US Venous Img Lower Unilateral Right (DVT)  Result Date: 07/07/2020 CLINICAL DATA:  87 year old female with right lower extremity swelling. EXAM: RIGHT LOWER EXTREMITY VENOUS DOPPLER ULTRASOUND TECHNIQUE: Gray-scale sonography with graded compression, as well as color Doppler and duplex ultrasound were performed to evaluate the right lower extremity deep venous systems from the level of the common femoral vein and including the common femoral, femoral, profunda  femoral, popliteal and calf veins including the posterior tibial, peroneal and gastrocnemius veins when visible. Spectral Doppler was utilized to evaluate flow at rest and with distal augmentation maneuvers in the common femoral, femoral and popliteal veins. The contralateral common femoral vein was also evaluated for comparison. COMPARISON:  None. FINDINGS: RIGHT LOWER EXTREMITY Common Femoral Vein: No evidence of thrombus. Normal compressibility, respiratory phasicity and response to augmentation. Central Greater Saphenous Vein: No evidence of thrombus. Normal compressibility and flow on color Doppler imaging. Central Profunda Femoral Vein: No evidence of thrombus. Normal compressibility and  flow on color Doppler imaging. Femoral Vein: Short-segment, nonocclusive focal thrombus in the central femoral vein. The femoral vein is patent in the mid and peripheral segments. Popliteal Vein: No evidence of thrombus. Normal compressibility, respiratory phasicity and response to augmentation. Calf Veins: No evidence of thrombus. Normal compressibility and flow on color Doppler imaging. Other Findings:  None. LEFT LOWER EXTREMITY Common Femoral Vein: No evidence of thrombus. Normal compressibility, respiratory phasicity and response to augmentation. IMPRESSION: Subacute appearing, non-occlusive focal thrombus limited to the central right femoral vein. Ruthann Cancer, MD [facility] Electronically Signed   By: Ruthann Cancer MD   On: 07/07/2020 11:40   DG FLUORO GUIDED NEEDLE PLC ASPIRATION/INJECTION LOC  Addendum Date: 07/07/2020   ADDENDUM REPORT: 07/07/2020 12:28 ADDENDUM: During preprocedural assessment in area along the anterior compartment suggested there may be some fluid. The attempted aspiration not yielding fluid was discordant with the imaging findings. Further assessment with ultrasound or MRI to exclude mass or fluid collection in the anterior compartment of the thigh anterior to the femur was suggested as outlined in the information below. See subsequent imaging for further detail. These results were called by telephone at the time of interpretation on 07/04/2020 at 1045 AM to provider [facility] , who verbally acknowledged these results. Electronically Signed   By: Zetta Bills M.D.   On: 07/07/2020 12:28   Result Date: 07/07/2020 CLINICAL DATA:  History of prosthetic hip with hip pain. EXAM: RIGHT HIP ASPIRATION UNDER FLUOROSCOPY COMPARISON:  CT OF THE SAME DATE. FLUOROSCOPY TIME:  Fluoroscopy Time:  30 SECONDS Radiation Exposure Index (if provided by the fluoroscopic device): 5.1 mGy Number of Acquired Spot Images: 0 PROCEDURE: Consent  was obtained, time-out was performed to verify site and side of procedure. The patient's RIGHT anterior thigh was prepped and draped in the usual sterile fashion, numbing with 15 cc of lidocaine was performed. First with a 20 gauge needle and subsequently with an 18 gauge needle attempts to aspirate this area were made without success. A small amount of saline was flushed through the needle and additional attempts were made to aspirate yielding a scant amount of material and a small amount of blood. Sample was sent to the lab for requested testing. IMPRESSION: Attempted aspiration of the fluid density area along the anterior margin of the RIGHT proximal femur, associated with bony resorption. This yielded scant material, material sent for requested testing. Electronically Signed: By: Zetta Bills M.D. On: 07/03/2020 10:37   [place] RT LOWER EXTREM LTD SOFT TISSUE NON VASCULAR  Result Date: 07/04/2020 CLINICAL DATA:  Right hip pain for 2 weeks. Question mass or fluid collection. EXAM: ULTRASOUND RIGHT LOWER EXTREMITY LIMITED TECHNIQUE: Ultrasound examination of the lower extremity soft tissues was performed in the area of clinical concern. COMPARISON:  CT right hip 07/02/2019. FINDINGS: Scanning was directed toward the region of concern anterior to the right hip. There is a  debris containing fluid collection measuring 12.7 x 5.5 x 5.9 cm. There is no flow within or about the collection on Doppler imaging. No other abnormality is identified. IMPRESSION: Debris containing fluid collection anterior to the proximal right femur may be due to a pseudotumor related to the patient's prosthesis or old hematoma. Abscess is possible but less likely given absence of surrounding blood flow. The collection should be amenable to aspiration under ultrasound. Electronically Signed   By: Inge Rise M.D.   On: 07/04/2020 11:54   [place] CORE BIOPSY (SOFT TISSUE)  Result Date: 07/07/2020 INDICATION: 87 year old woman with right  thigh mass/fluid collection presents to interventional radiology for ultrasound-guided aspiration/biopsy EXAM: Ultrasound-guided aspiration and biopsy of right upper thigh mass/fluid collection MEDICATIONS: None. ANESTHESIA/SEDATION: Moderate (conscious) sedation was employed during this procedure. A total of Versed 0 mg and Fentanyl 25 mcg was administered intravenously. Moderate Sedation Time: 10 minutes. The patient's level of consciousness and vital signs were monitored continuously by radiology nursing throughout the procedure under my direct supervision. COMPLICATIONS: None immediate. PROCEDURE: Informed written consent was obtained from the patient after a thorough discussion of the procedural risks, benefits and alternatives. All questions were addressed. Maximal Sterile Barrier Technique was utilized including caps, mask, sterile gowns, sterile gloves, sterile drape, hand hygiene and skin antiseptic. A timeout was performed prior to the initiation of the procedure. Patient position supine on the ultrasound table. Right upper thigh skin prepped and draped in usual sterile fashion. Ultrasound evaluation of the right upper thigh again demonstrated the hypoechoic collection/mass. Following local lidocaine administration, 8 [place] Yueh needle was advanced into the lesion, however no fluid could be aspirated. 17 gauge introducer needle was then advanced into the right upper thigh lesion, and four 18 gauge cores were obtained utilizing continuous ultrasound guidance. Samples were sent to pathology in formalin. Needle removed and hemostasis achieved with 2 minutes of manual compression. Post procedure ultrasound images showed no evidence of significant hemorrhage. IMPRESSION: Ultrasound-guided biopsy of right thigh mass Electronically Signed   By: Miachel Roux M.D.   On: 07/07/2020 16:03   DG HIP UNILAT WITH PELVIS 2-3 VIEWS RIGHT  Result Date: 07/02/2020 CLINICAL DATA:  Acute right hip pain EXAM: DG HIP (WITH  OR WITHOUT PELVIS) 2-3V RIGHT COMPARISON:  12/02/2014 FINDINGS: Changes of prior right hip replacement. Lucency and bone destruction noted within the proximal femur in the region of the greater trochanter and proximal shaft. This is concerning for possible infection/osteomyelitis. This is new since prior study. Mild degenerative changes in the left hip. Diffuse vascular calcifications. IMPRESSION: Prior right hip replacement. Lucency with cortical destruction noted in the proximal femur around the hip replacement stem concerning for infection/osteomyelitis. Electronically Signed   By: Rolm Baptise M.D.   On: 07/02/2020 09:10   PACEMAKER IN CLINIC CHECK  Note: Medical Device Follow-up Patient pacemaker was interrogated by pacemakers analyzer, battery status is okay.  No programming changes were indicated after the review of the data.  Histogram shows no change since the last interrogation Atrial and ventricular sensing thresholds were found to be acceptable Impedance was checked and it was found to be normal.  Thresholds were found to be okay on evaluation of rhythm problem.  No high rate or low rate arrhythmia were noted.  Estimated battery longevity is36 months. I have personally reviewed the device data and amended the report as necessary.   [place] FINE NEEDLE ASP 1ST LESION  Result Date: 07/07/2020 INDICATION: 87 year old woman with right thigh mass/fluid collection presents to  interventional radiology for ultrasound-guided aspiration/biopsy EXAM: Ultrasound-guided aspiration and biopsy of right upper thigh mass/fluid collection MEDICATIONS: None. ANESTHESIA/SEDATION: Moderate (conscious) sedation was employed during this procedure. A total of Versed 0 mg and Fentanyl 25 mcg was administered intravenously. Moderate Sedation Time: 10 minutes. The patient's level of consciousness and vital signs were monitored continuously by radiology nursing throughout the procedure under my direct supervision. COMPLICATIONS:  None immediate. PROCEDURE: Informed written consent was obtained from the patient after a thorough discussion of the procedural risks, benefits and alternatives. All questions were addressed. Maximal Sterile Barrier Technique was utilized including caps, mask, sterile gowns, sterile gloves, sterile drape, hand hygiene and skin antiseptic. A timeout was performed prior to the initiation of the procedure. Patient position supine on the ultrasound table. Right upper thigh skin prepped and draped in usual sterile fashion. Ultrasound evaluation of the right upper thigh again demonstrated the hypoechoic collection/mass. Following local lidocaine administration, 8 [place] Yueh needle was advanced into the lesion, however no fluid could be aspirated. 17 gauge introducer needle was then advanced into the right upper thigh lesion, and four 18 gauge cores were obtained utilizing continuous ultrasound guidance. Samples were sent to pathology in formalin. Needle removed and hemostasis achieved with 2 minutes of manual compression. Post procedure ultrasound images showed no evidence of significant hemorrhage. IMPRESSION: Ultrasound-guided biopsy of right thigh mass Electronically Signed   By: Miachel Roux M.D.   On: 07/07/2020 16:03   (Echo, Carotid, EGD, Colonoscopy, ERCP)    Subjective: Seen and examined.  Pain control improved.  Stable for dc to SNF  Discharge Exam: Vitals:   07/10/20 0423 07/10/20 0746  BP: (!) 114/43 132/64  Pulse: 60 63  Resp: 18 20  Temp: 98.1 F (36.7 C) 97.9 F (36.6 C)  SpO2: 96% 97%   Vitals:   07/09/20 2005 07/10/20 0133 07/10/20 0423 07/10/20 0746  BP: (!) 100/39 (!) 115/50 (!) 114/43 132/64  Pulse: 65 72 60 63  Resp: 18 18 18 20   Temp: 97.9 F (36.6 C) 98.2 F (36.8 C) 98.1 F (36.7 C) 97.9 F (36.6 C)  TempSrc:      SpO2: 96% 96% 96% 97%  Weight:   71 kg   Height:        General: Pt is alert, awake, not in acute distress Cardiovascular: RRR, S1/S2 +, no rubs, no  gallops Respiratory: CTA bilaterally, no wheezing, no rhonchi Abdominal: Soft, NT, ND, bowel sounds + Extremities: right thigh tender to touch, decreased ROM    The results of significant diagnostics from this hospitalization (including imaging, microbiology, ancillary and laboratory) are listed below for reference.     Microbiology: Recent Results (from the past 240 hour(s))  Resp Panel by RT-PCR (Flu A&B, Covid) Nasopharyngeal Swab     Status: None   Collection Time: 07/01/20  4:45 PM   Specimen: Nasopharyngeal Swab; Nasopharyngeal(NP) swabs in vial transport medium  Result Value Ref Range Status   SARS Coronavirus 2 by RT PCR NEGATIVE NEGATIVE Final    Comment: (NOTE) SARS-CoV-2 target nucleic acids are NOT DETECTED.  The SARS-CoV-2 RNA is generally detectable in upper respiratory specimens during the acute phase of infection. The lowest concentration of SARS-CoV-2 viral copies this assay can detect is 138 copies/mL. A negative result does not preclude SARS-Cov-2 infection and should not be used as the sole basis for treatment or other patient management decisions. A negative result may occur with  improper specimen collection/handling, submission of specimen other than nasopharyngeal swab, presence of  viral mutation(s) within the areas targeted by this assay, and inadequate number of viral copies(<138 copies/mL). A negative result must be combined with clinical observations, patient history, and epidemiological information. The expected result is Negative.  Fact Sheet for Patients:  EntrepreneurPulse.com.au  Fact Sheet for Healthcare Providers:  IncredibleEmployment.be  This test is no t yet approved or cleared by the [place] FDA and  has been authorized for detection and/or diagnosis of SARS-CoV-2 by FDA under an Emergency Use Authorization (EUA). This EUA will remain  in effect (meaning this test can be used) for the duration of  the COVID-19 declaration under Section 564(b)(1) of the Act, 21 U.S.C.section 360bbb-3(b)(1), unless the authorization is terminated  or revoked sooner.       Influenza A by PCR NEGATIVE NEGATIVE Final   Influenza B by PCR NEGATIVE NEGATIVE Final    Comment: (NOTE) The Xpert Xpress SARS-CoV-2/FLU/RSV plus assay is intended as an aid in the diagnosis of influenza from Nasopharyngeal swab specimens and should not be used as a sole basis for treatment. Nasal washings and aspirates are unacceptable for Xpert Xpress SARS-CoV-2/FLU/RSV testing.  Fact Sheet for Patients: EntrepreneurPulse.com.au  Fact Sheet for Healthcare Providers: IncredibleEmployment.be  This test is not yet approved or cleared by the [place] FDA and has been authorized for detection and/or diagnosis of SARS-CoV-2 by FDA under an Emergency Use Authorization (EUA). This EUA will remain in effect (meaning this test can be used) for the duration of the COVID-19 declaration under Section 564(b)(1) of the Act, 21 U.S.C. section 360bbb-3(b)(1), unless the authorization is terminated or revoked.  Performed at [facility], [address] [place], [place] [address]   Culture, blood (Routine X 2) w Reflex to ID Panel     Status: None   Collection Time: 07/02/20  4:24 PM   Specimen: BLOOD  Result Value Ref Range Status   Specimen Description BLOOD RIGHT ANTECUBITAL  Final   Special Requests   Final    BOTTLES DRAWN AEROBIC AND ANAEROBIC Blood Culture adequate volume   Culture   Final    NO GROWTH 5 DAYS Performed at [facility], [address] [place], [place] [address]    Report Status 07/07/2020 FINAL  Final  Culture, blood (Routine X 2) w Reflex to ID Panel     Status: None   Collection Time: 07/02/20  4:24 PM   Specimen: BLOOD  Result Value Ref Range Status   Specimen Description BLOOD LEFT ANTECUBITAL  Final   Special Requests   Final    BOTTLES  DRAWN AEROBIC AND ANAEROBIC Blood Culture adequate volume   Culture   Final    NO GROWTH 5 DAYS Performed at [facility], [address], [place], [place] [address]    Report Status 07/07/2020 FINAL  Final  Fungus Culture With Stain     Status: None (Preliminary result)   Collection Time: 07/03/20 10:16 AM   Specimen: PATH Cytology Misc. fluid; Synovial Fluid  Result Value Ref Range Status   Fungus Stain Final report  Final    Comment: (NOTE) Performed At: [facility] [place], [place] [id number] Rush Farmer MD PO:[id number]    Fungus (Mycology) Culture PENDING  Incomplete   Fungal Source SYNOVIAL  Final    Comment: Performed at [facility], [address] [place], [place] [address]  Body fluid culture w Gram Stain     Status: None   Collection Time: 07/03/20 10:16 AM   Specimen:  PATH Cytology Misc. fluid; Synovial Fluid  Result Value Ref Range Status   Specimen Description   Final    SYNOVIAL Performed at [facility], [address] [place], [place] [address]    Special Requests   Final    SYNOVIAL Performed at [facility], [place], [place] [address]    Gram Stain NO WBC SEEN NO ORGANISMS SEEN   Final   Culture   Final    NO GROWTH 3 DAYS Performed at [facility], [address] [place], [place] [address]    Report Status 07/07/2020 FINAL  Final  Fungus Culture Result     Status: None   Collection Time: 07/03/20 10:16 AM  Result Value Ref Range Status   Result 1 Comment  Final    Comment: (NOTE) KOH/Calcofluor preparation:  no fungus observed. Performed At: [facility] [place], [place] [id number] Rush Farmer MD XL:[id number]      Labs: BNP (last 3 results) No results for input(s): BNP in the last 8760 hours. Basic Metabolic Panel: Recent Labs  Lab 07/04/20 1413 07/07/20 0556 07/08/20 0933 07/09/20 0448 07/10/20 0441  NA  --   --   132* 133* 135  K  --   --  4.7 4.5 4.3  CL  --   --  98 99 102  CO2  --   --  27 28 28   GLUCOSE  --   --  112* 104* 125*  BUN  --   --  29* 27* 29*  CREATININE 0.74 0.98 0.78 0.73 0.73  CALCIUM  --   --  8.7* 8.7* 8.7*   Liver Function Tests: No results for input(s): AST, ALT, ALKPHOS, BILITOT, PROT, ALBUMIN in the last 168 hours. No results for input(s): LIPASE, AMYLASE in the last 168 hours. No results for input(s): AMMONIA in the last 168 hours. CBC: Recent Labs  Lab 07/04/20 1413 07/07/20 0556 07/08/20 0933 07/09/20 0448 07/10/20 0441  WBC 8.8 11.3* 10.8* 9.2 9.4  NEUTROABS  --   --  8.3* 6.7 6.8  HGB 10.6* 10.7* 9.4* 8.8* 8.6*  HCT 32.9* 34.4* 28.2* 27.5* 25.9*  MCV 81.6 84.5 80.3 82.6 80.9  PLT 369 421* 472* 464* 478*   Cardiac Enzymes: No results for input(s): CKTOTAL, CKMB, CKMBINDEX, TROPONINI in the last 168 hours. BNP: Invalid input(s): POCBNP CBG: No results for input(s): GLUCAP in the last 168 hours. D-Dimer No results for input(s): DDIMER in the last 72 hours. Hgb A1c No results for input(s): HGBA1C in the last 72 hours. Lipid Profile No results for input(s): CHOL, HDL, LDLCALC, TRIG, CHOLHDL, LDLDIRECT in the last 72 hours. Thyroid function studies No results for input(s): TSH, T4TOTAL, T3FREE, THYROIDAB in the last 72 hours.  Invalid input(s): FREET3 Anemia work up No results for input(s): VITAMINB12, FOLATE, FERRITIN, TIBC, IRON, RETICCTPCT in the last 72 hours. Urinalysis    Component Value Date/Time   COLORURINE YELLOW (A) 07/17/2015 1326   APPEARANCEUR Clear 02/11/2020 1124   LABSPEC 1.012 07/17/2015 1326   LABSPEC 1.019 07/29/2013 1427   PHURINE 5.0 07/17/2015 1326   GLUCOSEU Negative 02/11/2020 1124   GLUCOSEU Negative 07/29/2013 1427   HGBUR NEGATIVE 07/17/2015 1326   BILIRUBINUR Negative 02/11/2020 1124   BILIRUBINUR Negative 07/29/2013 1427   KETONESUR NEGATIVE 07/17/2015 1326   PROTEINUR Trace 02/11/2020 1124   PROTEINUR NEGATIVE  07/17/2015 1326   UROBILINOGEN 0.2 07/15/2019 1551   UROBILINOGEN 1.0 06/21/2011 1000   NITRITE  Negative 02/11/2020 1124   NITRITE POSITIVE (A) 07/17/2015 1326   LEUKOCYTESUR Negative 02/11/2020 1124   LEUKOCYTESUR 3+ 07/29/2013 1427   Sepsis Labs Invalid input(s): PROCALCITONIN,  WBC,  LACTICIDVEN Microbiology Recent Results (from the past 240 hour(s))  Resp Panel by RT-PCR (Flu A&B, Covid) Nasopharyngeal Swab     Status: None   Collection Time: 07/01/20  4:45 PM   Specimen: Nasopharyngeal Swab; Nasopharyngeal(NP) swabs in vial transport medium  Result Value Ref Range Status   SARS Coronavirus 2 by RT PCR NEGATIVE NEGATIVE Final    Comment: (NOTE) SARS-CoV-2 target nucleic acids are NOT DETECTED.  The SARS-CoV-2 RNA is generally detectable in upper respiratory specimens during the acute phase of infection. The lowest concentration of SARS-CoV-2 viral copies this assay can detect is 138 copies/mL. A negative result does not preclude SARS-Cov-2 infection and should not be used as the sole basis for treatment or other patient management decisions. A negative result may occur with  improper specimen collection/handling, submission of specimen other than nasopharyngeal swab, presence of viral mutation(s) within the areas targeted by this assay, and inadequate number of viral copies(<138 copies/mL). A negative result must be combined with clinical observations, patient history, and epidemiological information. The expected result is Negative.  Fact Sheet for Patients:  EntrepreneurPulse.com.au  Fact Sheet for Healthcare Providers:  IncredibleEmployment.be  This test is no t yet approved or cleared by the [place] FDA and  has been authorized for detection and/or diagnosis of SARS-CoV-2 by FDA under an Emergency Use Authorization (EUA). This EUA will remain  in effect (meaning this test can be used) for the duration of the COVID-19  declaration under Section 564(b)(1) of the Act, 21 U.S.C.section 360bbb-3(b)(1), unless the authorization is terminated  or revoked sooner.       Influenza A by PCR NEGATIVE NEGATIVE Final   Influenza B by PCR NEGATIVE NEGATIVE Final    Comment: (NOTE) The Xpert Xpress SARS-CoV-2/FLU/RSV plus assay is intended as an aid in the diagnosis of influenza from Nasopharyngeal swab specimens and should not be used as a sole basis for treatment. Nasal washings and aspirates are unacceptable for Xpert Xpress SARS-CoV-2/FLU/RSV testing.  Fact Sheet for Patients: EntrepreneurPulse.com.au  Fact Sheet for Healthcare Providers: IncredibleEmployment.be  This test is not yet approved or cleared by the [place] FDA and has been authorized for detection and/or diagnosis of SARS-CoV-2 by FDA under an Emergency Use Authorization (EUA). This EUA will remain in effect (meaning this test can be used) for the duration of the COVID-19 declaration under Section 564(b)(1) of the Act, 21 U.S.C. section 360bbb-3(b)(1), unless the authorization is terminated or revoked.  Performed at [facility], [address] [place], [place] [address]   Culture, blood (Routine X 2) w Reflex to ID Panel     Status: None   Collection Time: 07/02/20  4:24 PM   Specimen: BLOOD  Result Value Ref Range Status   Specimen Description BLOOD RIGHT ANTECUBITAL  Final   Special Requests   Final    BOTTLES DRAWN AEROBIC AND ANAEROBIC Blood Culture adequate volume   Culture   Final    NO GROWTH 5 DAYS Performed at [facility], [address], [place], [place] [address]    Report Status 07/07/2020 FINAL  Final  Culture, blood (Routine X 2) w Reflex to ID Panel     Status: None   Collection Time: 07/02/20  4:24 PM   Specimen: BLOOD  Result Value Ref Range Status  Specimen Description BLOOD LEFT ANTECUBITAL  Final   Special Requests   Final    BOTTLES DRAWN AEROBIC  AND ANAEROBIC Blood Culture adequate volume   Culture   Final    NO GROWTH 5 DAYS Performed at [facility], [address] [place], [place] [address]    Report Status 07/07/2020 FINAL  Final  Fungus Culture With Stain     Status: None (Preliminary result)   Collection Time: 07/03/20 10:16 AM   Specimen: PATH Cytology Misc. fluid; Synovial Fluid  Result Value Ref Range Status   Fungus Stain Final report  Final    Comment: (NOTE) Performed At: [facility] [place], [place] [id number] Rush Farmer MD YJ:[id number]    Fungus (Mycology) Culture PENDING  Incomplete   Fungal Source SYNOVIAL  Final    Comment: Performed at [facility], [address] [place], [place] [address]  Body fluid culture w Gram Stain     Status: None   Collection Time: 07/03/20 10:16 AM   Specimen: PATH Cytology Misc. fluid; Synovial Fluid  Result Value Ref Range Status   Specimen Description   Final    SYNOVIAL Performed at [facility], [address] [place], [place] [address]    Special Requests   Final    SYNOVIAL Performed at [facility], [place], [place] [address]    Gram Stain NO WBC SEEN NO ORGANISMS SEEN   Final   Culture   Final    NO GROWTH 3 DAYS Performed at [facility], [place] [address] [place], [place] [address]    Report Status 07/07/2020 FINAL  Final  Fungus Culture Result     Status: None   Collection Time: 07/03/20 10:16 AM  Result Value Ref Range Status   Result 1 Comment  Final    Comment: (NOTE) KOH/Calcofluor preparation:  no fungus observed. Performed At: [facility] [place], [place] [id number] Rush Farmer MD GG:[id number]      Time coordinating discharge: Over 30 minutes  SIGNED:   Sidney Ace, MD  [facility] 07/10/2020, 10:29 AM Pager   If 7PM-7AM, please contact night-coverage

## 2020-07-10 NOTE — Progress Notes (Signed)
PROGRESS NOTE    Michele SHUPERT  OHY:[id number] DOB: 09-17-1931 DOA: 07/01/2020 PCP: [name], MD   Brief Narrative:  Michele Bradby Reeseis a 87 y.o.femalewith medical history significant forright hip fracture in 2013 status post right total hip arthroplasty,paroxysmal atrial fibrillation complicated by sick sinus syndrome status post pacemaker placement, hypertension, chronic diastolic heart failure, generalized anxiety disorder, moderatepersistentasthma, who [facility]with acute right hip painafter [facility] of [facility] pain.  She confirms a history of right hip fracture back in 2013, at which time she underwent total right hip arthroplasty without anyreportedensuing complications from this procedure 2/17-Per Dr. [name] via messaging, need to have IR aspirate rt hip for gram stain and culture r/o infection as xray suggestive. 2/18-plan for aspiration today by IR.  2/19-spoke to Dr. [name] radiology he reported we should obtain US of anterior hip to r/o liquid or mass, if this was negative then consider MR for further evalatuion 2/20-rt hip anterior [place] completed. Send message to oncall ortho for review, if we need MR for further evaluation.  2/21-pain this am.spoke to ID and ortho, and radiology, plan to do another aspirate +/- hip bx in am as she got lovenox today 2/22: Status post IR guided biopsy of right thigh 2/23-communicated with IR and ID via secure chat.  Per IR thigh lesion was more solid and vascular then ultrasound had suggested.  Biopsies were taken and sent however no culture was sent.  Patient evaluated bedside.  Still with significant pain in the right thigh. 2/24: Pain control seems to be slowly improving. Patient had some issues with delirium and mental status changes. Appears to back to baseline this morning. Communicated with pathology. 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 2/25: Physical exam  continues to improve.  Mobility and pain improved.  Plan to discharge [facility] over the weekend.   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  Patient seems to be responding to current regimen of Robaxin and oxycodone.  We will plan to monitor for the next 24 hours and potential discharge to [place] facility over the weekend. -Outpatient orthopedic oncology recommended discharge.  Names of 2 specialist provided to patient's daughter-in-law via phone  Acute right hip and thigh pain Patient was apparently ambulatory 3 weeks prior Hip has caused decreased level of mobility in substantial pain Per Ortho no evidence of fracture and patient is a poor surgical candidate ID was consulted given concern for possible infectious etiology Patient without leukocytosis, afebrile, normal inflammatory markers Patient underwent IR guided biopsy of the right hip Biopsy positive for proliferative myositis/fasciitis Infection or malignancy appears to be off differential at this time Plan: Multimodal pain control Therapy evaluations Discharge to skilled nursing facility ideally within 24 hours  Nonocclusive DVT central right femoral vein Patient on Lovenox [facility] hematology consult for now, will reconsult as necessary.  Discussed with Dr. [name] Plan: DC Lovenox.  Transition back to Eliquis  Chronic diastolic congestive heart failure No indication for exacerbation currently Monitor clinically I's and O's Daily weights  Paroxysmal atrial fibrillation Currently rate controlled Eliquis as above  Essential hypertension stable Continue amlodipine 5 mg daily  Generalized anxiety disorder Continue Prozac  Xanax as needed  Moderate persistent asthma Outpatient respiratory regimen consists of scheduled Advair, Singulair. No clinical evidence to suggest acute exacerbation.  GERD PPI    DVT prophylaxis: Lovenox mg/kg BID Code Status: FULL Family Communication: daughter at bedside Disposition Plan:Status is: Inpatient  Remains inpatient appropriate because:Inpatient level of care appropriate due to severity of illness   Dispo: The patient is from: Home              Anticipated d/c is to: SNF              Patient currently is not medically stable to d/c.   Difficult to place patient No   Anticipated date of discharge 2/26 if pain controlled.  Disposition plan to skilled nursing facility.           Level of care: Med-Surg  Consultants:   ID  Orthopedics  IR  Procedures:   Right thigh mass biopsy, 2/22  Antimicrobials:   None   Subjective: patient seen and examined.  Daughter at bedside.  Pain control effective.  Pain improving.  Objective: Vitals:   07/10/20 0133 07/10/20 0423 07/10/20 0746 07/10/20 1129  BP: (!) 115/50 (!) 114/43 132/64 (!) 109/47  Pulse: 72 60 63 65  Resp: 18 18 20 18   Temp: 98.2 F (36.8 C) 98.1 F (36.7 C) 97.9 F (36.6 C) 98 F (36.7 C)  TempSrc:      SpO2: 96% 96% 97% 95%  Weight:  71 kg    Height:        Intake/Output Summary (Last 24 hours) at 07/10/2020 1211 Last data filed at 07/10/2020 1022 Gross per 24 hour  Intake 600 ml  Output 250 ml  Net 350 ml   Filed Weights   07/08/20 0500 07/09/20 0500 07/10/20 0423   Weight: 70 kg 69.9 kg 71 kg    Examination:  General exam: Appears calm and comfortable  Respiratory system: Clear to auscultation. Respiratory effort normal. Cardiovascular system: S1 & S2 heard, RRR. No JVD, murmurs, rubs, gallops or clicks. No pedal edema. Gastrointestinal system: Abdomen is nondistended, soft and nontender. No organomegaly or masses felt. Normal bowel sounds heard. Central nervous system: Alert and oriented. No focal neurological deficits. Extremities: Decreased power right lower extremity Skin: Small area of ecchymoses right thigh, improving over interval Psychiatry: Judgement and insight appear normal. Mood & affect appropriate.     Data Reviewed: I have personally reviewed following labs and imaging studies  CBC: Recent Labs  Lab 07/04/20 1413 07/07/20 0556 07/08/20 0933 07/09/20 0448 07/10/20 0441  WBC 8.8 11.3* 10.8* 9.2 9.4  NEUTROABS  --   --  8.3* 6.7 6.8  HGB 10.6* 10.7* 9.4* 8.8* 8.6*  HCT 32.9* 34.4* 28.2* 27.5* 25.9*  MCV 81.6 84.5 80.3 82.6 80.9  PLT 369 421* 472* 464* 427*   Basic Metabolic Panel: Recent Labs  Lab 07/04/20 1413 07/07/20 0556 07/08/20 0933 07/09/20 0448 07/10/20 0441  NA  --   --  132* 133* 135  K  --   --  4.7 4.5 4.3  CL  --   --  98 99 102  CO2  --   --  27  28 28  GLUCOSE  --   --  112* 104* 125*  BUN  --   --  29* 27* 29*  CREATININE 0.74 0.98 0.78 0.73 0.73  CALCIUM  --   --  8.7* 8.7* 8.7*   GFR: Estimated Creatinine Clearance: 48.8 mL/min (by C-G formula based on SCr of 0.73 mg/dL). Liver Function Tests: No results for input(s): AST, ALT, ALKPHOS, BILITOT, PROT, ALBUMIN in the last 168 hours. No results for input(s): LIPASE, AMYLASE in the last 168 hours. No results for input(s): AMMONIA in the last 168 hours. Coagulation Profile: Recent Labs  Lab 07/04/20 1413  INR 1.1   Cardiac Enzymes: No results for input(s): CKTOTAL, CKMB, CKMBINDEX, TROPONINI in the last 168 hours. BNP (last 3  results) No results for input(s): PROBNP in the last 8760 hours. HbA1C: No results for input(s): HGBA1C in the last 72 hours. CBG: No results for input(s): GLUCAP in the last 168 hours. Lipid Profile: No results for input(s): CHOL, HDL, LDLCALC, TRIG, CHOLHDL, LDLDIRECT in the last 72 hours. Thyroid Function Tests: No results for input(s): TSH, T4TOTAL, FREET4, T3FREE, THYROIDAB in the last 72 hours. Anemia Panel: No results for input(s): VITAMINB12, FOLATE, FERRITIN, TIBC, IRON, RETICCTPCT in the last 72 hours. Sepsis Labs: No results for input(s): PROCALCITON, LATICACIDVEN in the last 168 hours.  Recent Results (from the past 240 hour(s))  Resp Panel by RT-PCR (Flu A&B, Covid) Nasopharyngeal Swab     Status: None   Collection Time: 07/01/20  4:45 PM   Specimen: Nasopharyngeal Swab; Nasopharyngeal(NP) swabs in vial transport medium  Result Value Ref Range Status   SARS Coronavirus 2 by RT PCR NEGATIVE NEGATIVE Final    Comment: (NOTE) SARS-CoV-2 target nucleic acids are NOT DETECTED.  The SARS-CoV-2 RNA is generally detectable in upper respiratory specimens during the acute phase of infection. The lowest concentration of SARS-CoV-2 viral copies this assay can detect is 138 copies/mL. A negative result does not preclude SARS-Cov-2 infection and should not be used as the sole basis for treatment or other patient management decisions. A negative result may occur with  improper specimen collection/handling, submission of specimen other than nasopharyngeal swab, presence of viral mutation(s) within the areas targeted by this assay, and inadequate number of viral copies(<138 copies/mL). A negative result must be combined with clinical observations, patient history, and epidemiological information. The expected result is Negative.  Fact Sheet for Patients:  EntrepreneurPulse.com.au  Fact Sheet for Healthcare Providers:   IncredibleEmployment.be  This test is no t yet approved or cleared by the [place] FDA and  has been authorized for detection and/or diagnosis of SARS-CoV-2 by FDA under an Emergency Use Authorization (EUA). This EUA will remain  in effect (meaning this test can be used) for the duration of the COVID-19 declaration under Section 564(b)(1) of the Act, 21 U.S.C.section 360bbb-3(b)(1), unless the authorization is terminated  or revoked sooner.       Influenza A by PCR NEGATIVE NEGATIVE Final   Influenza B by PCR NEGATIVE NEGATIVE Final    Comment: (NOTE) The Xpert Xpress SARS-CoV-2/FLU/RSV plus assay is intended as an aid in the diagnosis of influenza from Nasopharyngeal swab specimens and should not be used as a sole basis for treatment. Nasal washings and aspirates are unacceptable for Xpert Xpress SARS-CoV-2/FLU/RSV testing.  Fact Sheet for Patients: EntrepreneurPulse.com.au  Fact Sheet for Healthcare Providers: IncredibleEmployment.be  This test is not yet approved or cleared by the [place] FDA and has been authorized for detection and/or diagnosis  of SARS-CoV-2 by FDA under an Emergency Use Authorization (EUA). This EUA will remain in effect (meaning this test can be used) for the duration of the COVID-19 declaration under Section 564(b)(1) of the Act, 21 U.S.C. section 360bbb-3(b)(1), unless the authorization is terminated or revoked.  Performed at [facility], [address] [place], [place] [address]   Culture, blood (Routine X 2) w Reflex to ID Panel     Status: None   Collection Time: 07/02/20  4:24 PM   Specimen: BLOOD  Result Value Ref Range Status   Specimen Description BLOOD RIGHT ANTECUBITAL  Final   Special Requests   Final    BOTTLES DRAWN AEROBIC AND ANAEROBIC Blood Culture adequate volume   Culture   Final    NO GROWTH 5 DAYS Performed at [facility], [address], [place], [place] [address]    Report Status 07/07/2020 FINAL  Final  Culture, blood (Routine X 2) w Reflex to ID Panel     Status: None   Collection Time: 07/02/20  4:24 PM   Specimen: BLOOD  Result Value Ref Range Status   Specimen Description BLOOD LEFT ANTECUBITAL  Final   Special Requests   Final    BOTTLES DRAWN AEROBIC AND ANAEROBIC Blood Culture adequate volume   Culture   Final    NO GROWTH 5 DAYS Performed at [facility], [address] [place], [place] [address]    Report Status 07/07/2020 FINAL  Final  Fungus Culture With Stain     Status: None (Preliminary result)   Collection Time: 07/03/20 10:16 AM   Specimen: PATH Cytology Misc. fluid; Synovial Fluid  Result Value Ref Range Status   Fungus Stain Final report  Final    Comment: (NOTE) Performed At: [facility] [place], [place] [id number] Rush Farmer MD [id number]    Fungus (Mycology) Culture PENDING  Incomplete   Fungal Source SYNOVIAL  Final    Comment: Performed at [facility], [address] [place], [place] [address]  Body fluid culture w Gram Stain     Status: None   Collection Time: 07/03/20 10:16 AM   Specimen: PATH Cytology Misc. fluid; Synovial Fluid  Result Value Ref Range Status   Specimen Description   Final    SYNOVIAL Performed at [facility], [address] [place], [place] [address]    Special Requests   Final    SYNOVIAL Performed at [facility], [place],  [address]    Gram Stain NO WBC SEEN NO ORGANISMS SEEN   Final   Culture   Final    NO GROWTH 3 DAYS Performed at [facility], [place] [address] [place],  [address]    Report Status 07/07/2020 FINAL  Final  Fungus Culture Result     Status: None   Collection Time: 07/03/20 10:16 AM  Result Value Ref Range Status   Result 1 Comment  Final    Comment: (NOTE) KOH/Calcofluor preparation:  no fungus observed. Performed At:  [facility] [place], [place] [id number] Rush Farmer MD BJ:[id number]          Radiology Studies: No results found.      Scheduled Meds: . acetaminophen  650 mg Oral TID AC & HS  . apixaban  10 mg Oral BID   Followed by  . [START ON 07/17/2020] apixaban  5 mg Oral BID  . brimonidine  1 drop Both Eyes  Q12H   And  . timolol  1 drop Both Eyes Q12H  . feeding supplement (NEPRO CARB STEADY)  237 mL Oral TID BM  . FLUoxetine  40 mg Oral Daily  . fluticasone furoate-vilanterol  1 puff Inhalation Daily  . lidocaine  1 patch Transdermal Q24H  . methocarbamol  750 mg Oral TID  . montelukast  10 mg Oral Daily  . moxifloxacin  1 drop Both Eyes TID  . multivitamin with minerals  1 tablet Oral Daily  . nepafenac  1 drop Both Eyes Daily  . Netarsudil-Latanoprost  1 drop Both Eyes QPM  . pantoprazole  20 mg Oral Daily  . polyethylene glycol  17 g Oral Daily  . senna-docusate  2 tablet Oral BID  . sotalol  80 mg Oral Daily   Continuous Infusions:   LOS: 9 days    Time spent: 15 minutes    Sidney Ace, MD [facility] Pager [phone]  If 7PM-7AM, please contact night-coverage 07/10/2020, 12:11 PM

## 2020-07-10 NOTE — Consult Note (Signed)
ANTICOAGULATION CONSULT NOTE - follow-up Pharmacy Consult for Enoxaparin Indication: atrial fibrillation/VTE treatment  Allergies  Allergen Reactions  . Aminoglycosides   . Bee Pollen   . Sulfa Antibiotics Itching  . Tramadol     Makes her feel "crazy"  . Morphine And Related Other (See Comments)    unknown    Patient Measurements: Height: 5\' 4"  (162.6 cm) Weight: 71 kg (156 lb 8.4 oz) IBW/kg (Calculated) : 54.7  Vital Signs: Temp: 97.9 F (36.6 C) (02/25 0746) BP: 132/64 (02/25 0746) Pulse Rate: 63 (02/25 0746)  Labs: Recent Labs    07/08/20 0933 07/09/20 0448 07/10/20 0441  HGB 9.4* 8.8* 8.6*  HCT 28.2* 27.5* 25.9*  PLT 472* 464* 478*  CREATININE 0.78 0.73 0.73    Estimated Creatinine Clearance: 48.8 mL/min (by C-G formula based on SCr of 0.73 mg/dL).   Medical History: Past Medical History:  Diagnosis Date  . Arthritis    Hands, feet, jaw  . Asthma   . CHF (congestive heart failure) ([place])   . Depression   . DVT (deep venous thrombosis) ([place])   . Dysrhythmia    A-FIB  . Fracture of hip ([place])    right  . Full dentures    UPPER AND LOWER  . Headache(784.0)   . Hypercholesterolemia   . Hypertension    CONTROLLED ON MEDS  . PE (pulmonary embolism)   . Presence of permanent cardiac pacemaker   . Stroke (cerebrum) (HCC)    DIFFICULT FOR HER TO COMPLETE HER SENTENCES,RESPONDS SLOWLY  . Stroke [facility]) 2006   CVA-CEREBRAL ANEURYSM  . UTI (urinary tract infection)    FREQUENT  . Wears dentures    full upper and lower    Medications:  Eliquis  Assessment: 87 y.o.female with history significantparoxysmal atrial fibrillation complicated by sick sinus syndrome status post pacemaker placement on Eliquis pta (dose unclear)   HGB slow trend down 9.4>8.8>8.6 - will follow  Goal of Therapy:   Monitor platelets by anticoagulation protocol: Yes   Plan:  Continue Lovenox 1mg /kg q12h Will obtain CBC/Scr q3-7 days per protocol  Lu Duffel,  PharmD, BCPS Clinical Pharmacist 07/10/2020 9:33 AM

## 2020-07-10 NOTE — Consult Note (Signed)
ANTICOAGULATION CONSULT NOTE - follow-up Pharmacy Consult for Apixiban Indication: DVT treatment  Allergies  Allergen Reactions  . Aminoglycosides   . Bee Pollen   . Sulfa Antibiotics Itching  . Tramadol     Makes her feel "crazy"  . Morphine And Related Other (See Comments)    unknown    Patient Measurements: Height: 5\' 4"  (162.6 cm) Weight: 71 kg (156 lb 8.4 oz) IBW/kg (Calculated) : 54.7  Vital Signs: Temp: 97.9 F (36.6 C) (02/25 0746) BP: 132/64 (02/25 0746) Pulse Rate: 63 (02/25 0746)  Labs: Recent Labs    07/08/20 0933 07/09/20 0448 07/10/20 0441  HGB 9.4* 8.8* 8.6*  HCT 28.2* 27.5* 25.9*  PLT 472* 464* 478*  CREATININE 0.78 0.73 0.73    Estimated Creatinine Clearance: 48.8 mL/min (by C-G formula based on SCr of 0.73 mg/dL).   Medical History: Past Medical History:  Diagnosis Date  . Arthritis    Hands, feet, jaw  . Asthma   . CHF (congestive heart failure) ([place])   . Depression   . DVT (deep venous thrombosis) ([employer])   . Dysrhythmia    A-FIB  . Fracture of hip ([place])    right  . Full dentures    UPPER AND LOWER  . Headache(784.0)   . Hypercholesterolemia   . Hypertension    CONTROLLED ON MEDS  . PE (pulmonary embolism)   . Presence of permanent cardiac pacemaker   . Stroke (cerebrum) (HCC)    DIFFICULT FOR HER TO COMPLETE HER SENTENCES,RESPONDS SLOWLY  . Stroke [facility]) 2006   CVA-CEREBRAL ANEURYSM  . UTI (urinary tract infection)    FREQUENT  . Wears dentures    full upper and lower    Medications:  Eliquis  Assessment: 87 y.o.female with history significantparoxysmal atrial fibrillation complicated by sick sinus syndrome status post pacemaker placement on Eliquis pta (dose 2.5mg )   HGB slow trend down 9.4>8.8>8.6 - will follow  R lower extremity examined with venous doppler ultrasound revealing short-segment, nonocclusive focal thrombus in the central femoral vein.  Pharmacy consulted for Eliquis DVT treatment dosing.  Goal of  Therapy:   Monitor platelets by anticoagulation protocol: Yes   Plan:  Will start Eliquis DVT treatment dosing this evening (pt already received lovenox this am)  Eliquis 10mg  bid x 7 days, followed by Eliquis 5mg  bid thereafter  CBC's as warranted  Lu Duffel, PharmD, BCPS Clinical Pharmacist 07/10/2020 10:30 AM

## 2020-07-10 NOTE — Care Management Important Message (Signed)
Important Message  Patient Details  Name: Michele Summers MRN: [id number] Date of Birth: Dec 23, 1931   Medicare Important Message Given:  Yes     Juliann Pulse A Allmond 07/10/2020, 11:26 AM

## 2020-07-10 NOTE — Progress Notes (Signed)
Physical Therapy Treatment Patient Details Name: Michele Summers MRN: [id number] DOB: 1932/03/05 Today's Date: 07/10/2020    History of Present Illness Pt is an 87 yo female arrived at the ED with R hip pain and difficulty walking.  Per pt and daughter present at bedside the pt had imaging done at [facility] over a weak ago with no signs of fracture.  Per patient's daughter they were told the pain was likely from bursitis.  PMH includes R THA 2013, CHF, PE, and CVA.    PT Comments    Patient alert, agreeable to PT, did not quantify or provide a pain scale, but exhibited moderate pain signs/symptoms with mobility and transitional movements. The patient did demonstrated improved tolerance as well as standing balance this session, but still continued to need significant assistance, pain with mobility, and fatigue. Several supine exercises performed, AAROM on RLE due to pain. Supine to sit maxA with step by step cueing. Able to sit EOB with fair balance and BUE support, cued for foot flat bilaterally. Sit <> stand twice with RW and maxAx2. Once up in standing pt able to progress to minAx1-2 to maintain balance, PT foot under pt's R foot to assess ability to maintain TTWB precautions. With cueing pt able to maintain ~80% of the time in standing. Returned to supine totalAx2, and repositioned with all needs in reach. The patient would benefit from further skilled PT intervention to continue to progress towards goals. Recommendation remains appropriate.       Follow Up Recommendations  SNF;Supervision for mobility/OOB     Equipment Recommendations  Rolling walker with 5" wheels;3in1 (PT)    Recommendations for Other Services       Precautions / Restrictions Precautions Precautions: Fall Restrictions Weight Bearing Restrictions: Yes RLE Weight Bearing: Touchdown weight bearing    Mobility  Bed Mobility Overal bed mobility: Needs Assistance Bed Mobility: Supine to Sit;Sit to Supine      Supine to sit: Max assist;HOB elevated Sit to supine: Total assist;+2 for safety/equipment        Transfers Overall transfer level: Needs assistance Equipment used: Rolling walker (2 wheeled) Transfers: Sit to/from Stand Sit to Stand: From elevated surface;Max assist;+2 physical assistance         General transfer comment: once up in standing, progressed to minAx1-2, posterior lean noted, tactile and verbal cueing to maintain TTWB.  Ambulation/Gait             General Gait Details: deferred at this time due to patient fatigue   Stairs             Wheelchair Mobility    Modified Rankin (Stroke Patients Only)       Balance Overall balance assessment: Needs assistance Sitting-balance support: Feet supported;Bilateral upper extremity supported Sitting balance-Leahy Scale: Fair       Standing balance-Leahy Scale: Poor Standing balance comment: Heavy lean on the RW for support, but did progress to minAx1 to maintain balance momentarily in standing                            Cognition Arousal/Alertness: Awake/alert Behavior During Therapy: WFL for tasks assessed/performed Overall Cognitive Status: Within Functional Limits for tasks assessed                                        Exercises General Exercises - Lower  Extremity Ankle Circles/Pumps: AROM;Both;10 reps Quad Sets: AROM;Strengthening;Right;10 reps Heel Slides: AAROM;Strengthening;Right;10 reps;AROM;Left Hip ABduction/ADduction: AAROM;Strengthening;Right;10 reps;AROM;Left    General Comments        Pertinent Vitals/Pain Pain Assessment: Faces Faces Pain Scale: Hurts even more Pain Location: R hip with mobility attempts and transitional movements. Pt unable to quantify/provide a number this AM Pain Descriptors / Indicators: Guarding;Grimacing;Moaning Pain Intervention(s): Limited activity within patient's tolerance;Repositioned;Monitored during  session;Premedicated before session    Home Living                      Prior Function            PT Goals (current goals can now be found in the care plan section) Progress towards PT goals: Progressing toward goals    Frequency    Min 2X/week      PT Plan Current plan remains appropriate    Co-evaluation              AM-PAC PT "6 Clicks" Mobility   Outcome Measure  Help needed turning from your back to your side while in a flat bed without using bedrails?: A Lot Help needed moving from lying on your back to sitting on the side of a flat bed without using bedrails?: A Lot Help needed moving to and from a bed to a chair (including a wheelchair)?: Total Help needed standing up from a chair using your arms (e.g., wheelchair or bedside chair)?: Total Help needed to walk in hospital room?: Total Help needed climbing 3-5 steps with a railing? : Total 6 Click Score: 8    End of Session Equipment Utilized During Treatment: Gait belt Activity Tolerance: Patient tolerated treatment well;Patient limited by fatigue Patient left: in bed;with call bell/phone within reach;with bed alarm set;with SCD's reapplied Nurse Communication: Mobility status;Weight bearing status PT Visit Diagnosis: Difficulty in walking, not elsewhere classified (R26.2);Muscle weakness (generalized) (M62.81);Pain Pain - Right/Left: Right Pain - part of body: Hip     Time: 6546-5035 PT Time Calculation (min) (ACUTE ONLY): 43 min  Charges:  $Therapeutic Exercise: 38-52 mins                     Lieutenant Diego PT, DPT 10:46 AM,07/10/20

## 2020-07-10 NOTE — TOC Progression Note (Addendum)
Transition of Care [facility]) - Progression Note    Patient Details  Name: Michele Summers MRN: [id number] Date of Birth: [birth year]  Transition of Care [facility]) CM/SW [place], LCSW Phone Number: 07/10/2020, 9:00 AM  Clinical Narrative:   Patient not medically ready to DC to [facility] today, possibly within 24 H.   Updated Magda Paganini. Per Magda Paganini, they can only take patients over the weekend if DC Summary is in by 3pm on Friday, notified MD who placed DC summary. Magda Paganini confirmed she can take patient over the weekend. COVID test being ordered by MD.   Julienne Kass TOC will continue to follow.  Expected Discharge Plan: [place] Barriers to Discharge: Continued Medical Work up  Expected Discharge Plan and Services Expected Discharge Plan: [place] arrangements for the past 2 months: Single Family Home                                       Social Determinants of Health (SDOH) Interventions    Readmission Risk Interventions No flowsheet data found.

## 2020-07-11 DIAGNOSIS — I82411 Acute embolism and thrombosis of right femoral vein: Secondary | ICD-10-CM | POA: Diagnosis present

## 2020-07-11 DIAGNOSIS — K219 Gastro-esophageal reflux disease without esophagitis: Secondary | ICD-10-CM | POA: Diagnosis not present

## 2020-07-11 DIAGNOSIS — Z86711 Personal history of pulmonary embolism: Secondary | ICD-10-CM | POA: Diagnosis not present

## 2020-07-11 DIAGNOSIS — R279 Unspecified lack of coordination: Secondary | ICD-10-CM | POA: Diagnosis not present

## 2020-07-11 DIAGNOSIS — I495 Sick sinus syndrome: Secondary | ICD-10-CM | POA: Diagnosis not present

## 2020-07-11 DIAGNOSIS — R509 Fever, unspecified: Secondary | ICD-10-CM | POA: Diagnosis not present

## 2020-07-11 DIAGNOSIS — D2121 Benign neoplasm of connective and other soft tissue of right lower limb, including hip: Secondary | ICD-10-CM | POA: Diagnosis not present

## 2020-07-11 DIAGNOSIS — Z20822 Contact with and (suspected) exposure to covid-19: Secondary | ICD-10-CM | POA: Diagnosis present

## 2020-07-11 DIAGNOSIS — L89152 Pressure ulcer of sacral region, stage 2: Secondary | ICD-10-CM | POA: Diagnosis present

## 2020-07-11 DIAGNOSIS — M159 Polyosteoarthritis, unspecified: Secondary | ICD-10-CM | POA: Diagnosis not present

## 2020-07-11 DIAGNOSIS — M60851 Other myositis, right thigh: Secondary | ICD-10-CM | POA: Diagnosis present

## 2020-07-11 DIAGNOSIS — J4540 Moderate persistent asthma, uncomplicated: Secondary | ICD-10-CM | POA: Diagnosis not present

## 2020-07-11 DIAGNOSIS — Z8673 Personal history of transient ischemic attack (TIA), and cerebral infarction without residual deficits: Secondary | ICD-10-CM | POA: Diagnosis not present

## 2020-07-11 DIAGNOSIS — G4700 Insomnia, unspecified: Secondary | ICD-10-CM | POA: Diagnosis present

## 2020-07-11 DIAGNOSIS — H409 Unspecified glaucoma: Secondary | ICD-10-CM | POA: Diagnosis present

## 2020-07-11 DIAGNOSIS — M25551 Pain in right hip: Secondary | ICD-10-CM | POA: Diagnosis not present

## 2020-07-11 DIAGNOSIS — Z882 Allergy status to sulfonamides status: Secondary | ICD-10-CM | POA: Diagnosis not present

## 2020-07-11 DIAGNOSIS — M729 Fibroblastic disorder, unspecified: Secondary | ICD-10-CM | POA: Diagnosis not present

## 2020-07-11 DIAGNOSIS — H40053 Ocular hypertension, bilateral: Secondary | ICD-10-CM | POA: Diagnosis not present

## 2020-07-11 DIAGNOSIS — I5032 Chronic diastolic (congestive) heart failure: Secondary | ICD-10-CM | POA: Diagnosis present

## 2020-07-11 DIAGNOSIS — D638 Anemia in other chronic diseases classified elsewhere: Secondary | ICD-10-CM | POA: Diagnosis present

## 2020-07-11 DIAGNOSIS — F410 Panic disorder [episodic paroxysmal anxiety] without agoraphobia: Secondary | ICD-10-CM | POA: Diagnosis not present

## 2020-07-11 DIAGNOSIS — I110 Hypertensive heart disease with heart failure: Secondary | ICD-10-CM | POA: Diagnosis present

## 2020-07-11 DIAGNOSIS — Z79899 Other long term (current) drug therapy: Secondary | ICD-10-CM | POA: Diagnosis not present

## 2020-07-11 DIAGNOSIS — M79651 Pain in right thigh: Secondary | ICD-10-CM | POA: Diagnosis not present

## 2020-07-11 DIAGNOSIS — F321 Major depressive disorder, single episode, moderate: Secondary | ICD-10-CM | POA: Diagnosis present

## 2020-07-11 DIAGNOSIS — Z885 Allergy status to narcotic agent status: Secondary | ICD-10-CM | POA: Diagnosis not present

## 2020-07-11 DIAGNOSIS — D75839 Thrombocytosis, unspecified: Secondary | ICD-10-CM | POA: Diagnosis present

## 2020-07-11 DIAGNOSIS — Z7901 Long term (current) use of anticoagulants: Secondary | ICD-10-CM | POA: Diagnosis not present

## 2020-07-11 DIAGNOSIS — M609 Myositis, unspecified: Secondary | ICD-10-CM | POA: Diagnosis not present

## 2020-07-11 DIAGNOSIS — Z950 Presence of cardiac pacemaker: Secondary | ICD-10-CM | POA: Diagnosis not present

## 2020-07-11 DIAGNOSIS — E7800 Pure hypercholesterolemia, unspecified: Secondary | ICD-10-CM | POA: Diagnosis not present

## 2020-07-11 DIAGNOSIS — I4891 Unspecified atrial fibrillation: Secondary | ICD-10-CM | POA: Diagnosis not present

## 2020-07-11 DIAGNOSIS — Z96641 Presence of right artificial hip joint: Secondary | ICD-10-CM | POA: Diagnosis not present

## 2020-07-11 DIAGNOSIS — E538 Deficiency of other specified B group vitamins: Secondary | ICD-10-CM | POA: Diagnosis not present

## 2020-07-11 DIAGNOSIS — F32A Depression, unspecified: Secondary | ICD-10-CM | POA: Diagnosis not present

## 2020-07-11 DIAGNOSIS — I480 Paroxysmal atrial fibrillation: Secondary | ICD-10-CM | POA: Diagnosis present

## 2020-07-11 DIAGNOSIS — J45909 Unspecified asthma, uncomplicated: Secondary | ICD-10-CM | POA: Diagnosis present

## 2020-07-11 DIAGNOSIS — D509 Iron deficiency anemia, unspecified: Secondary | ICD-10-CM | POA: Diagnosis present

## 2020-07-11 DIAGNOSIS — Z66 Do not resuscitate: Secondary | ICD-10-CM | POA: Diagnosis present

## 2020-07-11 DIAGNOSIS — R2241 Localized swelling, mass and lump, right lower limb: Secondary | ICD-10-CM | POA: Diagnosis not present

## 2020-07-11 DIAGNOSIS — E440 Moderate protein-calorie malnutrition: Secondary | ICD-10-CM | POA: Diagnosis not present

## 2020-07-11 DIAGNOSIS — I959 Hypotension, unspecified: Secondary | ICD-10-CM | POA: Diagnosis not present

## 2020-07-11 DIAGNOSIS — E871 Hypo-osmolality and hyponatremia: Secondary | ICD-10-CM | POA: Diagnosis present

## 2020-07-11 DIAGNOSIS — Z743 Need for continuous supervision: Secondary | ICD-10-CM | POA: Diagnosis not present

## 2020-07-11 DIAGNOSIS — M62838 Other muscle spasm: Secondary | ICD-10-CM | POA: Diagnosis not present

## 2020-07-11 DIAGNOSIS — F411 Generalized anxiety disorder: Secondary | ICD-10-CM | POA: Diagnosis present

## 2020-07-11 LAB — CBC WITH DIFFERENTIAL/PLATELET
Abs Immature Granulocytes: 0.15 10*3/uL — ABNORMAL HIGH (ref 0.00–0.07)
Basophils Absolute: 0.1 10*3/uL (ref 0.0–0.1)
Basophils Relative: 1 %
Eosinophils Absolute: 0.4 10*3/uL (ref 0.0–0.5)
Eosinophils Relative: 4 %
HCT: 27.1 % — ABNORMAL LOW (ref 36.0–46.0)
Hemoglobin: 8.8 g/dL — ABNORMAL LOW (ref 12.0–15.0)
Immature Granulocytes: 2 %
Lymphocytes Relative: 13 %
Lymphs Abs: 1.3 10*3/uL (ref 0.7–4.0)
MCH: 26.6 pg (ref 26.0–34.0)
MCHC: 32.5 g/dL (ref 30.0–36.0)
MCV: 81.9 fL (ref 80.0–100.0)
Monocytes Absolute: 0.7 10*3/uL (ref 0.1–1.0)
Monocytes Relative: 8 %
Neutro Abs: 6.9 10*3/uL (ref 1.7–7.7)
Neutrophils Relative %: 72 %
Platelets: 596 10*3/uL — ABNORMAL HIGH (ref 150–400)
RBC: 3.31 MIL/uL — ABNORMAL LOW (ref 3.87–5.11)
RDW: 13.8 % (ref 11.5–15.5)
WBC: 9.5 10*3/uL (ref 4.0–10.5)
nRBC: 0 % (ref 0.0–0.2)

## 2020-07-11 LAB — BASIC METABOLIC PANEL
Anion gap: 6 (ref 5–15)
BUN: 26 mg/dL — ABNORMAL HIGH (ref 8–23)
CO2: 28 mmol/L (ref 22–32)
Calcium: 9.1 mg/dL (ref 8.9–10.3)
Chloride: 101 mmol/L (ref 98–111)
Creatinine, Ser: 0.73 mg/dL (ref 0.44–1.00)
GFR, Estimated: >60 mL/min (ref >60)
Glucose, Bld: 122 mg/dL — ABNORMAL HIGH (ref 70–99)
Potassium: 4.7 mmol/L (ref 3.5–5.1)
Sodium: 135 mmol/L (ref 135–145)

## 2020-07-11 NOTE — Discharge Planning (Signed)
IV x2 removed.  Discharge papers printed and placed in packet for transport to [place].  Report called and s/w Early Osmond, LPN.  EMS arrived and patient had 99.9 temp - asked if we could confirm with facility they would still take patient.  Receiving LPN informed and agreed.

## 2020-07-11 NOTE — TOC Transition Note (Signed)
Transition of Care [facility]) - CM/SW Discharge Note   Patient Details  Name: Michele Summers MRN: [id number] Date of Birth: 11/10/1931  [facility] CM/SW Contact:  Boris Sharper, LCSW Phone Number: 07/11/2020, 2:48 PM   Clinical Narrative:    Pt medically stable for discharge per MD. Pt will be transported to [facility] via EMS. CSW arranged transport and notified family of discharge. PT will be going to room 505.   Final next level of care: Skilled Nursing Facility Barriers to Discharge: No Barriers Identified   Patient Goals and CMS Choice Patient states their goals for this hospitalization and ongoing recovery are:: SNF rehab CMS Medicare.gov Compare Post Acute Care list provided to:: Patient Choice offered to / list presented to : [place]  Discharge Placement              Patient [facility] bed at: [facility] Patient to be transferred to facility by: [facility] Name of family member notified: Debbie Patient and family notified of of transfer: 07/11/20  Discharge Plan and Services                                     Social Determinants of Health (SDOH) Interventions     Readmission Risk Interventions No flowsheet data found.

## 2020-07-13 DIAGNOSIS — F411 Generalized anxiety disorder: Secondary | ICD-10-CM | POA: Diagnosis not present

## 2020-07-13 DIAGNOSIS — I4891 Unspecified atrial fibrillation: Secondary | ICD-10-CM | POA: Diagnosis not present

## 2020-07-13 DIAGNOSIS — I82411 Acute embolism and thrombosis of right femoral vein: Secondary | ICD-10-CM | POA: Diagnosis not present

## 2020-07-13 DIAGNOSIS — M609 Myositis, unspecified: Secondary | ICD-10-CM | POA: Diagnosis not present

## 2020-07-13 DIAGNOSIS — I5032 Chronic diastolic (congestive) heart failure: Secondary | ICD-10-CM | POA: Diagnosis not present

## 2020-07-22 DIAGNOSIS — M25551 Pain in right hip: Secondary | ICD-10-CM | POA: Diagnosis not present

## 2020-07-22 DIAGNOSIS — I5032 Chronic diastolic (congestive) heart failure: Secondary | ICD-10-CM | POA: Diagnosis not present

## 2020-07-22 DIAGNOSIS — Z20822 Contact with and (suspected) exposure to covid-19: Secondary | ICD-10-CM | POA: Diagnosis not present

## 2020-07-22 DIAGNOSIS — M79651 Pain in right thigh: Secondary | ICD-10-CM | POA: Diagnosis not present

## 2020-07-22 DIAGNOSIS — R509 Fever, unspecified: Secondary | ICD-10-CM | POA: Diagnosis not present

## 2020-07-22 DIAGNOSIS — R2241 Localized swelling, mass and lump, right lower limb: Secondary | ICD-10-CM | POA: Diagnosis not present

## 2020-07-22 DIAGNOSIS — I110 Hypertensive heart disease with heart failure: Secondary | ICD-10-CM | POA: Diagnosis not present

## 2020-07-22 DIAGNOSIS — I82411 Acute embolism and thrombosis of right femoral vein: Secondary | ICD-10-CM | POA: Diagnosis not present

## 2020-07-22 DIAGNOSIS — M60851 Other myositis, right thigh: Secondary | ICD-10-CM | POA: Diagnosis not present

## 2020-07-22 DIAGNOSIS — E871 Hypo-osmolality and hyponatremia: Secondary | ICD-10-CM | POA: Diagnosis not present

## 2020-07-22 DIAGNOSIS — F321 Major depressive disorder, single episode, moderate: Secondary | ICD-10-CM | POA: Diagnosis not present

## 2020-07-23 DIAGNOSIS — F32A Depression, unspecified: Secondary | ICD-10-CM | POA: Diagnosis not present

## 2020-07-23 DIAGNOSIS — M60851 Other myositis, right thigh: Secondary | ICD-10-CM | POA: Diagnosis not present

## 2020-07-23 DIAGNOSIS — D649 Anemia, unspecified: Secondary | ICD-10-CM | POA: Diagnosis not present

## 2020-07-23 DIAGNOSIS — E871 Hypo-osmolality and hyponatremia: Secondary | ICD-10-CM | POA: Diagnosis not present

## 2020-07-23 DIAGNOSIS — M89551 Osteolysis, right thigh: Secondary | ICD-10-CM | POA: Diagnosis not present

## 2020-07-23 DIAGNOSIS — Z96641 Presence of right artificial hip joint: Secondary | ICD-10-CM | POA: Diagnosis not present

## 2020-07-23 DIAGNOSIS — D75838 Other thrombocytosis: Secondary | ICD-10-CM | POA: Diagnosis not present

## 2020-07-23 DIAGNOSIS — M25551 Pain in right hip: Secondary | ICD-10-CM | POA: Diagnosis not present

## 2020-07-23 DIAGNOSIS — I82411 Acute embolism and thrombosis of right femoral vein: Secondary | ICD-10-CM | POA: Diagnosis not present

## 2020-07-23 DIAGNOSIS — F419 Anxiety disorder, unspecified: Secondary | ICD-10-CM | POA: Diagnosis not present

## 2020-07-23 DIAGNOSIS — D219 Benign neoplasm of connective and other soft tissue, unspecified: Secondary | ICD-10-CM | POA: Diagnosis not present

## 2020-07-24 DIAGNOSIS — I82411 Acute embolism and thrombosis of right femoral vein: Secondary | ICD-10-CM | POA: Diagnosis present

## 2020-07-24 DIAGNOSIS — F32A Depression, unspecified: Secondary | ICD-10-CM | POA: Diagnosis not present

## 2020-07-24 DIAGNOSIS — F411 Generalized anxiety disorder: Secondary | ICD-10-CM | POA: Diagnosis present

## 2020-07-24 DIAGNOSIS — I5032 Chronic diastolic (congestive) heart failure: Secondary | ICD-10-CM | POA: Diagnosis present

## 2020-07-24 DIAGNOSIS — E785 Hyperlipidemia, unspecified: Secondary | ICD-10-CM | POA: Diagnosis not present

## 2020-07-24 DIAGNOSIS — I5030 Unspecified diastolic (congestive) heart failure: Secondary | ICD-10-CM | POA: Diagnosis not present

## 2020-07-24 DIAGNOSIS — H409 Unspecified glaucoma: Secondary | ICD-10-CM | POA: Diagnosis present

## 2020-07-24 DIAGNOSIS — M609 Myositis, unspecified: Secondary | ICD-10-CM | POA: Diagnosis not present

## 2020-07-24 DIAGNOSIS — D219 Benign neoplasm of connective and other soft tissue, unspecified: Secondary | ICD-10-CM | POA: Diagnosis not present

## 2020-07-24 DIAGNOSIS — Z743 Need for continuous supervision: Secondary | ICD-10-CM | POA: Diagnosis not present

## 2020-07-24 DIAGNOSIS — D75839 Thrombocytosis, unspecified: Secondary | ICD-10-CM | POA: Diagnosis present

## 2020-07-24 DIAGNOSIS — M25551 Pain in right hip: Secondary | ICD-10-CM | POA: Diagnosis not present

## 2020-07-24 DIAGNOSIS — M6088 Other myositis, other site: Secondary | ICD-10-CM | POA: Diagnosis not present

## 2020-07-24 DIAGNOSIS — D75838 Other thrombocytosis: Secondary | ICD-10-CM | POA: Diagnosis not present

## 2020-07-24 DIAGNOSIS — Z20822 Contact with and (suspected) exposure to covid-19: Secondary | ICD-10-CM | POA: Diagnosis present

## 2020-07-24 DIAGNOSIS — M6281 Muscle weakness (generalized): Secondary | ICD-10-CM | POA: Diagnosis not present

## 2020-07-24 DIAGNOSIS — D649 Anemia, unspecified: Secondary | ICD-10-CM | POA: Diagnosis not present

## 2020-07-24 DIAGNOSIS — R2241 Localized swelling, mass and lump, right lower limb: Secondary | ICD-10-CM | POA: Diagnosis not present

## 2020-07-24 DIAGNOSIS — M6080 Other myositis, unspecified site: Secondary | ICD-10-CM | POA: Diagnosis not present

## 2020-07-24 DIAGNOSIS — M60851 Other myositis, right thigh: Secondary | ICD-10-CM | POA: Diagnosis present

## 2020-07-24 DIAGNOSIS — R531 Weakness: Secondary | ICD-10-CM | POA: Diagnosis not present

## 2020-07-24 DIAGNOSIS — D509 Iron deficiency anemia, unspecified: Secondary | ICD-10-CM | POA: Diagnosis present

## 2020-07-24 DIAGNOSIS — E569 Vitamin deficiency, unspecified: Secondary | ICD-10-CM | POA: Diagnosis not present

## 2020-07-24 DIAGNOSIS — I10 Essential (primary) hypertension: Secondary | ICD-10-CM | POA: Diagnosis not present

## 2020-07-24 DIAGNOSIS — Z8673 Personal history of transient ischemic attack (TIA), and cerebral infarction without residual deficits: Secondary | ICD-10-CM | POA: Diagnosis not present

## 2020-07-24 DIAGNOSIS — L89152 Pressure ulcer of sacral region, stage 2: Secondary | ICD-10-CM | POA: Diagnosis present

## 2020-07-24 DIAGNOSIS — Z885 Allergy status to narcotic agent status: Secondary | ICD-10-CM | POA: Diagnosis not present

## 2020-07-24 DIAGNOSIS — Z7901 Long term (current) use of anticoagulants: Secondary | ICD-10-CM | POA: Diagnosis not present

## 2020-07-24 DIAGNOSIS — K5900 Constipation, unspecified: Secondary | ICD-10-CM | POA: Diagnosis not present

## 2020-07-24 DIAGNOSIS — F321 Major depressive disorder, single episode, moderate: Secondary | ICD-10-CM | POA: Diagnosis present

## 2020-07-24 DIAGNOSIS — Z79899 Other long term (current) drug therapy: Secondary | ICD-10-CM | POA: Diagnosis not present

## 2020-07-24 DIAGNOSIS — Z86718 Personal history of other venous thrombosis and embolism: Secondary | ICD-10-CM | POA: Diagnosis not present

## 2020-07-24 DIAGNOSIS — R2681 Unsteadiness on feet: Secondary | ICD-10-CM | POA: Diagnosis not present

## 2020-07-24 DIAGNOSIS — Z882 Allergy status to sulfonamides status: Secondary | ICD-10-CM | POA: Diagnosis not present

## 2020-07-24 DIAGNOSIS — Z66 Do not resuscitate: Secondary | ICD-10-CM | POA: Diagnosis present

## 2020-07-24 DIAGNOSIS — I110 Hypertensive heart disease with heart failure: Secondary | ICD-10-CM | POA: Diagnosis present

## 2020-07-24 DIAGNOSIS — F419 Anxiety disorder, unspecified: Secondary | ICD-10-CM | POA: Diagnosis not present

## 2020-07-24 DIAGNOSIS — E871 Hypo-osmolality and hyponatremia: Secondary | ICD-10-CM | POA: Diagnosis present

## 2020-07-24 DIAGNOSIS — D638 Anemia in other chronic diseases classified elsewhere: Secondary | ICD-10-CM | POA: Diagnosis present

## 2020-07-24 DIAGNOSIS — M6250 Muscle wasting and atrophy, not elsewhere classified, unspecified site: Secondary | ICD-10-CM | POA: Diagnosis not present

## 2020-07-24 DIAGNOSIS — K219 Gastro-esophageal reflux disease without esophagitis: Secondary | ICD-10-CM | POA: Diagnosis not present

## 2020-07-24 DIAGNOSIS — J45909 Unspecified asthma, uncomplicated: Secondary | ICD-10-CM | POA: Diagnosis present

## 2020-07-24 DIAGNOSIS — I480 Paroxysmal atrial fibrillation: Secondary | ICD-10-CM | POA: Diagnosis present

## 2020-07-24 DIAGNOSIS — Z96641 Presence of right artificial hip joint: Secondary | ICD-10-CM | POA: Diagnosis not present

## 2020-07-24 DIAGNOSIS — G4700 Insomnia, unspecified: Secondary | ICD-10-CM | POA: Diagnosis present

## 2020-07-24 DIAGNOSIS — Z950 Presence of cardiac pacemaker: Secondary | ICD-10-CM | POA: Diagnosis not present

## 2020-07-31 LAB — FUNGUS CULTURE WITH STAIN

## 2020-07-31 LAB — FUNGAL ORGANISM REFLEX

## 2020-07-31 LAB — FUNGUS CULTURE RESULT

## 2020-08-01 ENCOUNTER — Other Ambulatory Visit: Payer: Self-pay | Admitting: Internal Medicine

## 2020-08-01 DIAGNOSIS — E875 Hyperkalemia: Secondary | ICD-10-CM

## 2020-08-07 DIAGNOSIS — R61 Generalized hyperhidrosis: Secondary | ICD-10-CM | POA: Diagnosis not present

## 2020-08-07 DIAGNOSIS — Z86718 Personal history of other venous thrombosis and embolism: Secondary | ICD-10-CM | POA: Diagnosis not present

## 2020-08-07 DIAGNOSIS — I82411 Acute embolism and thrombosis of right femoral vein: Secondary | ICD-10-CM | POA: Diagnosis present

## 2020-08-07 DIAGNOSIS — S7224XA Nondisplaced subtrochanteric fracture of right femur, initial encounter for closed fracture: Secondary | ICD-10-CM | POA: Diagnosis not present

## 2020-08-07 DIAGNOSIS — Z96641 Presence of right artificial hip joint: Secondary | ICD-10-CM | POA: Diagnosis not present

## 2020-08-07 DIAGNOSIS — I5032 Chronic diastolic (congestive) heart failure: Secondary | ICD-10-CM | POA: Diagnosis present

## 2020-08-07 DIAGNOSIS — R0902 Hypoxemia: Secondary | ICD-10-CM | POA: Diagnosis not present

## 2020-08-07 DIAGNOSIS — E569 Vitamin deficiency, unspecified: Secondary | ICD-10-CM | POA: Diagnosis not present

## 2020-08-07 DIAGNOSIS — R0689 Other abnormalities of breathing: Secondary | ICD-10-CM | POA: Diagnosis not present

## 2020-08-07 DIAGNOSIS — R112 Nausea with vomiting, unspecified: Secondary | ICD-10-CM | POA: Diagnosis not present

## 2020-08-07 DIAGNOSIS — L02419 Cutaneous abscess of limb, unspecified: Secondary | ICD-10-CM | POA: Diagnosis not present

## 2020-08-07 DIAGNOSIS — N12 Tubulo-interstitial nephritis, not specified as acute or chronic: Secondary | ICD-10-CM | POA: Diagnosis not present

## 2020-08-07 DIAGNOSIS — I10 Essential (primary) hypertension: Secondary | ICD-10-CM | POA: Diagnosis not present

## 2020-08-07 DIAGNOSIS — Z743 Need for continuous supervision: Secondary | ICD-10-CM | POA: Diagnosis not present

## 2020-08-07 DIAGNOSIS — J45909 Unspecified asthma, uncomplicated: Secondary | ICD-10-CM | POA: Diagnosis not present

## 2020-08-07 DIAGNOSIS — Z20822 Contact with and (suspected) exposure to covid-19: Secondary | ICD-10-CM | POA: Diagnosis present

## 2020-08-07 DIAGNOSIS — C4921 Malignant neoplasm of connective and soft tissue of right lower limb, including hip: Secondary | ICD-10-CM | POA: Diagnosis present

## 2020-08-07 DIAGNOSIS — E876 Hypokalemia: Secondary | ICD-10-CM | POA: Diagnosis present

## 2020-08-07 DIAGNOSIS — M898X5 Other specified disorders of bone, thigh: Secondary | ICD-10-CM | POA: Diagnosis not present

## 2020-08-07 DIAGNOSIS — T8119XA Other postprocedural shock, initial encounter: Secondary | ICD-10-CM | POA: Diagnosis not present

## 2020-08-07 DIAGNOSIS — J90 Pleural effusion, not elsewhere classified: Secondary | ICD-10-CM | POA: Diagnosis not present

## 2020-08-07 DIAGNOSIS — E872 Acidosis: Secondary | ICD-10-CM | POA: Diagnosis present

## 2020-08-07 DIAGNOSIS — D75839 Thrombocytosis, unspecified: Secondary | ICD-10-CM | POA: Diagnosis not present

## 2020-08-07 DIAGNOSIS — Z8673 Personal history of transient ischemic attack (TIA), and cerebral infarction without residual deficits: Secondary | ICD-10-CM | POA: Diagnosis not present

## 2020-08-07 DIAGNOSIS — R54 Age-related physical debility: Secondary | ICD-10-CM | POA: Diagnosis present

## 2020-08-07 DIAGNOSIS — R1111 Vomiting without nausea: Secondary | ICD-10-CM | POA: Diagnosis not present

## 2020-08-07 DIAGNOSIS — M9701XA Periprosthetic fracture around internal prosthetic right hip joint, initial encounter: Secondary | ICD-10-CM | POA: Diagnosis present

## 2020-08-07 DIAGNOSIS — D649 Anemia, unspecified: Secondary | ICD-10-CM | POA: Diagnosis not present

## 2020-08-07 DIAGNOSIS — N3000 Acute cystitis without hematuria: Secondary | ICD-10-CM | POA: Diagnosis not present

## 2020-08-07 DIAGNOSIS — I97621 Postprocedural hematoma of a circulatory system organ or structure following other procedure: Secondary | ICD-10-CM | POA: Diagnosis not present

## 2020-08-07 DIAGNOSIS — R531 Weakness: Secondary | ICD-10-CM | POA: Diagnosis not present

## 2020-08-07 DIAGNOSIS — M6080 Other myositis, unspecified site: Secondary | ICD-10-CM | POA: Diagnosis not present

## 2020-08-07 DIAGNOSIS — K5730 Diverticulosis of large intestine without perforation or abscess without bleeding: Secondary | ICD-10-CM | POA: Diagnosis not present

## 2020-08-07 DIAGNOSIS — R2241 Localized swelling, mass and lump, right lower limb: Secondary | ICD-10-CM | POA: Diagnosis not present

## 2020-08-07 DIAGNOSIS — G8911 Acute pain due to trauma: Secondary | ICD-10-CM | POA: Diagnosis not present

## 2020-08-07 DIAGNOSIS — K219 Gastro-esophageal reflux disease without esophagitis: Secondary | ICD-10-CM | POA: Diagnosis not present

## 2020-08-07 DIAGNOSIS — T84050A Periprosthetic osteolysis of internal prosthetic right hip joint, initial encounter: Secondary | ICD-10-CM | POA: Diagnosis present

## 2020-08-07 DIAGNOSIS — F05 Delirium due to known physiological condition: Secondary | ICD-10-CM | POA: Diagnosis not present

## 2020-08-07 DIAGNOSIS — F32A Depression, unspecified: Secondary | ICD-10-CM | POA: Diagnosis not present

## 2020-08-07 DIAGNOSIS — R079 Chest pain, unspecified: Secondary | ICD-10-CM | POA: Diagnosis not present

## 2020-08-07 DIAGNOSIS — N179 Acute kidney failure, unspecified: Secondary | ICD-10-CM | POA: Diagnosis not present

## 2020-08-07 DIAGNOSIS — M6281 Muscle weakness (generalized): Secondary | ICD-10-CM | POA: Diagnosis not present

## 2020-08-07 DIAGNOSIS — M724 Pseudosarcomatous fibromatosis: Secondary | ICD-10-CM | POA: Diagnosis not present

## 2020-08-07 DIAGNOSIS — M6250 Muscle wasting and atrophy, not elsewhere classified, unspecified site: Secondary | ICD-10-CM | POA: Diagnosis not present

## 2020-08-07 DIAGNOSIS — K5900 Constipation, unspecified: Secondary | ICD-10-CM | POA: Diagnosis not present

## 2020-08-07 DIAGNOSIS — I471 Supraventricular tachycardia: Secondary | ICD-10-CM | POA: Diagnosis not present

## 2020-08-07 DIAGNOSIS — I110 Hypertensive heart disease with heart failure: Secondary | ICD-10-CM | POA: Diagnosis present

## 2020-08-07 DIAGNOSIS — E871 Hypo-osmolality and hyponatremia: Secondary | ICD-10-CM | POA: Diagnosis present

## 2020-08-07 DIAGNOSIS — S72101A Unspecified trochanteric fracture of right femur, initial encounter for closed fracture: Secondary | ICD-10-CM | POA: Diagnosis present

## 2020-08-07 DIAGNOSIS — L89152 Pressure ulcer of sacral region, stage 2: Secondary | ICD-10-CM | POA: Diagnosis present

## 2020-08-07 DIAGNOSIS — G894 Chronic pain syndrome: Secondary | ICD-10-CM | POA: Diagnosis not present

## 2020-08-07 DIAGNOSIS — E785 Hyperlipidemia, unspecified: Secondary | ICD-10-CM | POA: Diagnosis not present

## 2020-08-07 DIAGNOSIS — M609 Myositis, unspecified: Secondary | ICD-10-CM | POA: Diagnosis not present

## 2020-08-07 DIAGNOSIS — Z515 Encounter for palliative care: Secondary | ICD-10-CM | POA: Diagnosis not present

## 2020-08-07 DIAGNOSIS — Z66 Do not resuscitate: Secondary | ICD-10-CM | POA: Diagnosis present

## 2020-08-07 DIAGNOSIS — I495 Sick sinus syndrome: Secondary | ICD-10-CM | POA: Diagnosis present

## 2020-08-07 DIAGNOSIS — E46 Unspecified protein-calorie malnutrition: Secondary | ICD-10-CM | POA: Diagnosis present

## 2020-08-07 DIAGNOSIS — R2681 Unsteadiness on feet: Secondary | ICD-10-CM | POA: Diagnosis not present

## 2020-08-07 DIAGNOSIS — M25551 Pain in right hip: Secondary | ICD-10-CM | POA: Diagnosis not present

## 2020-08-07 DIAGNOSIS — G4700 Insomnia, unspecified: Secondary | ICD-10-CM | POA: Diagnosis not present

## 2020-08-07 DIAGNOSIS — I480 Paroxysmal atrial fibrillation: Secondary | ICD-10-CM | POA: Diagnosis not present

## 2020-08-07 DIAGNOSIS — D509 Iron deficiency anemia, unspecified: Secondary | ICD-10-CM | POA: Diagnosis not present

## 2020-08-10 DIAGNOSIS — I10 Essential (primary) hypertension: Secondary | ICD-10-CM | POA: Diagnosis not present

## 2020-08-10 DIAGNOSIS — G8911 Acute pain due to trauma: Secondary | ICD-10-CM | POA: Diagnosis not present

## 2020-08-10 DIAGNOSIS — I480 Paroxysmal atrial fibrillation: Secondary | ICD-10-CM | POA: Diagnosis not present

## 2020-08-10 DIAGNOSIS — G894 Chronic pain syndrome: Secondary | ICD-10-CM | POA: Diagnosis not present

## 2020-08-10 DIAGNOSIS — R112 Nausea with vomiting, unspecified: Secondary | ICD-10-CM | POA: Diagnosis not present

## 2020-08-11 DIAGNOSIS — Z452 Encounter for adjustment and management of vascular access device: Secondary | ICD-10-CM | POA: Diagnosis not present

## 2020-08-11 DIAGNOSIS — F32A Depression, unspecified: Secondary | ICD-10-CM | POA: Diagnosis not present

## 2020-08-11 DIAGNOSIS — N12 Tubulo-interstitial nephritis, not specified as acute or chronic: Secondary | ICD-10-CM | POA: Diagnosis not present

## 2020-08-11 DIAGNOSIS — C801 Malignant (primary) neoplasm, unspecified: Secondary | ICD-10-CM | POA: Diagnosis not present

## 2020-08-11 DIAGNOSIS — R5381 Other malaise: Secondary | ICD-10-CM | POA: Diagnosis not present

## 2020-08-11 DIAGNOSIS — I480 Paroxysmal atrial fibrillation: Secondary | ICD-10-CM | POA: Diagnosis present

## 2020-08-11 DIAGNOSIS — S72101A Unspecified trochanteric fracture of right femur, initial encounter for closed fracture: Secondary | ICD-10-CM | POA: Diagnosis present

## 2020-08-11 DIAGNOSIS — M899 Disorder of bone, unspecified: Secondary | ICD-10-CM | POA: Diagnosis not present

## 2020-08-11 DIAGNOSIS — G8911 Acute pain due to trauma: Secondary | ICD-10-CM | POA: Diagnosis not present

## 2020-08-11 DIAGNOSIS — S72051A Unspecified fracture of head of right femur, initial encounter for closed fracture: Secondary | ICD-10-CM | POA: Diagnosis not present

## 2020-08-11 DIAGNOSIS — H409 Unspecified glaucoma: Secondary | ICD-10-CM | POA: Diagnosis not present

## 2020-08-11 DIAGNOSIS — I82511 Chronic embolism and thrombosis of right femoral vein: Secondary | ICD-10-CM | POA: Diagnosis not present

## 2020-08-11 DIAGNOSIS — R933 Abnormal findings on diagnostic imaging of other parts of digestive tract: Secondary | ICD-10-CM | POA: Diagnosis not present

## 2020-08-11 DIAGNOSIS — R29818 Other symptoms and signs involving the nervous system: Secondary | ICD-10-CM | POA: Diagnosis not present

## 2020-08-11 DIAGNOSIS — I110 Hypertensive heart disease with heart failure: Secondary | ICD-10-CM | POA: Diagnosis present

## 2020-08-11 DIAGNOSIS — S7011XA Contusion of right thigh, initial encounter: Secondary | ICD-10-CM | POA: Diagnosis not present

## 2020-08-11 DIAGNOSIS — I4891 Unspecified atrial fibrillation: Secondary | ICD-10-CM | POA: Diagnosis not present

## 2020-08-11 DIAGNOSIS — S72001A Fracture of unspecified part of neck of right femur, initial encounter for closed fracture: Secondary | ICD-10-CM | POA: Diagnosis not present

## 2020-08-11 DIAGNOSIS — R0602 Shortness of breath: Secondary | ICD-10-CM | POA: Diagnosis not present

## 2020-08-11 DIAGNOSIS — R1030 Lower abdominal pain, unspecified: Secondary | ICD-10-CM | POA: Diagnosis not present

## 2020-08-11 DIAGNOSIS — G4700 Insomnia, unspecified: Secondary | ICD-10-CM | POA: Diagnosis not present

## 2020-08-11 DIAGNOSIS — S300XXA Contusion of lower back and pelvis, initial encounter: Secondary | ICD-10-CM | POA: Diagnosis not present

## 2020-08-11 DIAGNOSIS — D649 Anemia, unspecified: Secondary | ICD-10-CM | POA: Diagnosis not present

## 2020-08-11 DIAGNOSIS — T84050A Periprosthetic osteolysis of internal prosthetic right hip joint, initial encounter: Secondary | ICD-10-CM | POA: Diagnosis present

## 2020-08-11 DIAGNOSIS — E872 Acidosis: Secondary | ICD-10-CM | POA: Diagnosis present

## 2020-08-11 DIAGNOSIS — G8918 Other acute postprocedural pain: Secondary | ICD-10-CM | POA: Diagnosis not present

## 2020-08-11 DIAGNOSIS — I4892 Unspecified atrial flutter: Secondary | ICD-10-CM | POA: Diagnosis not present

## 2020-08-11 DIAGNOSIS — G5721 Lesion of femoral nerve, right lower limb: Secondary | ICD-10-CM | POA: Diagnosis not present

## 2020-08-11 DIAGNOSIS — Z20822 Contact with and (suspected) exposure to covid-19: Secondary | ICD-10-CM | POA: Diagnosis present

## 2020-08-11 DIAGNOSIS — N3941 Urge incontinence: Secondary | ICD-10-CM | POA: Diagnosis not present

## 2020-08-11 DIAGNOSIS — J90 Pleural effusion, not elsewhere classified: Secondary | ICD-10-CM | POA: Diagnosis not present

## 2020-08-11 DIAGNOSIS — Q6100 Congenital renal cyst, unspecified: Secondary | ICD-10-CM | POA: Diagnosis not present

## 2020-08-11 DIAGNOSIS — E46 Unspecified protein-calorie malnutrition: Secondary | ICD-10-CM | POA: Diagnosis present

## 2020-08-11 DIAGNOSIS — T8119XA Other postprocedural shock, initial encounter: Secondary | ICD-10-CM | POA: Diagnosis not present

## 2020-08-11 DIAGNOSIS — G893 Neoplasm related pain (acute) (chronic): Secondary | ICD-10-CM | POA: Diagnosis not present

## 2020-08-11 DIAGNOSIS — I288 Other diseases of pulmonary vessels: Secondary | ICD-10-CM | POA: Diagnosis not present

## 2020-08-11 DIAGNOSIS — M60851 Other myositis, right thigh: Secondary | ICD-10-CM | POA: Diagnosis not present

## 2020-08-11 DIAGNOSIS — D62 Acute posthemorrhagic anemia: Secondary | ICD-10-CM | POA: Diagnosis not present

## 2020-08-11 DIAGNOSIS — Z9889 Other specified postprocedural states: Secondary | ICD-10-CM | POA: Diagnosis not present

## 2020-08-11 DIAGNOSIS — K5903 Drug induced constipation: Secondary | ICD-10-CM | POA: Diagnosis not present

## 2020-08-11 DIAGNOSIS — R112 Nausea with vomiting, unspecified: Secondary | ICD-10-CM | POA: Diagnosis not present

## 2020-08-11 DIAGNOSIS — E876 Hypokalemia: Secondary | ICD-10-CM | POA: Diagnosis present

## 2020-08-11 DIAGNOSIS — I517 Cardiomegaly: Secondary | ICD-10-CM | POA: Diagnosis not present

## 2020-08-11 DIAGNOSIS — C499 Malignant neoplasm of connective and soft tissue, unspecified: Secondary | ICD-10-CM | POA: Diagnosis not present

## 2020-08-11 DIAGNOSIS — R2681 Unsteadiness on feet: Secondary | ICD-10-CM | POA: Diagnosis not present

## 2020-08-11 DIAGNOSIS — I5032 Chronic diastolic (congestive) heart failure: Secondary | ICD-10-CM | POA: Diagnosis present

## 2020-08-11 DIAGNOSIS — L02419 Cutaneous abscess of limb, unspecified: Secondary | ICD-10-CM | POA: Diagnosis not present

## 2020-08-11 DIAGNOSIS — M9701XA Periprosthetic fracture around internal prosthetic right hip joint, initial encounter: Secondary | ICD-10-CM | POA: Diagnosis present

## 2020-08-11 DIAGNOSIS — R079 Chest pain, unspecified: Secondary | ICD-10-CM | POA: Diagnosis not present

## 2020-08-11 DIAGNOSIS — R279 Unspecified lack of coordination: Secondary | ICD-10-CM | POA: Diagnosis not present

## 2020-08-11 DIAGNOSIS — M9701XS Periprosthetic fracture around internal prosthetic right hip joint, sequela: Secondary | ICD-10-CM | POA: Diagnosis not present

## 2020-08-11 DIAGNOSIS — Z7901 Long term (current) use of anticoagulants: Secondary | ICD-10-CM | POA: Diagnosis not present

## 2020-08-11 DIAGNOSIS — N179 Acute kidney failure, unspecified: Secondary | ICD-10-CM | POA: Diagnosis not present

## 2020-08-11 DIAGNOSIS — M6281 Muscle weakness (generalized): Secondary | ICD-10-CM | POA: Diagnosis not present

## 2020-08-11 DIAGNOSIS — R410 Disorientation, unspecified: Secondary | ICD-10-CM | POA: Diagnosis not present

## 2020-08-11 DIAGNOSIS — J811 Chronic pulmonary edema: Secondary | ICD-10-CM | POA: Diagnosis not present

## 2020-08-11 DIAGNOSIS — C4920 Malignant neoplasm of connective and soft tissue of unspecified lower limb, including hip: Secondary | ICD-10-CM | POA: Diagnosis not present

## 2020-08-11 DIAGNOSIS — M724 Pseudosarcomatous fibromatosis: Secondary | ICD-10-CM | POA: Diagnosis not present

## 2020-08-11 DIAGNOSIS — G8929 Other chronic pain: Secondary | ICD-10-CM | POA: Diagnosis not present

## 2020-08-11 DIAGNOSIS — M6088 Other myositis, other site: Secondary | ICD-10-CM | POA: Diagnosis not present

## 2020-08-11 DIAGNOSIS — N390 Urinary tract infection, site not specified: Secondary | ICD-10-CM | POA: Diagnosis not present

## 2020-08-11 DIAGNOSIS — I82411 Acute embolism and thrombosis of right femoral vein: Secondary | ICD-10-CM | POA: Diagnosis present

## 2020-08-11 DIAGNOSIS — I482 Chronic atrial fibrillation, unspecified: Secondary | ICD-10-CM | POA: Diagnosis not present

## 2020-08-11 DIAGNOSIS — I471 Supraventricular tachycardia: Secondary | ICD-10-CM | POA: Diagnosis not present

## 2020-08-11 DIAGNOSIS — M898X5 Other specified disorders of bone, thigh: Secondary | ICD-10-CM | POA: Diagnosis not present

## 2020-08-11 DIAGNOSIS — I10 Essential (primary) hypertension: Secondary | ICD-10-CM | POA: Diagnosis not present

## 2020-08-11 DIAGNOSIS — I82401 Acute embolism and thrombosis of unspecified deep veins of right lower extremity: Secondary | ICD-10-CM | POA: Diagnosis not present

## 2020-08-11 DIAGNOSIS — R451 Restlessness and agitation: Secondary | ICD-10-CM | POA: Diagnosis not present

## 2020-08-11 DIAGNOSIS — D509 Iron deficiency anemia, unspecified: Secondary | ICD-10-CM | POA: Diagnosis not present

## 2020-08-11 DIAGNOSIS — M79604 Pain in right leg: Secondary | ICD-10-CM | POA: Diagnosis not present

## 2020-08-11 DIAGNOSIS — Z515 Encounter for palliative care: Secondary | ICD-10-CM | POA: Diagnosis not present

## 2020-08-11 DIAGNOSIS — K219 Gastro-esophageal reflux disease without esophagitis: Secondary | ICD-10-CM | POA: Diagnosis not present

## 2020-08-11 DIAGNOSIS — R61 Generalized hyperhidrosis: Secondary | ICD-10-CM | POA: Diagnosis not present

## 2020-08-11 DIAGNOSIS — R54 Age-related physical debility: Secondary | ICD-10-CM | POA: Diagnosis present

## 2020-08-11 DIAGNOSIS — I96 Gangrene, not elsewhere classified: Secondary | ICD-10-CM | POA: Diagnosis not present

## 2020-08-11 DIAGNOSIS — Z8673 Personal history of transient ischemic attack (TIA), and cerebral infarction without residual deficits: Secondary | ICD-10-CM | POA: Diagnosis not present

## 2020-08-11 DIAGNOSIS — R578 Other shock: Secondary | ICD-10-CM | POA: Diagnosis not present

## 2020-08-11 DIAGNOSIS — S7224XA Nondisplaced subtrochanteric fracture of right femur, initial encounter for closed fracture: Secondary | ICD-10-CM | POA: Diagnosis not present

## 2020-08-11 DIAGNOSIS — C4921 Malignant neoplasm of connective and soft tissue of right lower limb, including hip: Secondary | ICD-10-CM | POA: Diagnosis present

## 2020-08-11 DIAGNOSIS — I97621 Postprocedural hematoma of a circulatory system organ or structure following other procedure: Secondary | ICD-10-CM | POA: Diagnosis not present

## 2020-08-11 DIAGNOSIS — R944 Abnormal results of kidney function studies: Secondary | ICD-10-CM | POA: Diagnosis not present

## 2020-08-11 DIAGNOSIS — F05 Delirium due to known physiological condition: Secondary | ICD-10-CM | POA: Diagnosis not present

## 2020-08-11 DIAGNOSIS — S72111A Displaced fracture of greater trochanter of right femur, initial encounter for closed fracture: Secondary | ICD-10-CM | POA: Diagnosis not present

## 2020-08-11 DIAGNOSIS — E871 Hypo-osmolality and hyponatremia: Secondary | ICD-10-CM | POA: Diagnosis present

## 2020-08-11 DIAGNOSIS — M6080 Other myositis, unspecified site: Secondary | ICD-10-CM | POA: Diagnosis not present

## 2020-08-11 DIAGNOSIS — M609 Myositis, unspecified: Secondary | ICD-10-CM | POA: Diagnosis not present

## 2020-08-11 DIAGNOSIS — R0902 Hypoxemia: Secondary | ICD-10-CM | POA: Diagnosis not present

## 2020-08-11 DIAGNOSIS — E860 Dehydration: Secondary | ICD-10-CM | POA: Diagnosis not present

## 2020-08-11 DIAGNOSIS — L89152 Pressure ulcer of sacral region, stage 2: Secondary | ICD-10-CM | POA: Diagnosis present

## 2020-08-11 DIAGNOSIS — G629 Polyneuropathy, unspecified: Secondary | ICD-10-CM | POA: Diagnosis not present

## 2020-08-11 DIAGNOSIS — I495 Sick sinus syndrome: Secondary | ICD-10-CM | POA: Diagnosis present

## 2020-08-11 DIAGNOSIS — R0689 Other abnormalities of breathing: Secondary | ICD-10-CM | POA: Diagnosis not present

## 2020-08-11 DIAGNOSIS — R110 Nausea: Secondary | ICD-10-CM | POA: Diagnosis not present

## 2020-08-11 DIAGNOSIS — N3000 Acute cystitis without hematuria: Secondary | ICD-10-CM | POA: Diagnosis present

## 2020-08-11 DIAGNOSIS — K5730 Diverticulosis of large intestine without perforation or abscess without bleeding: Secondary | ICD-10-CM | POA: Diagnosis not present

## 2020-08-11 DIAGNOSIS — J9811 Atelectasis: Secondary | ICD-10-CM | POA: Diagnosis not present

## 2020-08-11 DIAGNOSIS — M1990 Unspecified osteoarthritis, unspecified site: Secondary | ICD-10-CM | POA: Diagnosis not present

## 2020-08-11 DIAGNOSIS — J439 Emphysema, unspecified: Secondary | ICD-10-CM | POA: Diagnosis not present

## 2020-08-11 DIAGNOSIS — M25551 Pain in right hip: Secondary | ICD-10-CM | POA: Diagnosis not present

## 2020-08-11 DIAGNOSIS — Z96641 Presence of right artificial hip joint: Secondary | ICD-10-CM | POA: Diagnosis not present

## 2020-08-11 DIAGNOSIS — Z950 Presence of cardiac pacemaker: Secondary | ICD-10-CM | POA: Diagnosis not present

## 2020-08-11 DIAGNOSIS — R059 Cough, unspecified: Secondary | ICD-10-CM | POA: Diagnosis not present

## 2020-08-11 DIAGNOSIS — E785 Hyperlipidemia, unspecified: Secondary | ICD-10-CM | POA: Diagnosis not present

## 2020-08-11 DIAGNOSIS — Z8679 Personal history of other diseases of the circulatory system: Secondary | ICD-10-CM | POA: Diagnosis not present

## 2020-08-11 DIAGNOSIS — R0600 Dyspnea, unspecified: Secondary | ICD-10-CM | POA: Diagnosis not present

## 2020-08-11 DIAGNOSIS — R918 Other nonspecific abnormal finding of lung field: Secondary | ICD-10-CM | POA: Diagnosis not present

## 2020-08-11 DIAGNOSIS — Z66 Do not resuscitate: Secondary | ICD-10-CM | POA: Diagnosis present

## 2020-08-11 DIAGNOSIS — R1111 Vomiting without nausea: Secondary | ICD-10-CM | POA: Diagnosis not present

## 2020-08-24 ENCOUNTER — Telehealth: Payer: Self-pay

## 2020-08-24 NOTE — Telephone Encounter (Signed)
Spoke with patient's daughter regarding her nephrology referral. Patient is currently in the hospital so they are not able to be seen at the moment.

## 2020-09-02 NOTE — Telephone Encounter (Signed)
error 

## 2020-09-07 ENCOUNTER — Ambulatory Visit: Payer: Medicare Other | Admitting: Hospice and Palliative Medicine

## 2020-10-14 DEATH — deceased

## 2021-02-16 ENCOUNTER — Ambulatory Visit: Payer: Medicare Other | Admitting: Internal Medicine

## 2021-03-02 ENCOUNTER — Ambulatory Visit: Payer: Medicare Other | Admitting: Nurse Practitioner
